# Patient Record
Sex: Female | Born: 1935 | Race: White | Hispanic: No | Smoking: Former smoker
Health system: Southern US, Community
[De-identification: ages and names within clinical notes are randomized; demographics above are authoritative.]

## PROBLEM LIST (undated history)

## (undated) DIAGNOSIS — I2510 Atherosclerotic heart disease of native coronary artery without angina pectoris: Secondary | ICD-10-CM

## (undated) DIAGNOSIS — I739 Peripheral vascular disease, unspecified: Secondary | ICD-10-CM

## (undated) DIAGNOSIS — Z9981 Dependence on supplemental oxygen: Secondary | ICD-10-CM

## (undated) DIAGNOSIS — M549 Dorsalgia, unspecified: Secondary | ICD-10-CM

## (undated) DIAGNOSIS — F329 Major depressive disorder, single episode, unspecified: Secondary | ICD-10-CM

## (undated) DIAGNOSIS — F32A Depression, unspecified: Secondary | ICD-10-CM

## (undated) DIAGNOSIS — R0789 Other chest pain: Secondary | ICD-10-CM

## (undated) DIAGNOSIS — I10 Essential (primary) hypertension: Secondary | ICD-10-CM

## (undated) DIAGNOSIS — C4492 Squamous cell carcinoma of skin, unspecified: Secondary | ICD-10-CM

## (undated) DIAGNOSIS — R413 Other amnesia: Secondary | ICD-10-CM

## (undated) DIAGNOSIS — E785 Hyperlipidemia, unspecified: Secondary | ICD-10-CM

## (undated) DIAGNOSIS — E039 Hypothyroidism, unspecified: Secondary | ICD-10-CM

## (undated) DIAGNOSIS — E119 Type 2 diabetes mellitus without complications: Secondary | ICD-10-CM

## (undated) DIAGNOSIS — R296 Repeated falls: Secondary | ICD-10-CM

## (undated) DIAGNOSIS — E1140 Type 2 diabetes mellitus with diabetic neuropathy, unspecified: Secondary | ICD-10-CM

## (undated) DIAGNOSIS — R410 Disorientation, unspecified: Secondary | ICD-10-CM

## (undated) DIAGNOSIS — K279 Peptic ulcer, site unspecified, unspecified as acute or chronic, without hemorrhage or perforation: Secondary | ICD-10-CM

## (undated) DIAGNOSIS — M1990 Unspecified osteoarthritis, unspecified site: Secondary | ICD-10-CM

## (undated) HISTORY — DX: Atherosclerotic heart disease of native coronary artery without angina pectoris: I25.10

## (undated) HISTORY — DX: Other chest pain: R07.89

## (undated) HISTORY — DX: Squamous cell carcinoma of skin, unspecified: C44.92

## (undated) HISTORY — DX: Type 2 diabetes mellitus without complications: E11.9

## (undated) HISTORY — DX: Peripheral vascular disease, unspecified: I73.9

## (undated) HISTORY — DX: Peptic ulcer, site unspecified, unspecified as acute or chronic, without hemorrhage or perforation: K27.9

## (undated) HISTORY — PX: CARDIAC CATHETERIZATION: SHX172

## (undated) HISTORY — PX: APPENDECTOMY: SHX54

## (undated) HISTORY — DX: Hyperlipidemia, unspecified: E78.5

## (undated) HISTORY — DX: Dorsalgia, unspecified: M54.9

## (undated) HISTORY — DX: Essential (primary) hypertension: I10

## (undated) HISTORY — DX: Type 2 diabetes mellitus with diabetic neuropathy, unspecified: E11.40

## (undated) HISTORY — PX: TONSILLECTOMY: SUR1361

## (undated) HISTORY — DX: Hypothyroidism, unspecified: E03.9

## (undated) HISTORY — PX: CATARACT EXTRACTION: SUR2

## (undated) HISTORY — DX: Major depressive disorder, single episode, unspecified: F32.9

## (undated) HISTORY — DX: Depression, unspecified: F32.A

---

## 1999-07-25 HISTORY — PX: CORONARY ARTERY BYPASS GRAFT: SHX141

## 2000-03-28 ENCOUNTER — Emergency Department (HOSPITAL_COMMUNITY): Admission: EM | Admit: 2000-03-28 | Discharge: 2000-03-28 | Payer: Self-pay | Admitting: Emergency Medicine

## 2000-03-28 ENCOUNTER — Encounter: Payer: Self-pay | Admitting: Emergency Medicine

## 2000-04-11 ENCOUNTER — Other Ambulatory Visit: Admission: RE | Admit: 2000-04-11 | Discharge: 2000-04-11 | Payer: Self-pay | Admitting: Obstetrics & Gynecology

## 2001-05-27 ENCOUNTER — Other Ambulatory Visit: Admission: RE | Admit: 2001-05-27 | Discharge: 2001-05-27 | Payer: Self-pay | Admitting: Obstetrics & Gynecology

## 2001-06-26 ENCOUNTER — Ambulatory Visit (HOSPITAL_COMMUNITY): Admission: RE | Admit: 2001-06-26 | Discharge: 2001-06-27 | Payer: Self-pay | Admitting: Cardiovascular Disease

## 2001-07-04 ENCOUNTER — Encounter: Payer: Self-pay | Admitting: Emergency Medicine

## 2001-07-04 ENCOUNTER — Inpatient Hospital Stay (HOSPITAL_COMMUNITY): Admission: EM | Admit: 2001-07-04 | Discharge: 2001-07-09 | Payer: Self-pay | Admitting: Emergency Medicine

## 2002-07-24 HISTORY — PX: CORONARY ANGIOPLASTY WITH STENT PLACEMENT: SHX49

## 2002-12-23 ENCOUNTER — Encounter: Payer: Self-pay | Admitting: Internal Medicine

## 2003-04-10 ENCOUNTER — Encounter: Payer: Self-pay | Admitting: Cardiology

## 2003-04-10 ENCOUNTER — Inpatient Hospital Stay (HOSPITAL_COMMUNITY): Admission: EM | Admit: 2003-04-10 | Discharge: 2003-04-15 | Payer: Self-pay | Admitting: Emergency Medicine

## 2003-04-12 ENCOUNTER — Encounter: Payer: Self-pay | Admitting: *Deleted

## 2003-04-13 ENCOUNTER — Encounter: Payer: Self-pay | Admitting: Cardiovascular Disease

## 2003-05-18 ENCOUNTER — Encounter (HOSPITAL_COMMUNITY): Admission: RE | Admit: 2003-05-18 | Discharge: 2003-08-16 | Payer: Self-pay | Admitting: Cardiovascular Disease

## 2003-07-28 ENCOUNTER — Encounter: Admission: RE | Admit: 2003-07-28 | Discharge: 2003-07-28 | Payer: Self-pay | Admitting: Cardiovascular Disease

## 2003-08-25 ENCOUNTER — Encounter (HOSPITAL_COMMUNITY): Admission: RE | Admit: 2003-08-25 | Discharge: 2003-09-22 | Payer: Self-pay | Admitting: Cardiovascular Disease

## 2003-12-04 ENCOUNTER — Inpatient Hospital Stay (HOSPITAL_COMMUNITY): Admission: EM | Admit: 2003-12-04 | Discharge: 2003-12-08 | Payer: Self-pay | Admitting: Emergency Medicine

## 2004-06-02 ENCOUNTER — Ambulatory Visit: Payer: Self-pay | Admitting: Family Medicine

## 2004-07-27 ENCOUNTER — Ambulatory Visit: Payer: Self-pay | Admitting: Cardiovascular Disease

## 2004-08-18 ENCOUNTER — Emergency Department (HOSPITAL_COMMUNITY): Admission: EM | Admit: 2004-08-18 | Discharge: 2004-08-18 | Payer: Self-pay

## 2004-09-05 ENCOUNTER — Ambulatory Visit: Payer: Self-pay | Admitting: Family Medicine

## 2004-11-25 ENCOUNTER — Ambulatory Visit: Payer: Self-pay | Admitting: Family Medicine

## 2005-01-19 ENCOUNTER — Ambulatory Visit: Payer: Self-pay | Admitting: Cardiovascular Disease

## 2005-02-06 ENCOUNTER — Ambulatory Visit: Payer: Self-pay

## 2005-02-22 ENCOUNTER — Ambulatory Visit: Payer: Self-pay | Admitting: Cardiovascular Disease

## 2005-02-24 ENCOUNTER — Ambulatory Visit: Payer: Self-pay | Admitting: Cardiology

## 2005-03-21 ENCOUNTER — Ambulatory Visit: Payer: Self-pay | Admitting: Cardiology

## 2005-04-01 ENCOUNTER — Encounter: Admission: RE | Admit: 2005-04-01 | Discharge: 2005-04-01 | Payer: Self-pay | Admitting: Orthopedic Surgery

## 2005-04-05 ENCOUNTER — Encounter: Admission: RE | Admit: 2005-04-05 | Discharge: 2005-04-05 | Payer: Self-pay | Admitting: Orthopedic Surgery

## 2005-04-20 ENCOUNTER — Ambulatory Visit: Payer: Self-pay | Admitting: Cardiovascular Disease

## 2005-05-01 ENCOUNTER — Encounter: Admission: RE | Admit: 2005-05-01 | Discharge: 2005-05-01 | Payer: Self-pay | Admitting: Orthopedic Surgery

## 2005-05-09 ENCOUNTER — Ambulatory Visit: Payer: Self-pay | Admitting: Family Medicine

## 2005-05-18 ENCOUNTER — Ambulatory Visit: Payer: Self-pay | Admitting: Family Medicine

## 2005-08-10 ENCOUNTER — Ambulatory Visit: Payer: Self-pay | Admitting: Cardiovascular Disease

## 2005-09-12 ENCOUNTER — Ambulatory Visit: Payer: Self-pay | Admitting: Cardiovascular Disease

## 2005-12-20 ENCOUNTER — Ambulatory Visit: Payer: Self-pay | Admitting: Cardiovascular Disease

## 2005-12-28 ENCOUNTER — Ambulatory Visit: Payer: Self-pay | Admitting: Family Medicine

## 2006-01-10 ENCOUNTER — Ambulatory Visit: Payer: Self-pay

## 2006-02-21 ENCOUNTER — Emergency Department (HOSPITAL_COMMUNITY): Admission: EM | Admit: 2006-02-21 | Discharge: 2006-02-21 | Payer: Self-pay | Admitting: Emergency Medicine

## 2006-02-22 ENCOUNTER — Ambulatory Visit: Payer: Self-pay | Admitting: Internal Medicine

## 2006-03-01 ENCOUNTER — Ambulatory Visit: Payer: Self-pay | Admitting: Family Medicine

## 2006-03-15 ENCOUNTER — Ambulatory Visit: Payer: Self-pay | Admitting: Cardiovascular Disease

## 2006-03-23 ENCOUNTER — Ambulatory Visit: Payer: Self-pay

## 2006-04-26 ENCOUNTER — Ambulatory Visit: Payer: Self-pay | Admitting: Family Medicine

## 2006-05-03 ENCOUNTER — Ambulatory Visit: Payer: Self-pay | Admitting: Family Medicine

## 2006-05-29 ENCOUNTER — Encounter: Admission: RE | Admit: 2006-05-29 | Discharge: 2006-05-29 | Payer: Self-pay | Admitting: Orthopedic Surgery

## 2006-06-07 ENCOUNTER — Ambulatory Visit: Payer: Self-pay | Admitting: Family Medicine

## 2006-07-02 ENCOUNTER — Ambulatory Visit: Payer: Self-pay | Admitting: Family Medicine

## 2006-07-05 ENCOUNTER — Ambulatory Visit (HOSPITAL_COMMUNITY): Admission: RE | Admit: 2006-07-05 | Discharge: 2006-07-05 | Payer: Self-pay | Admitting: Family Medicine

## 2006-10-24 ENCOUNTER — Encounter: Admission: RE | Admit: 2006-10-24 | Discharge: 2006-10-24 | Payer: Self-pay | Admitting: Orthopedic Surgery

## 2006-12-18 ENCOUNTER — Ambulatory Visit: Payer: Self-pay | Admitting: Cardiology

## 2006-12-18 ENCOUNTER — Inpatient Hospital Stay (HOSPITAL_COMMUNITY): Admission: EM | Admit: 2006-12-18 | Discharge: 2006-12-19 | Payer: Self-pay | Admitting: Emergency Medicine

## 2006-12-21 ENCOUNTER — Ambulatory Visit: Payer: Self-pay | Admitting: Family Medicine

## 2007-01-02 ENCOUNTER — Ambulatory Visit: Payer: Self-pay | Admitting: Cardiology

## 2007-03-04 ENCOUNTER — Ambulatory Visit: Payer: Self-pay | Admitting: Family Medicine

## 2007-03-04 DIAGNOSIS — K279 Peptic ulcer, site unspecified, unspecified as acute or chronic, without hemorrhage or perforation: Secondary | ICD-10-CM

## 2007-03-04 DIAGNOSIS — I2510 Atherosclerotic heart disease of native coronary artery without angina pectoris: Secondary | ICD-10-CM | POA: Insufficient documentation

## 2007-03-04 DIAGNOSIS — J209 Acute bronchitis, unspecified: Secondary | ICD-10-CM

## 2007-03-04 DIAGNOSIS — I739 Peripheral vascular disease, unspecified: Secondary | ICD-10-CM

## 2007-03-04 DIAGNOSIS — F329 Major depressive disorder, single episode, unspecified: Secondary | ICD-10-CM

## 2007-03-04 DIAGNOSIS — I10 Essential (primary) hypertension: Secondary | ICD-10-CM

## 2007-03-27 ENCOUNTER — Encounter: Payer: Self-pay | Admitting: Family Medicine

## 2007-04-23 ENCOUNTER — Encounter: Payer: Self-pay | Admitting: Family Medicine

## 2007-05-08 ENCOUNTER — Ambulatory Visit: Payer: Self-pay | Admitting: Family Medicine

## 2007-06-03 ENCOUNTER — Ambulatory Visit: Payer: Self-pay | Admitting: Cardiovascular Disease

## 2007-06-13 ENCOUNTER — Encounter: Admission: RE | Admit: 2007-06-13 | Discharge: 2007-06-13 | Payer: Self-pay | Admitting: Orthopedic Surgery

## 2007-07-29 ENCOUNTER — Telehealth: Payer: Self-pay | Admitting: Family Medicine

## 2007-07-31 ENCOUNTER — Ambulatory Visit: Payer: Self-pay | Admitting: Family Medicine

## 2007-07-31 DIAGNOSIS — E119 Type 2 diabetes mellitus without complications: Secondary | ICD-10-CM | POA: Insufficient documentation

## 2007-07-31 DIAGNOSIS — E785 Hyperlipidemia, unspecified: Secondary | ICD-10-CM | POA: Insufficient documentation

## 2007-08-01 LAB — CONVERTED CEMR LAB
ALT: 16 units/L (ref 0–35)
AST: 20 units/L (ref 0–37)
Albumin: 3.9 g/dL (ref 3.5–5.2)
Alkaline Phosphatase: 38 units/L — ABNORMAL LOW (ref 39–117)
BUN: 27 mg/dL — ABNORMAL HIGH (ref 6–23)
Basophils Absolute: 0 10*3/uL (ref 0.0–0.1)
Basophils Relative: 0.1 % (ref 0.0–1.0)
Bilirubin, Direct: 0.2 mg/dL (ref 0.0–0.3)
CO2: 28 meq/L (ref 19–32)
Calcium: 9.7 mg/dL (ref 8.4–10.5)
Chloride: 106 meq/L (ref 96–112)
Cholesterol: 193 mg/dL (ref 0–200)
Creatinine, Ser: 1.4 mg/dL — ABNORMAL HIGH (ref 0.4–1.2)
Eosinophils Absolute: 0.3 10*3/uL (ref 0.0–0.6)
Eosinophils Relative: 4.5 % (ref 0.0–5.0)
GFR calc Af Amer: 48 mL/min
GFR calc non Af Amer: 39 mL/min
Glucose, Bld: 116 mg/dL — ABNORMAL HIGH (ref 70–99)
HCT: 34.4 % — ABNORMAL LOW (ref 36.0–46.0)
HDL: 38.4 mg/dL — ABNORMAL LOW (ref >39.0)
Hemoglobin: 12.1 g/dL (ref 12.0–15.0)
Hgb A1c MFr Bld: 6.7 % — ABNORMAL HIGH (ref 4.6–6.0)
LDL Cholesterol: 115 mg/dL — ABNORMAL HIGH (ref 0–99)
Lymphocytes Relative: 38.2 % (ref 12.0–46.0)
MCHC: 35.3 g/dL (ref 30.0–36.0)
MCV: 81.9 fL (ref 78.0–100.0)
Monocytes Absolute: 0.4 10*3/uL (ref 0.2–0.7)
Monocytes Relative: 7.2 % (ref 3.0–11.0)
Neutro Abs: 3.1 10*3/uL (ref 1.4–7.7)
Neutrophils Relative %: 50 % (ref 43.0–77.0)
Platelets: 351 10*3/uL (ref 150–400)
Potassium: 4.5 meq/L (ref 3.5–5.1)
RBC: 4.2 M/uL (ref 3.87–5.11)
RDW: 13.8 % (ref 11.5–14.6)
Sodium: 141 meq/L (ref 135–145)
TSH: 5.74 microintl units/mL — ABNORMAL HIGH (ref 0.35–5.50)
Total Bilirubin: 0.6 mg/dL (ref 0.3–1.2)
Total CHOL/HDL Ratio: 5
Total Protein: 6.2 g/dL (ref 6.0–8.3)
Triglycerides: 196 mg/dL — ABNORMAL HIGH (ref 0–149)
VLDL: 39 mg/dL (ref 0–40)
WBC: 6.2 10*3/uL (ref 4.5–10.5)

## 2007-09-25 ENCOUNTER — Encounter: Payer: Self-pay | Admitting: Family Medicine

## 2007-09-30 ENCOUNTER — Ambulatory Visit: Payer: Self-pay | Admitting: Family Medicine

## 2007-10-29 ENCOUNTER — Encounter: Payer: Self-pay | Admitting: Family Medicine

## 2007-11-15 ENCOUNTER — Telehealth: Payer: Self-pay | Admitting: Family Medicine

## 2007-11-20 ENCOUNTER — Ambulatory Visit: Payer: Self-pay | Admitting: Family Medicine

## 2007-11-22 LAB — CONVERTED CEMR LAB: TSH: 3.75 microintl units/mL (ref 0.35–5.50)

## 2007-12-03 ENCOUNTER — Ambulatory Visit: Payer: Self-pay | Admitting: Cardiovascular Disease

## 2007-12-20 ENCOUNTER — Encounter: Payer: Self-pay | Admitting: Family Medicine

## 2007-12-20 ENCOUNTER — Ambulatory Visit: Payer: Self-pay

## 2008-02-11 ENCOUNTER — Ambulatory Visit: Payer: Self-pay | Admitting: Internal Medicine

## 2008-02-11 DIAGNOSIS — M549 Dorsalgia, unspecified: Secondary | ICD-10-CM | POA: Insufficient documentation

## 2008-02-11 DIAGNOSIS — Z860100 Personal history of colon polyps, unspecified: Secondary | ICD-10-CM | POA: Insufficient documentation

## 2008-02-11 DIAGNOSIS — K5730 Diverticulosis of large intestine without perforation or abscess without bleeding: Secondary | ICD-10-CM | POA: Insufficient documentation

## 2008-02-19 ENCOUNTER — Ambulatory Visit (HOSPITAL_COMMUNITY): Admission: RE | Admit: 2008-02-19 | Discharge: 2008-02-19 | Payer: Self-pay | Admitting: Internal Medicine

## 2008-02-19 ENCOUNTER — Encounter: Payer: Self-pay | Admitting: Family Medicine

## 2008-02-20 ENCOUNTER — Encounter: Payer: Self-pay | Admitting: Internal Medicine

## 2008-02-20 HISTORY — PX: COLONOSCOPY: SHX174

## 2008-02-25 ENCOUNTER — Ambulatory Visit: Payer: Self-pay | Admitting: Internal Medicine

## 2008-04-23 ENCOUNTER — Ambulatory Visit: Payer: Self-pay | Admitting: Family Medicine

## 2008-04-29 ENCOUNTER — Encounter: Payer: Self-pay | Admitting: Family Medicine

## 2008-05-22 ENCOUNTER — Encounter: Admission: RE | Admit: 2008-05-22 | Discharge: 2008-05-22 | Payer: Self-pay | Admitting: Orthopedic Surgery

## 2008-06-03 ENCOUNTER — Ambulatory Visit: Payer: Self-pay | Admitting: Cardiovascular Disease

## 2008-06-24 ENCOUNTER — Telehealth: Payer: Self-pay | Admitting: Family Medicine

## 2008-07-13 ENCOUNTER — Encounter: Payer: Self-pay | Admitting: Family Medicine

## 2008-08-12 ENCOUNTER — Ambulatory Visit: Payer: Self-pay | Admitting: Family Medicine

## 2008-08-12 DIAGNOSIS — M545 Low back pain: Secondary | ICD-10-CM

## 2008-09-30 ENCOUNTER — Telehealth: Payer: Self-pay | Admitting: Family Medicine

## 2008-10-01 ENCOUNTER — Ambulatory Visit: Payer: Self-pay | Admitting: Cardiovascular Disease

## 2008-10-07 DIAGNOSIS — R0789 Other chest pain: Secondary | ICD-10-CM | POA: Insufficient documentation

## 2008-10-08 ENCOUNTER — Ambulatory Visit: Payer: Self-pay | Admitting: Cardiovascular Disease

## 2008-11-01 ENCOUNTER — Ambulatory Visit: Payer: Self-pay | Admitting: Cardiology

## 2008-11-01 ENCOUNTER — Inpatient Hospital Stay (HOSPITAL_COMMUNITY): Admission: EM | Admit: 2008-11-01 | Discharge: 2008-11-05 | Payer: Self-pay | Admitting: Emergency Medicine

## 2008-11-05 ENCOUNTER — Encounter: Payer: Self-pay | Admitting: Family Medicine

## 2008-11-10 ENCOUNTER — Ambulatory Visit: Payer: Self-pay | Admitting: Family Medicine

## 2008-11-10 LAB — CONVERTED CEMR LAB
Free T4: 1.7 ng/dL — ABNORMAL HIGH (ref 0.6–1.6)
T3, Free: 3.1 pg/mL (ref 2.3–4.2)
TSH: 0.01 microintl units/mL — ABNORMAL LOW (ref 0.35–5.50)

## 2008-11-11 ENCOUNTER — Telehealth: Payer: Self-pay | Admitting: *Deleted

## 2008-11-19 ENCOUNTER — Ambulatory Visit: Payer: Self-pay | Admitting: Cardiovascular Disease

## 2008-11-19 DIAGNOSIS — E0590 Thyrotoxicosis, unspecified without thyrotoxic crisis or storm: Secondary | ICD-10-CM | POA: Insufficient documentation

## 2008-11-23 ENCOUNTER — Ambulatory Visit: Payer: Self-pay | Admitting: Endocrinology

## 2008-11-23 LAB — CONVERTED CEMR LAB
Free T4: 1.4 ng/dL (ref 0.6–1.6)
TSH: 0.02 microintl units/mL — ABNORMAL LOW (ref 0.35–5.50)

## 2008-11-24 ENCOUNTER — Telehealth (INDEPENDENT_AMBULATORY_CARE_PROVIDER_SITE_OTHER): Payer: Self-pay | Admitting: *Deleted

## 2008-11-25 ENCOUNTER — Encounter: Payer: Self-pay | Admitting: Cardiovascular Disease

## 2008-11-25 ENCOUNTER — Telehealth: Payer: Self-pay | Admitting: Cardiovascular Disease

## 2008-12-02 ENCOUNTER — Ambulatory Visit: Payer: Self-pay | Admitting: Internal Medicine

## 2008-12-11 ENCOUNTER — Encounter: Payer: Self-pay | Admitting: Cardiovascular Disease

## 2008-12-11 ENCOUNTER — Encounter: Payer: Self-pay | Admitting: Endocrinology

## 2008-12-14 ENCOUNTER — Ambulatory Visit: Payer: Self-pay | Admitting: Endocrinology

## 2008-12-14 LAB — CONVERTED CEMR LAB
Free T4: 0.9 ng/dL (ref 0.6–1.6)
TSH: 0.1 microintl units/mL — ABNORMAL LOW (ref 0.35–5.50)

## 2008-12-16 ENCOUNTER — Encounter: Payer: Self-pay | Admitting: Cardiovascular Disease

## 2008-12-23 ENCOUNTER — Telehealth (INDEPENDENT_AMBULATORY_CARE_PROVIDER_SITE_OTHER): Payer: Self-pay | Admitting: *Deleted

## 2008-12-24 ENCOUNTER — Encounter: Payer: Self-pay | Admitting: Cardiovascular Disease

## 2008-12-25 ENCOUNTER — Encounter: Payer: Self-pay | Admitting: Cardiovascular Disease

## 2008-12-30 ENCOUNTER — Encounter: Payer: Self-pay | Admitting: Cardiovascular Disease

## 2008-12-30 ENCOUNTER — Telehealth: Payer: Self-pay | Admitting: Cardiovascular Disease

## 2008-12-31 ENCOUNTER — Telehealth: Payer: Self-pay | Admitting: Cardiovascular Disease

## 2009-01-14 ENCOUNTER — Ambulatory Visit: Payer: Self-pay | Admitting: Endocrinology

## 2009-01-14 LAB — CONVERTED CEMR LAB
Free T4: 0.8 ng/dL (ref 0.6–1.6)
TSH: 7.61 microintl units/mL — ABNORMAL HIGH (ref 0.35–5.50)

## 2009-01-16 ENCOUNTER — Encounter: Payer: Self-pay | Admitting: Cardiovascular Disease

## 2009-01-20 ENCOUNTER — Encounter: Payer: Self-pay | Admitting: Cardiovascular Disease

## 2009-01-21 ENCOUNTER — Encounter: Payer: Self-pay | Admitting: Cardiovascular Disease

## 2009-01-27 ENCOUNTER — Encounter: Payer: Self-pay | Admitting: Cardiovascular Disease

## 2009-02-08 ENCOUNTER — Ambulatory Visit: Payer: Self-pay | Admitting: Cardiovascular Disease

## 2009-02-09 ENCOUNTER — Telehealth: Payer: Self-pay | Admitting: Cardiovascular Disease

## 2009-02-10 ENCOUNTER — Encounter: Payer: Self-pay | Admitting: Cardiovascular Disease

## 2009-02-17 ENCOUNTER — Encounter: Payer: Self-pay | Admitting: Cardiovascular Disease

## 2009-02-17 ENCOUNTER — Encounter: Payer: Self-pay | Admitting: Family Medicine

## 2009-02-22 ENCOUNTER — Encounter: Payer: Self-pay | Admitting: Endocrinology

## 2009-02-25 ENCOUNTER — Encounter: Payer: Self-pay | Admitting: Cardiovascular Disease

## 2009-03-10 ENCOUNTER — Encounter: Payer: Self-pay | Admitting: Cardiovascular Disease

## 2009-03-24 ENCOUNTER — Encounter: Payer: Self-pay | Admitting: Cardiovascular Disease

## 2009-04-01 ENCOUNTER — Encounter: Payer: Self-pay | Admitting: Cardiovascular Disease

## 2009-04-02 ENCOUNTER — Ambulatory Visit: Payer: Self-pay | Admitting: Endocrinology

## 2009-04-02 LAB — CONVERTED CEMR LAB
Free T4: 0.6 ng/dL (ref 0.6–1.6)
TSH: 9.8 microintl units/mL — ABNORMAL HIGH (ref 0.35–5.50)

## 2009-04-08 ENCOUNTER — Encounter: Payer: Self-pay | Admitting: Cardiovascular Disease

## 2009-04-14 ENCOUNTER — Encounter: Payer: Self-pay | Admitting: Cardiovascular Disease

## 2009-04-21 ENCOUNTER — Encounter: Payer: Self-pay | Admitting: Cardiovascular Disease

## 2009-04-26 ENCOUNTER — Encounter: Payer: Self-pay | Admitting: Cardiovascular Disease

## 2009-05-18 ENCOUNTER — Ambulatory Visit: Payer: Self-pay | Admitting: Family Medicine

## 2009-05-19 ENCOUNTER — Encounter: Payer: Self-pay | Admitting: Cardiovascular Disease

## 2009-05-20 ENCOUNTER — Ambulatory Visit: Payer: Self-pay | Admitting: Cardiovascular Disease

## 2009-06-02 ENCOUNTER — Encounter: Payer: Self-pay | Admitting: Cardiovascular Disease

## 2009-06-15 ENCOUNTER — Encounter: Payer: Self-pay | Admitting: Cardiovascular Disease

## 2009-06-23 ENCOUNTER — Encounter: Payer: Self-pay | Admitting: Cardiovascular Disease

## 2009-06-30 ENCOUNTER — Encounter: Payer: Self-pay | Admitting: Cardiovascular Disease

## 2009-07-14 ENCOUNTER — Encounter: Payer: Self-pay | Admitting: Cardiovascular Disease

## 2009-07-19 ENCOUNTER — Encounter: Payer: Self-pay | Admitting: Cardiovascular Disease

## 2009-07-21 ENCOUNTER — Encounter: Payer: Self-pay | Admitting: Cardiovascular Disease

## 2009-07-28 ENCOUNTER — Encounter: Payer: Self-pay | Admitting: Cardiovascular Disease

## 2009-07-29 ENCOUNTER — Encounter: Payer: Self-pay | Admitting: Cardiovascular Disease

## 2009-07-30 ENCOUNTER — Ambulatory Visit: Payer: Self-pay | Admitting: Cardiovascular Disease

## 2009-08-11 ENCOUNTER — Encounter: Payer: Self-pay | Admitting: Cardiovascular Disease

## 2009-08-18 ENCOUNTER — Encounter: Payer: Self-pay | Admitting: Cardiovascular Disease

## 2009-08-25 ENCOUNTER — Encounter: Payer: Self-pay | Admitting: Cardiovascular Disease

## 2009-09-08 ENCOUNTER — Encounter: Payer: Self-pay | Admitting: Cardiovascular Disease

## 2009-09-15 ENCOUNTER — Encounter: Payer: Self-pay | Admitting: Cardiovascular Disease

## 2009-09-22 ENCOUNTER — Encounter: Payer: Self-pay | Admitting: Cardiovascular Disease

## 2009-09-24 ENCOUNTER — Encounter: Payer: Self-pay | Admitting: Cardiovascular Disease

## 2009-10-07 ENCOUNTER — Encounter: Payer: Self-pay | Admitting: Cardiovascular Disease

## 2009-10-20 ENCOUNTER — Encounter: Payer: Self-pay | Admitting: Cardiovascular Disease

## 2009-11-01 ENCOUNTER — Ambulatory Visit: Payer: Self-pay | Admitting: Cardiovascular Disease

## 2009-11-01 DIAGNOSIS — R0602 Shortness of breath: Secondary | ICD-10-CM

## 2009-11-03 ENCOUNTER — Encounter: Payer: Self-pay | Admitting: Cardiovascular Disease

## 2009-11-15 ENCOUNTER — Encounter: Payer: Self-pay | Admitting: Cardiovascular Disease

## 2009-11-19 ENCOUNTER — Telehealth: Payer: Self-pay | Admitting: Cardiovascular Disease

## 2009-11-22 ENCOUNTER — Encounter: Payer: Self-pay | Admitting: Cardiovascular Disease

## 2009-12-01 ENCOUNTER — Encounter: Payer: Self-pay | Admitting: Cardiovascular Disease

## 2009-12-09 ENCOUNTER — Encounter (INDEPENDENT_AMBULATORY_CARE_PROVIDER_SITE_OTHER): Payer: Self-pay | Admitting: *Deleted

## 2009-12-15 ENCOUNTER — Encounter: Payer: Self-pay | Admitting: Cardiovascular Disease

## 2009-12-29 ENCOUNTER — Encounter: Payer: Self-pay | Admitting: Cardiovascular Disease

## 2010-01-12 ENCOUNTER — Encounter: Payer: Self-pay | Admitting: Cardiovascular Disease

## 2010-01-23 ENCOUNTER — Encounter: Payer: Self-pay | Admitting: Cardiovascular Disease

## 2010-01-26 ENCOUNTER — Encounter: Payer: Self-pay | Admitting: Cardiovascular Disease

## 2010-02-08 ENCOUNTER — Ambulatory Visit: Payer: Self-pay | Admitting: Cardiovascular Disease

## 2010-02-09 ENCOUNTER — Encounter: Payer: Self-pay | Admitting: Cardiovascular Disease

## 2010-02-16 ENCOUNTER — Encounter: Payer: Self-pay | Admitting: Cardiovascular Disease

## 2010-02-23 ENCOUNTER — Encounter: Payer: Self-pay | Admitting: Cardiovascular Disease

## 2010-02-28 ENCOUNTER — Encounter: Payer: Self-pay | Admitting: Cardiovascular Disease

## 2010-03-07 ENCOUNTER — Encounter: Payer: Self-pay | Admitting: Cardiovascular Disease

## 2010-03-09 ENCOUNTER — Encounter: Payer: Self-pay | Admitting: Cardiovascular Disease

## 2010-03-16 ENCOUNTER — Encounter: Payer: Self-pay | Admitting: Cardiovascular Disease

## 2010-03-18 ENCOUNTER — Encounter (INDEPENDENT_AMBULATORY_CARE_PROVIDER_SITE_OTHER): Payer: Self-pay | Admitting: *Deleted

## 2010-03-21 ENCOUNTER — Encounter: Payer: Self-pay | Admitting: Cardiovascular Disease

## 2010-03-23 ENCOUNTER — Encounter: Payer: Self-pay | Admitting: Cardiovascular Disease

## 2010-04-06 ENCOUNTER — Encounter: Payer: Self-pay | Admitting: Cardiovascular Disease

## 2010-04-13 ENCOUNTER — Encounter: Payer: Self-pay | Admitting: Cardiovascular Disease

## 2010-05-04 ENCOUNTER — Encounter: Payer: Self-pay | Admitting: Cardiovascular Disease

## 2010-05-10 ENCOUNTER — Ambulatory Visit: Payer: Self-pay | Admitting: Cardiovascular Disease

## 2010-05-11 ENCOUNTER — Encounter: Payer: Self-pay | Admitting: Cardiovascular Disease

## 2010-05-16 ENCOUNTER — Encounter: Payer: Self-pay | Admitting: Cardiovascular Disease

## 2010-05-18 ENCOUNTER — Encounter: Payer: Self-pay | Admitting: Cardiovascular Disease

## 2010-05-27 ENCOUNTER — Encounter: Payer: Self-pay | Admitting: Cardiovascular Disease

## 2010-06-01 ENCOUNTER — Encounter: Payer: Self-pay | Admitting: Cardiovascular Disease

## 2010-06-14 ENCOUNTER — Encounter: Payer: Self-pay | Admitting: Cardiovascular Disease

## 2010-06-27 ENCOUNTER — Encounter: Payer: Self-pay | Admitting: Cardiovascular Disease

## 2010-06-30 ENCOUNTER — Encounter: Payer: Self-pay | Admitting: Cardiovascular Disease

## 2010-07-13 ENCOUNTER — Encounter: Payer: Self-pay | Admitting: Cardiovascular Disease

## 2010-07-26 ENCOUNTER — Encounter: Payer: Self-pay | Admitting: Cardiovascular Disease

## 2010-08-05 ENCOUNTER — Ambulatory Visit
Admission: RE | Admit: 2010-08-05 | Discharge: 2010-08-05 | Payer: Self-pay | Source: Home / Self Care | Attending: Family Medicine | Admitting: Family Medicine

## 2010-08-05 ENCOUNTER — Other Ambulatory Visit: Payer: Self-pay | Admitting: Family Medicine

## 2010-08-05 LAB — BASIC METABOLIC PANEL
BUN: 17 mg/dL (ref 6–23)
CO2: 28 mEq/L (ref 19–32)
Calcium: 9.4 mg/dL (ref 8.4–10.5)
Chloride: 104 mEq/L (ref 96–112)
Creatinine, Ser: 1 mg/dL (ref 0.4–1.2)
GFR: 58.26 mL/min — ABNORMAL LOW (ref >60.00)
Glucose, Bld: 129 mg/dL — ABNORMAL HIGH (ref 70–99)
Potassium: 4.8 mEq/L (ref 3.5–5.1)
Sodium: 140 mEq/L (ref 135–145)

## 2010-08-05 LAB — HEPATIC FUNCTION PANEL
ALT: 20 U/L (ref 0–35)
AST: 20 U/L (ref 0–37)
Albumin: 3.8 g/dL (ref 3.5–5.2)
Alkaline Phosphatase: 66 U/L (ref 39–117)
Bilirubin, Direct: 0.1 mg/dL (ref 0.0–0.3)
Total Bilirubin: 0.6 mg/dL (ref 0.3–1.2)
Total Protein: 6.6 g/dL (ref 6.0–8.3)

## 2010-08-05 LAB — LIPID PANEL
Cholesterol: 208 mg/dL — ABNORMAL HIGH (ref 0–200)
HDL: 38.5 mg/dL — ABNORMAL LOW (ref >39.00)
Total CHOL/HDL Ratio: 5
Triglycerides: 445 mg/dL — ABNORMAL HIGH (ref 0.0–149.0)
VLDL: 89 mg/dL — ABNORMAL HIGH (ref 0.0–40.0)

## 2010-08-05 LAB — CBC WITH DIFFERENTIAL/PLATELET
Basophils Absolute: 0 10*3/uL (ref 0.0–0.1)
Basophils Relative: 0.3 % (ref 0.0–3.0)
Eosinophils Absolute: 0.3 10*3/uL (ref 0.0–0.7)
Eosinophils Relative: 3.6 % (ref 0.0–5.0)
HCT: 38.4 % (ref 36.0–46.0)
Hemoglobin: 12.9 g/dL (ref 12.0–15.0)
Lymphocytes Relative: 31.3 % (ref 12.0–46.0)
Lymphs Abs: 2.8 10*3/uL (ref 0.7–4.0)
MCHC: 33.6 g/dL (ref 30.0–36.0)
MCV: 87.4 fl (ref 78.0–100.0)
Monocytes Absolute: 0.6 10*3/uL (ref 0.1–1.0)
Monocytes Relative: 6.5 % (ref 3.0–12.0)
Neutro Abs: 5.2 10*3/uL (ref 1.4–7.7)
Neutrophils Relative %: 58.3 % (ref 43.0–77.0)
Platelets: 370 10*3/uL (ref 150.0–400.0)
RBC: 4.39 Mil/uL (ref 3.87–5.11)
RDW: 14.9 % — ABNORMAL HIGH (ref 11.5–14.6)
WBC: 9 10*3/uL (ref 4.5–10.5)

## 2010-08-05 LAB — LDL CHOLESTEROL, DIRECT: Direct LDL: 83.8 mg/dL

## 2010-08-05 LAB — TSH: TSH: 4.15 u[IU]/mL (ref 0.35–5.50)

## 2010-08-05 LAB — HEMOGLOBIN A1C: Hgb A1c MFr Bld: 7.2 % — ABNORMAL HIGH (ref 4.6–6.5)

## 2010-08-10 ENCOUNTER — Encounter: Payer: Self-pay | Admitting: Cardiovascular Disease

## 2010-08-11 ENCOUNTER — Ambulatory Visit
Admission: RE | Admit: 2010-08-11 | Discharge: 2010-08-11 | Payer: Self-pay | Source: Home / Self Care | Attending: Cardiovascular Disease | Admitting: Cardiovascular Disease

## 2010-08-11 ENCOUNTER — Encounter (INDEPENDENT_AMBULATORY_CARE_PROVIDER_SITE_OTHER): Payer: Self-pay | Admitting: *Deleted

## 2010-08-14 ENCOUNTER — Encounter: Payer: Self-pay | Admitting: Neurology

## 2010-08-21 LAB — CONVERTED CEMR LAB
ALT: 16 units/L (ref 0–35)
AST: 19 units/L (ref 0–37)
Albumin: 3.7 g/dL (ref 3.5–5.2)
Alkaline Phosphatase: 45 units/L (ref 39–117)
BUN: 20 mg/dL (ref 6–23)
BUN: 21 mg/dL (ref 6–23)
Basophils Absolute: 0 10*3/uL (ref 0.0–0.1)
Basophils Relative: 0.6 % (ref 0.0–3.0)
Bilirubin, Direct: 0.1 mg/dL (ref 0.0–0.3)
CO2: 29 meq/L (ref 19–32)
CO2: 32 meq/L (ref 19–32)
Calcium: 9.2 mg/dL (ref 8.4–10.5)
Calcium: 9.7 mg/dL (ref 8.4–10.5)
Chloride: 105 meq/L (ref 96–112)
Chloride: 108 meq/L (ref 96–112)
Cholesterol: 129 mg/dL (ref 0–200)
Creatinine, Ser: 1.1 mg/dL (ref 0.4–1.2)
Creatinine, Ser: 1.3 mg/dL — ABNORMAL HIGH (ref 0.4–1.2)
Creatinine,U: 180.3 mg/dL
Eosinophils Absolute: 0.5 10*3/uL (ref 0.0–0.7)
Eosinophils Relative: 6.5 % — ABNORMAL HIGH (ref 0.0–5.0)
GFR calc Af Amer: 63 mL/min
GFR calc non Af Amer: 42.63 mL/min (ref >60)
GFR calc non Af Amer: 52 mL/min
Glucose, Bld: 146 mg/dL — ABNORMAL HIGH (ref 70–99)
Glucose, Bld: 166 mg/dL — ABNORMAL HIGH (ref 70–99)
HCT: 36.4 % (ref 36.0–46.0)
HDL: 36.9 mg/dL — ABNORMAL LOW (ref >39.0)
Hemoglobin: 12.1 g/dL (ref 12.0–15.0)
Hgb A1c MFr Bld: 7.3 % — ABNORMAL HIGH (ref 4.6–6.0)
LDL Cholesterol: 68 mg/dL (ref 0–99)
Lymphocytes Relative: 34.7 % (ref 12.0–46.0)
MCHC: 33.2 g/dL (ref 30.0–36.0)
MCV: 85.3 fL (ref 78.0–100.0)
Microalb Creat Ratio: 8.3 mg/g (ref 0.0–30.0)
Microalb, Ur: 1.5 mg/dL (ref 0.0–1.9)
Monocytes Absolute: 0.5 10*3/uL (ref 0.1–1.0)
Monocytes Relative: 6.9 % (ref 3.0–12.0)
Neutro Abs: 3.7 10*3/uL (ref 1.4–7.7)
Neutrophils Relative %: 51.3 % (ref 43.0–77.0)
Platelets: 387 10*3/uL (ref 150–400)
Potassium: 4.3 meq/L (ref 3.5–5.1)
Potassium: 4.4 meq/L (ref 3.5–5.1)
Pro B Natriuretic peptide (BNP): 124 pg/mL — ABNORMAL HIGH (ref 0.0–100.0)
RBC: 4.27 M/uL (ref 3.87–5.11)
RDW: 13 % (ref 11.5–14.6)
Sodium: 143 meq/L (ref 135–145)
Sodium: 143 meq/L (ref 135–145)
TSH: 0.03 microintl units/mL — ABNORMAL LOW (ref 0.35–5.50)
Total Bilirubin: 0.8 mg/dL (ref 0.3–1.2)
Total CHOL/HDL Ratio: 3.5
Total Protein: 6.9 g/dL (ref 6.0–8.3)
Triglycerides: 121 mg/dL (ref 0–149)
VLDL: 24 mg/dL (ref 0–40)
WBC: 7.2 10*3/uL (ref 4.5–10.5)

## 2010-08-22 ENCOUNTER — Encounter: Payer: Self-pay | Admitting: Cardiovascular Disease

## 2010-08-23 ENCOUNTER — Telehealth: Payer: Self-pay | Admitting: Cardiovascular Disease

## 2010-08-23 NOTE — Miscellaneous (Signed)
Summary: Hospice Certification/Treatment Plan   Hospice Certification/Treatment Plan   Imported By: Roderic Ovens 06/09/2010 17:40:27  _____________________________________________________________________  External Attachment:    Type:   Image     Comment:   External Document

## 2010-08-23 NOTE — Miscellaneous (Signed)
Summary: Hospice Certification/Treatment Plan   Hospice Certification/Treatment Plan   Imported By: Roderic Ovens 04/06/2010 11:40:30  _____________________________________________________________________  External Attachment:    Type:   Image     Comment:   External Document

## 2010-08-23 NOTE — Miscellaneous (Signed)
Summary: Hospice Care Plan/Physician Update   Hospice Care Plan/Physician Update   Imported By: Roderic Ovens 02/04/2010 12:53:36  _____________________________________________________________________  External Attachment:    Type:   Image     Comment:   External Document

## 2010-08-23 NOTE — Letter (Signed)
Summary: Hospice at [facility]   Imported By: Marylou Mccoy 01/14/2010 12:35:43  _____________________________________________________________________  External Attachment:    Type:   Image     Comment:   External Document

## 2010-08-23 NOTE — Miscellaneous (Signed)
Summary: Care Plan/Physician Update  Care Plan/Physician Update   Imported By: Roderic Ovens 08/19/2009 11:30:52  _____________________________________________________________________  External Attachment:    Type:   Image     Comment:   External Document

## 2010-08-23 NOTE — Letter (Signed)
Summary: Appointment - Reminder 2  [facility], Main Office  [address]   [place], [place] [address]   Phone: [phone]  Fax: [phone]     Dec 09, 2009 MRN: [id number]   [facility] [address] [place], [place]  [address]   Dear Ms. Braud,  Our records indicate that it is time to schedule a follow-up appointment with Dr. Eden Emms. It is very important that we reach you to schedule this appointment. We look forward to participating in your health care needs. Please contact us at the number listed above at your earliest convenience to schedule your appointment.  If you are unable to make an appointment at this time, give [place] a call so we can update our records.     Sincerely,  Migdalia Dk [facility] Scheduling Team

## 2010-08-23 NOTE — Miscellaneous (Signed)
Summary: Care Plan/Physician Update  Care Plan/Physician Update   Imported By: Roderic Ovens 10/05/2009 10:08:17  _____________________________________________________________________  External Attachment:    Type:   Image     Comment:   External Document

## 2010-08-23 NOTE — Miscellaneous (Signed)
Summary: [facility] Physician Consultation   [facility] Physician Consultation   Imported By: Roderic Ovens 01/28/2010 15:41:07  _____________________________________________________________________  External Attachment:    Type:   Image     Comment:   External Document

## 2010-08-23 NOTE — Miscellaneous (Signed)
Summary: Hospice [facility] Physician Consult   Hospice [facility] Physician Consult   Imported By: Roderic Ovens 05/23/2010 11:20:24  _____________________________________________________________________  External Attachment:    Type:   Image     Comment:   External Document

## 2010-08-23 NOTE — Miscellaneous (Signed)
Summary: Care Plan Update/ Physician Update  Care Plan Update/ Physician Update   Imported By: Marylou Mccoy 06/20/2010 10:45:07  _____________________________________________________________________  External Attachment:    Type:   Image     Comment:   External Document

## 2010-08-23 NOTE — Miscellaneous (Signed)
Summary: Care Plan/Physician Update  Care Plan/Physician Update   Imported By: Roderic Ovens 10/08/2009 15:29:56  _____________________________________________________________________  External Attachment:    Type:   Image     Comment:   External Document

## 2010-08-23 NOTE — Miscellaneous (Signed)
Summary: Care Plan/Physician Update  Care Plan/Physician Update   Imported By: Roderic Ovens 08/16/2009 13:43:31  _____________________________________________________________________  External Attachment:    Type:   Image     Comment:   External Document

## 2010-08-23 NOTE — Miscellaneous (Signed)
Summary: Hospice Care Plan/Physician Update  Hospice Care Plan/Physician Update   Imported By: Roderic Ovens 11/11/2009 10:28:37  _____________________________________________________________________  External Attachment:    Type:   Image     Comment:   External Document

## 2010-08-23 NOTE — Assessment & Plan Note (Signed)
Summary: U0A   Primary Provider:  Gershon Crane, MD  CC:  chest pain and sob .  History of Present Illness: Carla Friedman is seen today for CAD, HTN, and elevated lipids.  She has had CABG with stents to the native LAD and SVG to OM.  She has distal disease and chronic chest pain.  She actually is seen by hospice.  She prefers not to be seen in hospital alot.    She has less chest pain off lisinopril and on Atenolol.  We have not started Ranexa.  Her last cath was in 11/2006 and she had a non-ischemic myovue in 11/2007.  She has morphine and oxygen at home.  She has sig. depression.  She looked very good today  I am not convinced that all of her pains are anginal but she seems satisfied to have oxygen at home.    She also complains of SOB.  it does not sound like volume there is no PND, orthopnea, or edema. She continues to be depressed.  She uses oxygen and nitro as a crutch but this has helped her over the years.  Current Problems (verified): 1)  Hyperthyroidism  (ICD-242.90) 2)  Chest Pain, Atypical  (ICD-786.59) 3)  Low Back Pain  (ICD-724.2) 4)  Colonic Polyps, Adenomatous, Hx of  (ICD-V12.72) 5)  Diverticulosis of Colon  (ICD-562.10) 6)  Back Pain, Chronic  (ICD-724.5) 7)  Hyperlipidemia  (ICD-272.4) 8)  Diabetes Mellitus, Type II  (ICD-250.00) 9)  Bronchitis, Acute  (ICD-466.0) 10)  Family History Diabetes 1st Degree Relative  (ICD-V18.0) 11)  Depression  (ICD-311) 12)  Peptic Ulcer Disease  (ICD-533.90) 13)  Hypertension  (ICD-401.9) 14)  Peripheral Vascular Disease  (ICD-443.9) 15)  Coronary Artery Disease  (ICD-414.00)  Current Medications (verified): 1)  Isosorbide Mononitrate Cr 30 Mg Tb24 (Isosorbide Mononitrate) .Marland Kitchen.. 1 Tab By Mouth Two Times A Day 2)  Lantus 100 Unit/ml Soln (Insulin Glargine) .Marland Kitchen.. 10 Units At Bedtime 3)  Lipitor 20 Mg Tabs (Atorvastatin Calcium) .Marland Kitchen.. 1 By Mouth Once Daily 4)  Metformin Hcl 1000 Mg Tabs (Metformin Hcl) .Marland Kitchen.. 1 Tab By Mouth Once Daily 5)  Plavix  75 Mg Tabs (Clopidogrel Bisulfate) .Marland Kitchen.. 1 By Mouth Once Daily 6)  Tricor 145 Mg Tabs (Fenofibrate) .Marland Kitchen.. 1 By Mouth Once Daily 7)  Multivitamins   Tabs (Multiple Vitamin) .Marland Kitchen.. 1 By Mouth Once Daily 8)  Calcium 600 Mg  Tabs (Calcium) .... 2 By Mouth Once Daily 9)  Amitriptyline Hcl 50 Mg Tabs (Amitriptyline Hcl) .... Take 1 Tab By Mouth At Bedtime 10)  Bee Pollen 500 Mg  Tabs (Bee Pollen) .Marland Kitchen.. 1 By Mouth Once Daily 11)  Vitamin C Cr 500 Mg Cr-Caps (Ascorbic Acid) .... Once Daily 12)  Ramipril 10 Mg Caps (Ramipril) .Marland Kitchen.. 1 Tab Qam 13)  Atenolol 50 Mg Tabs (Atenolol) .... One Tablet Daily 14)  Promethazine Hcl 25 Mg Tabs (Promethazine Hcl) .... As Needed 15)  Oxycodone Hcl 5 Mg Tabs (Oxycodone Hcl) .... As Needed 16)  Aspirin 81 Mg  Tabs (Aspirin) .... Daily 17)  Nitrostat 0.4 Mg Subl (Nitroglycerin) .... As Needed 18)  Fish Oil   Oil (Fish Oil) .Marland Kitchen.. 1 Tab By Mouth Once Daily 19)  Methimazole 10 Mg Tabs (Methimazole) .Marland Kitchen.. 1 Tab By Mouth Once Daily 20)  Vitamin D 1000 Unit Tabs (Cholecalciferol) .Marland Kitchen.. 1 Tab By Mouth Once Daily  Allergies (verified): 1)  ! Hydrochlorothiazide 2)  ! * Oxycodone 3)  ! * Omeprazole 4)  ! Doxycycline 5)  ! *  Niaspan 6)  ! Xanax 7)  ! Cephalexin 8)  ! Amoxicillin 9)  ! * Propoxyphene 10)  ! Morphine 11)  ! Codeine 12)  Codeine Phosphate (Codeine Phosphate) 13)  [place] (Morphine Sulfate)  Past History:  Past Medical History: Last updated: 10/07/2008 Coronary artery disease, sees Dr. Eden Emms Chest pain, atypical Peripheral vascular disease Hypertension Dyslipidemia Diabetes Type II Peptic ulcer disease Depression Diabetic nueropathy Hypothyroidism Low back pain normal Adenosine Myoview 12-20-07 Squamous cell skin cancer, sees Dr. Isaac Laud at [facility] dermatology sees Julio Sicks at Dr. Donnetta Hail office for GYN exams normal DEXA 2009  Past Surgical History: Last updated: 10/07/2008 Appendectomy Tonsillectomy double bypass heart surgery  -stent placement 2001 Cataract extraction colonoscopy 02-19-08 per Dr. Juanda Chance, diverticulosis, no polyps, repeat in 10 yrs CABG 2001  Family History: Last updated: 02/11/2008 Fam hx MI: Father Fam hx Emphysema: Mother Family History of Arthritis Family History Diabetes 1st degree relative: Father, Paternal Grandfather Family History High cholesterol Family History Hypertension Family History of Cardiovascular disorder Family History Other cancer-breast-Maternal Grandmother  Social History: Last updated: 07/30/2009 Former Smoker Alcohol use-no Widowed Married x2  First husband DO/gay Drug use-no Regular exercise-no Retired lives near [place], who is here today. 4 children one died at age 52   Review of Systems       Denies fever, malais, weight loss, blurry vision, decreased visual acuity, cough, sputum, , hemoptysis, pleuritic pain, palpitaitons, heartburn, abdominal pain, melena, lower extremity edema, claudication, or rash.   Vital Signs:  Patient profile:   77 year old female Height:      64 inches Weight:      158 pounds BMI:     27.22 Pulse rate:   65 / minute Resp:     14 per minute BP sitting:   138 / 68  (left arm)  Vitals Entered By: Kem Parkinson (November 01, 2009 10:12 AM)  Physical Exam  General:  Affect appropriate Healthy:  appears stated age HEENT: normal Neck supple with no adenopathy JVP normal no bruits no thyromegaly Lungs clear with no wheezing and good diaphragmatic motion Heart:  S1/S2 no murmur,rub, gallop or click PMI normal Abdomen: benighn, BS positve, no tenderness, no AAA no bruit.  No HSM or HJR Distal pulses intact with no bruits No edema Neuro non-focal Skin warm and dry    Impression & Recommendations:  Problem # 1:  DYSPNEA (ICD-786.05) No overt CHF check BNP and CXR Her updated medication list for this problem includes:    Ramipril 10 Mg Caps (Ramipril) .Marland Kitchen... 1 tab qam    Atenolol 50 Mg Tabs (Atenolol) ..... One tablet  daily    Aspirin 81 Mg Tabs (Aspirin) .Marland Kitchen... Daily  Orders: TLB-BMP (Basic Metabolic Panel-BMET) (80048-METABOL) TLB-BNP (B-Natriuretic Peptide) (83880-BNPR) T-2 View CXR (71020TC)  Problem # 2:  CHEST PAIN, ATYPICAL (ICD-786.59) CAD with occluded SVG.  Continue medical Rx Her updated medication list for this problem includes:    Isosorbide Mononitrate Cr 30 Mg Tb24 (Isosorbide mononitrate) .Marland Kitchen... 1 tab by mouth two times a day    Plavix 75 Mg Tabs (Clopidogrel bisulfate) .Marland Kitchen... 1 by mouth once daily    Ramipril 10 Mg Caps (Ramipril) .Marland Kitchen... 1 tab qam    Atenolol 50 Mg Tabs (Atenolol) ..... One tablet daily    Aspirin 81 Mg Tabs (Aspirin) .Marland Kitchen... Daily    Nitrostat 0.4 Mg Subl (Nitroglycerin) .Marland Kitchen... As needed  Problem # 3:  HYPERTENSION (ICD-401.9) Well controlled Her updated medication list for this problem includes:  Ramipril 10 Mg Caps (Ramipril) .Marland Kitchen... 1 tab qam    Atenolol 50 Mg Tabs (Atenolol) ..... One tablet daily    Aspirin 81 Mg Tabs (Aspirin) .Marland Kitchen... Daily  Patient Instructions: 1)  Your physician recommends that you schedule a follow-up appointment in: 3 MONTHS

## 2010-08-23 NOTE — Miscellaneous (Signed)
Summary: Hospice Care Plan/Physician Update   Hospice Care Plan/Physician Update   Imported By: Roderic Ovens 04/06/2010 11:39:37  _____________________________________________________________________  External Attachment:    Type:   Image     Comment:   External Document

## 2010-08-23 NOTE — Miscellaneous (Signed)
Summary: Care Plan/Physician Update   Care Plan/Physician Update   Imported By: Roderic Ovens 04/11/2010 13:16:54  _____________________________________________________________________  External Attachment:    Type:   Image     Comment:   External Document

## 2010-08-23 NOTE — Miscellaneous (Signed)
Summary: Hospice Care Plan/Physician Update   Hospice Care Plan/Physician Update   Imported By: Roderic Ovens 12/31/2009 12:03:52  _____________________________________________________________________  External Attachment:    Type:   Image     Comment:   External Document

## 2010-08-23 NOTE — Miscellaneous (Signed)
Summary: Hospice Care Plan/Physician Update  Hospice Care Plan/Physician Update   Imported By: Roderic Ovens 11/22/2009 16:30:45  _____________________________________________________________________  External Attachment:    Type:   Image     Comment:   External Document

## 2010-08-23 NOTE — Miscellaneous (Signed)
Summary: Hospice Care Plan/Physician Update   Hospice Care Plan/Physician Update   Imported By: Roderic Ovens 02/17/2010 15:44:51  _____________________________________________________________________  External Attachment:    Type:   Image     Comment:   External Document

## 2010-08-23 NOTE — Miscellaneous (Signed)
Summary: Hospice And Palliative Care Plan/Physician Update  Hospice And Palliative Care Plan/Physician Update   Imported By: Roderic Ovens 09/07/2009 12:48:18  _____________________________________________________________________  External Attachment:    Type:   Image     Comment:   External Document

## 2010-08-23 NOTE — Miscellaneous (Signed)
Summary: Hospice Care Plan/Physician Update   Hospice Care Plan/Physician Update   Imported By: Roderic Ovens 01/20/2010 16:43:16  _____________________________________________________________________  External Attachment:    Type:   Image     Comment:   External Document

## 2010-08-23 NOTE — Miscellaneous (Signed)
Summary: Hospice Certification/Treatment Plan  Hospice Certification/Treatment Plan   Imported By: Roderic Ovens 11/29/2009 13:18:44  _____________________________________________________________________  External Attachment:    Type:   Image     Comment:   External Document

## 2010-08-23 NOTE — Miscellaneous (Signed)
Summary: Care Plan/Physician Update   Care Plan/Physician Update   Imported By: Roderic Ovens 03/16/2010 11:48:06  _____________________________________________________________________  External Attachment:    Type:   Image     Comment:   External Document

## 2010-08-23 NOTE — Progress Notes (Signed)
Summary: Calling with medication question  Phone Note Other Incoming   Caller: [facility]ice [facility] [phone] Summary of Call: Calling about the pt taking (Ranexa) Initial call taken by: Judie Grieve,  November 19, 2009 2:44 PM  Follow-up for Phone Call        Spoke with Hospice RN who states pt has had increased frequency of chest pain recently. Pain relieved with NTG. Also taking oxycodone and using oxygen as needed. Dr. Pasty Spillers saw pt  this past Monday and suggested pt may benefit from Ranexa but would like Dr. [name] input on this.  I told RN that Dr. Eden Emms would be back in office on  Monday and that I would forward this to him to review at that time. She is agreeable with this plan Follow-up by: Dossie Arbour, RN, BSN,  November 19, 2009 3:01 PM  Additional Follow-up for Phone Call Additional follow up Details #1::        dont think she needs Ranexa at this point Additional Follow-up by: Colon Branch, MD, [facility],  Nov 21, 2009 3:38 PM    Additional Follow-up for Phone Call Additional follow up Details #2::    spoke with ann marie from hospice, aware of no med change at present Deliah Goody, RN  Nov 22, 2009 3:12 PM

## 2010-08-23 NOTE — Miscellaneous (Signed)
Summary: [facility]  [facility]   Imported By: Marylou Mccoy 08/02/2009 13:49:22  _____________________________________________________________________  External Attachment:    Type:   Image     Comment:   External Document

## 2010-08-23 NOTE — Miscellaneous (Signed)
Summary: Hospice Care Plan and Physician Update  Hospice Care Plan and Physician Update   Imported By: Kassie Mends 08/10/2009 08:52:03  _____________________________________________________________________  External Attachment:    Type:   Image     Comment:   External Document

## 2010-08-23 NOTE — Assessment & Plan Note (Signed)
Summary: per check out/sf   Visit Type:  rov Primary Provider:  Gershon Crane, MD  CC:  chest pain 12/28 took 3 NTG said really had no relief but then put her O2 and started to feel better.  History of Present Illness: Carla Friedman is seen today for CAD, HTN, and elevated lipids.  She has had CABG with stents to the native LAD and SVG to OM.  She has distal disease and chronic chest pain.  She actually is seen by hospice.  She prefers not to be seen in hospital alot.    She has less chest pain off lisinopril and on Atenolol.  We have not started Ranexa.  Her last cath was in 11/2006 and she had a non-ischemic myovue in 11/2007.  She has morphine and oxygen at home.  She has sig. depression.  She looked very good today  I am not convinced that all of her pains are anginal but she seems satisfied to have oxygen at home.    Current Problems (verified): 1)  Hyperthyroidism  (ICD-242.90) 2)  Chest Pain, Atypical  (ICD-786.59) 3)  Low Back Pain  (ICD-724.2) 4)  Colonic Polyps, Adenomatous, Hx of  (ICD-V12.72) 5)  Diverticulosis of Colon  (ICD-562.10) 6)  Back Pain, Chronic  (ICD-724.5) 7)  Hyperlipidemia  (ICD-272.4) 8)  Diabetes Mellitus, Type II  (ICD-250.00) 9)  Bronchitis, Acute  (ICD-466.0) 10)  Family History Diabetes 1st Degree Relative  (ICD-V18.0) 11)  Depression  (ICD-311) 12)  Peptic Ulcer Disease  (ICD-533.90) 13)  Hypertension  (ICD-401.9) 14)  Peripheral Vascular Disease  (ICD-443.9) 15)  Coronary Artery Disease  (ICD-414.00)  Current Medications (verified): 1)  Isosorbide Mononitrate Cr 30 Mg Tb24 (Isosorbide Mononitrate) .Marland Kitchen.. 1 Tab By Mouth Two Times A Day 2)  Lantus 100 Unit/ml Soln (Insulin Glargine) .Marland Kitchen.. 10 Units At Bedtime 3)  Lipitor 20 Mg Tabs (Atorvastatin Calcium) .Marland Kitchen.. 1 By Mouth Once Daily 4)  Metformin Hcl 1000 Mg Tabs (Metformin Hcl) .Marland Kitchen.. 1 Tab By Mouth Once Daily 5)  Plavix 75 Mg Tabs (Clopidogrel Bisulfate) .Marland Kitchen.. 1 By Mouth Once Daily 6)  Tricor 145 Mg Tabs (Fenofibrate)  .Marland Kitchen.. 1 By Mouth Once Daily 7)  Multivitamins   Tabs (Multiple Vitamin) .Marland Kitchen.. 1 By Mouth Once Daily 8)  Calcium 600 Mg  Tabs (Calcium) .... 2 By Mouth Once Daily 9)  Amitriptyline Hcl 50 Mg Tabs (Amitriptyline Hcl) .... Take 1 Tab By Mouth At Bedtime 10)  Bee Pollen 500 Mg  Tabs (Bee Pollen) .Marland Kitchen.. 1 By Mouth Once Daily 11)  Vitamin C Cr 500 Mg Cr-Caps (Ascorbic Acid) .... Once Daily 12)  Ramipril 10 Mg Caps (Ramipril) .Marland Kitchen.. 1 Tab Qam 13)  Atenolol 50 Mg Tabs (Atenolol) .... One Tablet Daily 14)  Promethazine Hcl 25 Mg Tabs (Promethazine Hcl) .... As Needed 15)  Oxycodone Hcl 5 Mg Tabs (Oxycodone Hcl) .... As Needed 16)  Aspirin 81 Mg  Tabs (Aspirin) .... Daily 17)  Nitrostat 0.4 Mg Subl (Nitroglycerin) .... As Needed  Allergies: 1)  ! Hydrochlorothiazide 2)  ! * Oxycodone 3)  ! * Omeprazole 4)  ! Doxycycline 5)  ! * Niaspan 6)  ! Xanax 7)  ! Cephalexin 8)  ! Amoxicillin 9)  ! * Propoxyphene 10)  ! Morphine 11)  ! Codeine 12)  Codeine Phosphate (Codeine Phosphate) 13)  Kadian (Morphine Sulfate)  Past History:  Past Medical History: Last updated: 10/07/2008 Coronary artery disease, sees Dr. Eden Emms Chest pain, atypical Peripheral vascular disease Hypertension Dyslipidemia Diabetes Type II  Peptic ulcer disease Depression Diabetic nueropathy Hypothyroidism Low back pain normal Adenosine Myoview 12-20-07 Squamous cell skin cancer, sees Dr. Isaac Laud at [facility] dermatology sees Julio Sicks at Dr. Donnetta Hail office for GYN exams normal DEXA 2009  Past Surgical History: Last updated: 10/07/2008 Appendectomy Tonsillectomy double bypass heart surgery -stent placement 2001 Cataract extraction colonoscopy 02-19-08 per Dr. Juanda Chance, diverticulosis, no polyps, repeat in 10 yrs CABG 2001  Family History: Last updated: 02/11/2008 Fam hx MI: Father Fam hx Emphysema: Mother Family History of Arthritis Family History Diabetes 1st degree relative: Father, Paternal  Grandfather Family History High cholesterol Family History Hypertension Family History of Cardiovascular disorder Family History Other cancer-breast-Maternal Grandmother  Social History: Last updated: 07/30/2009 Former Smoker Alcohol use-no Widowed Married x2  First husband DO/gay Drug use-no Regular exercise-no Retired lives near [place], who is here today. 4 children one died at age 39   Social History: Former Smoker Alcohol use-no Widowed Married x2  First husband DO/gay Drug use-no Regular exercise-no Retired lives near [name], who is here today. 4 children one died at age 22   Review of Systems       Denies fever, malais, weight loss, blurry vision, decreased visual acuity, cough, sputum, SOB, hemoptysis, pleuritic pain, palpitaitons, heartburn, abdominal pain, melena, lower extremity edema, claudication, or rash. All other systems reviewed and negative  Vital Signs:  Patient profile:   77 year old female Height:      64 inches Weight:      154 pounds Pulse rate:   68 / minute Pulse rhythm:   regular BP sitting:   150 / 66  (left arm) Cuff size:   large  Vitals Entered By: Scherrie Bateman, LPN (July 30, 2009 9:29 AM)  Physical Exam  General:  Affect appropriate Healthy:  appears stated age HEENT: normal Neck supple with no adenopathy JVP normal no bruits no thyromegaly Lungs clear with no wheezing and good diaphragmatic motion Heart:  S1/S2 no murmur,rub, gallop or click PMI normal Abdomen: benighn, BS positve, no tenderness, no AAA no bruit.  No HSM or HJR Distal pulses intact with no bruits No edema Neuro non-focal Skin warm and dry    Impression & Recommendations:  Problem # 1:  CHEST PAIN, ATYPICAL (ICD-786.59) Stable pattern continue antiplatlet and BB  The following medications were removed from the medication list:    Aspir-low 81 Mg Tbec (Aspirin) .Marland Kitchen... 1 by mouth once daily Her updated medication list for this problem includes:     Isosorbide Mononitrate Cr 30 Mg Tb24 (Isosorbide mononitrate) .Marland Kitchen... 1 tab by mouth two times a day    Plavix 75 Mg Tabs (Clopidogrel bisulfate) .Marland Kitchen... 1 by mouth once daily    Ramipril 10 Mg Caps (Ramipril) .Marland Kitchen... 1 tab qam    Atenolol 50 Mg Tabs (Atenolol) ..... One tablet daily    Aspirin 81 Mg Tabs (Aspirin) .Marland Kitchen... Daily    Nitrostat 0.4 Mg Subl (Nitroglycerin) .Marland Kitchen... As needed  Orders: EKG w/ Interpretation (93000)  Problem # 2:  HYPERLIPIDEMIA (ICD-272.4) At goal with no side effects Her updated medication list for this problem includes:    Lipitor 20 Mg Tabs (Atorvastatin calcium) .Marland Kitchen... 1 by mouth once daily    Tricor 145 Mg Tabs (Fenofibrate) .Marland Kitchen... 1 by mouth once daily  CHOL: 129 (08/12/2008)   LDL: 68 (08/12/2008)   HDL: 36.9 (08/12/2008)   TG: 121 (08/12/2008)  Problem # 3:  HYPERTENSION (ICD-401.9) Well controlled continue low sodium diet The following medications were removed from  the medication list:    Aspir-low 81 Mg Tbec (Aspirin) .Marland Kitchen... 1 by mouth once daily Her updated medication list for this problem includes:    Ramipril 10 Mg Caps (Ramipril) .Marland Kitchen... 1 tab qam    Atenolol 50 Mg Tabs (Atenolol) ..... One tablet daily    Aspirin 81 Mg Tabs (Aspirin) .Marland Kitchen... Daily  Patient Instructions: 1)  Your physician recommends that you schedule a follow-up appointment in: 3 MONTHS  WITH DR Eden Emms DUE APRIL 2)  Your physician recommends that you continue on your current medications as directed. Please refer to the Current Medication list given to you today.   EKG Report  Procedure date:  07/30/2009  Findings:      NSR 66 LAD Nonspecific ST/T wave changes Abnormal eCG

## 2010-08-23 NOTE — Miscellaneous (Signed)
Summary: Hospice Certification/Treatment Plan   Hospice Certification/Treatment Plan   Imported By: Roderic Ovens 10/08/2009 13:24:15  _____________________________________________________________________  External Attachment:    Type:   Image     Comment:   External Document

## 2010-08-23 NOTE — Miscellaneous (Signed)
Summary: Hospice and Palliative Care Plan/Physician Update  Hospice and Palliative Care Plan/Physician Update   Imported By: Roderic Ovens 09/16/2009 13:00:15  _____________________________________________________________________  External Attachment:    Type:   Image     Comment:   External Document

## 2010-08-23 NOTE — Miscellaneous (Signed)
Summary: Care Plan /Physician Update  Care Plan /Physician Update   Imported By: Roderic Ovens 10/20/2009 14:28:41  _____________________________________________________________________  External Attachment:    Type:   Image     Comment:   External Document

## 2010-08-23 NOTE — Letter (Signed)
Summary: [facility]  [facility]   Imported By: Marylou Mccoy 02/28/2010 15:36:12  _____________________________________________________________________  External Attachment:    Type:   Image     Comment:   External Document

## 2010-08-23 NOTE — Miscellaneous (Signed)
Summary: Hospice Certification/Treatment Plan  Hospice Certification/Treatment Plan   Imported By: Roderic Ovens 08/10/2009 12:06:55  _____________________________________________________________________  External Attachment:    Type:   Image     Comment:   External Document

## 2010-08-23 NOTE — Miscellaneous (Signed)
Summary: Hospice Care Plan/Physician Update   Hospice Care Plan/Physician Update   Imported By: Roderic Ovens 06/07/2010 12:02:39  _____________________________________________________________________  External Attachment:    Type:   Image     Comment:   External Document

## 2010-08-23 NOTE — Miscellaneous (Signed)
Summary: Care Plan/Physician Update   Care Plan/Physician Update   Imported By: Roderic Ovens 04/21/2010 12:06:15  _____________________________________________________________________  External Attachment:    Type:   Image     Comment:   External Document

## 2010-08-23 NOTE — Miscellaneous (Signed)
Summary: Hospice and Palliative Care Plan/Physician Update  Hospice and Palliative Care Plan/Physician Update   Imported By: Roderic Ovens 08/25/2009 15:05:02  _____________________________________________________________________  External Attachment:    Type:   Image     Comment:   External Document

## 2010-08-23 NOTE — Assessment & Plan Note (Signed)
Summary: F3M/DM   Primary Provider:  Gershon Crane, MD  CC:  check up.  History of Present Illness: Carla Friedman is seen today for CAD, HTN, and elevated lipids.  She has had CABG with stents to the native LAD and SVG to OM.  She has distal disease and chronic chest pain.  She actually is seen by hospice.  She prefers not to be seen in hospital alot.    She has less chest pain off lisinopril and on Atenolol.  We have not started Ranexa.  Her last cath was in 11/2006 and she had a non-ischemic myovue in 11/2007.  She has morphine and oxygen at home.  She has sig. depression.  She looked very good today  I am not convinced that all of her pains are anginal but she seems satisfied to have oxygen at home.    She also complains of SOB.  it does not sound like volume there is no PND, orthopnea, or edema. She continues to be depressed.  She uses oxygen and nitro as a crutch but this has helped her over the years.  She has significant venous insuf with marked dependant discoloration but no arterial insuf.  Current Problems (verified): 1)  Dyspnea  (ICD-786.05) 2)  Hyperthyroidism  (ICD-242.90) 3)  Chest Pain, Atypical  (ICD-786.59) 4)  Low Back Pain  (ICD-724.2) 5)  Colonic Polyps, Adenomatous, Hx of  (ICD-V12.72) 6)  Diverticulosis of Colon  (ICD-562.10) 7)  Back Pain, Chronic  (ICD-724.5) 8)  Hyperlipidemia  (ICD-272.4) 9)  Diabetes Mellitus, Type II  (ICD-250.00) 10)  Bronchitis, Acute  (ICD-466.0) 11)  Family History Diabetes 1st Degree Relative  (ICD-V18.0) 12)  Depression  (ICD-311) 13)  Peptic Ulcer Disease  (ICD-533.90) 14)  Hypertension  (ICD-401.9) 15)  Peripheral Vascular Disease  (ICD-443.9) 16)  Coronary Artery Disease  (ICD-414.00)  Current Medications (verified): 1)  Isosorbide Mononitrate Cr 30 Mg Tb24 (Isosorbide Mononitrate) .Marland Kitchen.. 1 Tab By Mouth Two Times A Day 2)  Lantus 100 Unit/ml Soln (Insulin Glargine) .Marland Kitchen.. 10 Units At Bedtime 3)  Lipitor 20 Mg Tabs (Atorvastatin Calcium) .Marland Kitchen..  1 By Mouth Once Daily 4)  Plavix 75 Mg Tabs (Clopidogrel Bisulfate) .Marland Kitchen.. 1 By Mouth Once Daily 5)  Tricor 145 Mg Tabs (Fenofibrate) .Marland Kitchen.. 1 By Mouth Once Daily 6)  Multivitamins   Tabs (Multiple Vitamin) .Marland Kitchen.. 1 By Mouth Once Daily 7)  Calcium 600 Mg  Tabs (Calcium) .... 2 By Mouth Once Daily 8)  Amitriptyline Hcl 50 Mg Tabs (Amitriptyline Hcl) .... Take 1 Tab By Mouth At Bedtime 9)  Bee Pollen 500 Mg  Tabs (Bee Pollen) .Marland Kitchen.. 1 By Mouth Once Daily 10)  Vitamin C Cr 500 Mg Cr-Caps (Ascorbic Acid) .... Once Daily 11)  Atenolol 50 Mg Tabs (Atenolol) .... One Tablet Daily 12)  Promethazine Hcl 25 Mg Tabs (Promethazine Hcl) .... As Needed 13)  Oxycodone Hcl 5 Mg Tabs (Oxycodone Hcl) .... As Needed 14)  Aspirin 81 Mg  Tabs (Aspirin) .... Daily 15)  Nitrostat 0.4 Mg Subl (Nitroglycerin) .... As Needed 16)  Vitamin D 1000 Unit Tabs (Cholecalciferol) .Marland Kitchen.. 1 Tab By Mouth Once Daily  Allergies (verified): 1)  ! Hydrochlorothiazide 2)  ! * Oxycodone 3)  ! * Omeprazole 4)  ! Doxycycline 5)  ! * Niaspan 6)  ! Xanax 7)  ! Cephalexin 8)  ! Amoxicillin 9)  ! * Propoxyphene 10)  ! Morphine 11)  ! Codeine 12)  Codeine Phosphate (Codeine Phosphate) 13)  Kadian (Morphine Sulfate)  Past  History:  Past Medical History: Last updated: 10/07/2008 Coronary artery disease, sees Dr. Eden Emms Chest pain, atypical Peripheral vascular disease Hypertension Dyslipidemia Diabetes Type II Peptic ulcer disease Depression Diabetic nueropathy Hypothyroidism Low back pain normal Adenosine Myoview 12-20-07 Squamous cell skin cancer, sees Dr. Isaac Laud at [facility]ology sees Julio Sicks at Dr. Donnetta Hail office for GYN exams normal DEXA 2009  Past Surgical History: Last updated: 10/07/2008 Appendectomy Tonsillectomy double bypass heart surgery -stent placement 2001 Cataract extraction colonoscopy 02-19-08 per Dr. Juanda Chance, diverticulosis, no polyps, repeat in 10 yrs CABG 2001  Family History: Last  updated: 02/11/2008 Fam hx MI: Father Fam hx Emphysema: Mother Family History of Arthritis Family History Diabetes 1st degree relative: Father, Paternal Grandfather Family History High cholesterol Family History Hypertension Family History of Cardiovascular disorder Family History Other cancer-breast-Maternal Grandmother  Social History: Last updated: 07/30/2009 Former Smoker Alcohol use-no Widowed Married x2  First husband DO/gay Drug use-no Regular exercise-no Retired lives near [place], who is here today. 4 children one died at age 81   Review of Systems       Denies fever, malais, weight loss, blurry vision, decreased visual acuity, cough, sputum, SOB, hemoptysis, pleuritic pain, palpitaitons, heartburn, abdominal pain, melena, lower extremity edema, claudication, or rash.   Vital Signs:  Patient profile:   77 year old female Height:      64 inches Weight:      155 pounds BMI:     26.70 Pulse rate:   78 / minute Resp:     14 per minute BP sitting:   142 / 76  (left arm)  Vitals Entered By: Kem Parkinson (February 08, 2010 11:13 AM)  Physical Exam  General:  Affect appropriate Healthy:  appears stated age HEENT: normal Neck supple with no adenopathy JVP normal no bruits no thyromegaly Lungs clear with no wheezing and good diaphragmatic motion Heart:  S1/S2 no murmur,rub, gallop or click PMI normal Abdomen: benighn, BS positve, no tenderness, no AAA no bruit.  No HSM or HJR Distal pulses intact with no bruits No edema Neuro non-focal Skin warm and dry Phlegmasia with venous rubor and biphasic DP's by doppler   Impression & Recommendations:  Problem # 1:  CHEST PAIN, ATYPICAL (ICD-786.59) CAD with stable angina Decreasing to 1-2/month  Continue current meds The following medications were removed from the medication list:    Ramipril 10 Mg Caps (Ramipril) .Marland Kitchen... 1 tab qam Her updated medication list for this problem includes:    Isosorbide Mononitrate Cr 30  Mg Tb24 (Isosorbide mononitrate) .Marland Kitchen... 1 tab by mouth two times a day    Plavix 75 Mg Tabs (Clopidogrel bisulfate) .Marland Kitchen... 1 by mouth once daily    Atenolol 50 Mg Tabs (Atenolol) ..... One tablet daily    Aspirin 81 Mg Tabs (Aspirin) .Marland Kitchen... Daily    Nitrostat 0.4 Mg Subl (Nitroglycerin) .Marland Kitchen... As needed  Problem # 2:  HYPERLIPIDEMIA (ICD-272.4) At goal with no side effects Her updated medication list for this problem includes:    Lipitor 20 Mg Tabs (Atorvastatin calcium) .Marland Kitchen... 1 by mouth once daily    Tricor 145 Mg Tabs (Fenofibrate) .Marland Kitchen... 1 by mouth once daily  CHOL: 129 (08/12/2008)   LDL: 68 (08/12/2008)   HDL: 36.9 (08/12/2008)   TG: 121 (08/12/2008)  Problem # 3:  HYPERTENSION (ICD-401.9) Well controlled The following medications were removed from the medication list:    Ramipril 10 Mg Caps (Ramipril) .Marland Kitchen... 1 tab qam Her updated medication list for this problem includes:  Atenolol 50 Mg Tabs (Atenolol) ..... One tablet daily    Aspirin 81 Mg Tabs (Aspirin) .Marland Kitchen... Daily  Problem # 4:  PERIPHERAL VASCULAR DISEASE (ICD-443.9) I think this is moslty venous disease.  No claudication and good pulses by doppler in feet.    Patient Instructions: 1)  Your physician recommends that you schedule a follow-up appointment in: 3 MONTHS WITH DR. Eden Emms  05/10/10  11;15 2)  Your physician recommends that you continue on your current medications as directed. Please refer to the Current Medication list given to you today.

## 2010-08-23 NOTE — Miscellaneous (Signed)
Summary: update med  Clinical Lists Changes  Medications: Added new medication of RAMIPRIL 10 MG CAPS (RAMIPRIL) Take one capsule by mouth daily

## 2010-08-23 NOTE — Miscellaneous (Signed)
Summary: Hospice Care Plan/Physician Update   Hospice Care Plan/Physician Update   Imported By: Roderic Ovens 05/23/2010 11:10:16  _____________________________________________________________________  External Attachment:    Type:   Image     Comment:   External Document

## 2010-08-23 NOTE — Miscellaneous (Signed)
Summary: Hospice Care Plan/Physician Update   Hospice Care Plan/Physician Update   Imported By: Roderic Ovens 06/07/2010 12:02:06  _____________________________________________________________________  External Attachment:    Type:   Image     Comment:   External Document

## 2010-08-23 NOTE — Assessment & Plan Note (Signed)
Summary: F3M/DFG   Primary Provider:  Gershon Crane, MD  CC:  sob...pt states she has stopped her tricor and lipitor for about 2 weeks..pt also complains of chest pains..pt took nitro which helped.  History of Present Illness: Carla Friedman is seen today for CAD, HTN, and elevated lipids.  She has had CABG with stents to the native LAD and SVG to OM.  She has distal disease and chronic chest pain.  She actually is seen by hospice.  She prefers not to be seen in hospital alot.    She has less chest pain off lisinopril and on Atenolol.  We have not started Ranexa.  Her last cath was in 11/2006 and she had a non-ischemic myovue in 11/2007.  She has morphine and oxygen at home.  She has sig. depression.  She looked very good today  I am not convinced that all of her pains are anginal but she seems satisfied to have oxygen at home.    She also complains of SOB.  it does not sound like volume there is no PND, orthopnea, or edema. She continues to be depressed.  She uses oxygen and nitro as a crutch but this has helped her over the years.  She has significant venous insuf with marked dependant discoloration but no arterial insuf.  I will give her Ranexa to take for any bad episodes of pain as she does not want to go to the hospital for anything  Current Problems (verified): 1)  Dyspnea  (ICD-786.05) 2)  Hyperthyroidism  (ICD-242.90) 3)  Chest Pain, Atypical  (ICD-786.59) 4)  Low Back Pain  (ICD-724.2) 5)  Colonic Polyps, Adenomatous, Hx of  (ICD-V12.72) 6)  Diverticulosis of Colon  (ICD-562.10) 7)  Back Pain, Chronic  (ICD-724.5) 8)  Hyperlipidemia  (ICD-272.4) 9)  Diabetes Mellitus, Type II  (ICD-250.00) 10)  Bronchitis, Acute  (ICD-466.0) 11)  Family History Diabetes 1st Degree Relative  (ICD-V18.0) 12)  Depression  (ICD-311) 13)  Peptic Ulcer Disease  (ICD-533.90) 14)  Hypertension  (ICD-401.9) 15)  Peripheral Vascular Disease  (ICD-443.9) 16)  Coronary Artery Disease  (ICD-414.00)  Current  Medications (verified): 1)  Isosorbide Mononitrate Cr 30 Mg Tb24 (Isosorbide Mononitrate) .Marland Kitchen.. 1 Tab By Mouth Two Times A Day 2)  Lantus 100 Unit/ml Soln (Insulin Glargine) .Marland Kitchen.. 10 Units At Bedtime 3)  Plavix 75 Mg Tabs (Clopidogrel Bisulfate) .Marland Kitchen.. 1 By Mouth Once Daily 4)  Multivitamins   Tabs (Multiple Vitamin) .Marland Kitchen.. 1 By Mouth Once Daily 5)  Calcium 600 Mg  Tabs (Calcium) .Marland Kitchen.. 1 By Mouth Once Daily 6)  Amitriptyline Hcl 50 Mg Tabs (Amitriptyline Hcl) .... Take 1 Tab By Mouth At Bedtime 7)  Bee Pollen 500 Mg  Tabs (Bee Pollen) .Marland Kitchen.. 1 By Mouth Once Daily 8)  Vitamin C Cr 500 Mg Cr-Caps (Ascorbic Acid) .... Once Daily 9)  Atenolol 50 Mg Tabs (Atenolol) .... One Tablet Daily 10)  Oxycodone Hcl 5 Mg Tabs (Oxycodone Hcl) .... As Needed 11)  Aspirin 81 Mg  Tabs (Aspirin) .... Daily 12)  Nitrostat 0.4 Mg Subl (Nitroglycerin) .... As Needed 13)  Vitamin D 1000 Unit Tabs (Cholecalciferol) .Marland Kitchen.. 1 Tab By Mouth Once Daily 14)  Ramipril 10 Mg Caps (Ramipril) .... Take One Capsule By Mouth Daily 15)  Ranexa 500 Mg Xr12h-Tab (Ranolazine) .Marland Kitchen.. 1 Two Times A Day  Allergies (verified): 1)  ! Hydrochlorothiazide 2)  ! * Oxycodone 3)  ! * Omeprazole 4)  ! Doxycycline 5)  ! * Niaspan 6)  ! Xanax  7)  ! Cephalexin 8)  ! Amoxicillin 9)  ! * Propoxyphene 10)  ! Morphine 11)  ! Codeine 12)  Codeine Phosphate (Codeine Phosphate) 13)  Kadian (Morphine Sulfate)  Past History:  Past Medical History: Last updated: 10/07/2008 Coronary artery disease, sees Dr. Eden Emms Chest pain, atypical Peripheral vascular disease Hypertension Dyslipidemia Diabetes Type II Peptic ulcer disease Depression Diabetic nueropathy Hypothyroidism Low back pain normal Adenosine Myoview 12-20-07 Squamous cell skin cancer, sees Dr. Isaac Laud at [facility] dermatology sees Julio Sicks at Dr. Donnetta Hail office for GYN exams normal DEXA 2009  Past Surgical History: Last updated:  10/07/2008 Appendectomy Tonsillectomy double bypass heart surgery -stent placement 2001 Cataract extraction colonoscopy 02-19-08 per Dr. Juanda Chance, diverticulosis, no polyps, repeat in 10 yrs CABG 2001  Family History: Last updated: 02/11/2008 Fam hx MI: Father Fam hx Emphysema: Mother Family History of Arthritis Family History Diabetes 1st degree relative: Father, Paternal Grandfather Family History High cholesterol Family History Hypertension Family History of Cardiovascular disorder Family History Other cancer-breast-Maternal Grandmother  Social History: Last updated: 07/30/2009 Former Smoker Alcohol use-no Widowed Married x2  First husband DO/gay Drug use-no Regular exercise-no Retired lives near [place], who is here today. 4 children one died at age 59   Review of Systems       Denies fever, malais, weight loss, blurry vision, decreased visual acuity, cough, sputum, SOB, hemoptysis, pleuritic pain, palpitaitons, heartburn, abdominal pain, melena, lower extremity edema, claudication, or rash.   Vital Signs:  Patient profile:   77 year old female Height:      64 inches Weight:      157 pounds BMI:     27.05 Resp:     14 per minute BP sitting:   158 / 80  (left arm)  Vitals Entered By: Kem Parkinson (May 10, 2010 11:26 AM)  Physical Exam  General:  Affect appropriate Healthy:  appears stated age HEENT: normal Neck supple with no adenopathy JVP normal no bruits no thyromegaly Lungs clear with no wheezing and good diaphragmatic motion Heart:  S1/S2 no murmur,rub, gallop or click PMI normal Abdomen: benighn, BS positve, no tenderness, no AAA no bruit.  No HSM or HJR Distal pulses intact with no bruits Plus one brawny bilateral  edema Neuro non-focal Skin warm and dry    Impression & Recommendations:  Problem # 1:  CHEST PAIN, ATYPICAL (ICD-786.59) CAD medical Rx.  Add ranexa as needed Her updated medication list for this problem includes:     Isosorbide Mononitrate Cr 30 Mg Tb24 (Isosorbide mononitrate) .Marland Kitchen... 1 tab by mouth two times a day    Plavix 75 Mg Tabs (Clopidogrel bisulfate) .Marland Kitchen... 1 by mouth once daily    Atenolol 50 Mg Tabs (Atenolol) ..... One tablet daily    Aspirin 81 Mg Tabs (Aspirin) .Marland Kitchen... Daily    Nitrostat 0.4 Mg Subl (Nitroglycerin) .Marland Kitchen... As needed    Ramipril 10 Mg Caps (Ramipril) .Marland Kitchen... Take one capsule by mouth daily    Ranexa 500 Mg Xr12h-tab (Ranolazine) .Marland Kitchen... 1 two times a day  Orders: EKG w/ Interpretation (93000)  Problem # 2:  HYPERLIPIDEMIA (ICD-272.4) Continue 2 drug RX The following medications were removed from the medication list:    Lipitor 20 Mg Tabs (Atorvastatin calcium) .Marland Kitchen... 1 by mouth once daily    Tricor 145 Mg Tabs (Fenofibrate) .Marland Kitchen... 1 by mouth once daily  CHOL: 129 (08/12/2008)   LDL: 68 (08/12/2008)   HDL: 36.9 (08/12/2008)   TG: 121 (08/12/2008)  Problem # 3:  HYPERTENSION (  ICD-401.9) Well controlled Her updated medication list for this problem includes:    Atenolol 50 Mg Tabs (Atenolol) ..... One tablet daily    Aspirin 81 Mg Tabs (Aspirin) .Marland Kitchen... Daily    Ramipril 10 Mg Caps (Ramipril) .Marland Kitchen... Take one capsule by mouth daily  Problem # 4:  PERIPHERAL VASCULAR DISEASE (ICD-443.9) Stabel primarily venous with insuficiency Continue diuretic and support hose  Patient Instructions: 1)  Your physician recommends that you schedule a follow-up appointment in: 3 MONTHS WITH DR Eden Emms 2)  Your physician has recommended you make the following change in your medication: RANEXA Prescriptions: RANEXA 500 MG XR12H-TAB (RANOLAZINE) 1 two times a day  #60 x 3   Entered by:   Scherrie Bateman, LPN   Authorized by:   Colon Branch, MD, [facility]   Signed by:   Scherrie Bateman, LPN on 04/54/0981   Method used:   Electronically to        [id number]* (retail)       [address]       [place], [place]  [address]       Ph: [phone]       Fax: [phone]   RxID:    [id number]    EKG Report  Procedure date:  05/10/2010  Findings:      NSR LAD LVH Nonspecifci ST changes Rate 76

## 2010-08-23 NOTE — Miscellaneous (Signed)
Summary: Hospice Care Plan/Physician Update    Hospice Care Plan/Physician Update   Imported By: Roderic Ovens 06/07/2010 12:01:34  _____________________________________________________________________  External Attachment:    Type:   Image     Comment:   External Document

## 2010-08-23 NOTE — Miscellaneous (Signed)
Summary: Hospice Certification/Treatment Plan   Hospice Certification/Treatment Plan   Imported By: Roderic Ovens 02/11/2010 11:11:47  _____________________________________________________________________  External Attachment:    Type:   Image     Comment:   External Document

## 2010-08-23 NOTE — Miscellaneous (Signed)
Summary: [facility]  [facility]   Imported By: Erle Crocker 01/04/2010 07:30:58  _____________________________________________________________________  External Attachment:    Type:   Image     Comment:   External Document

## 2010-08-23 NOTE — Miscellaneous (Signed)
Summary: Hospice Care Plan/Physician Update   Hospice Care Plan/Physician Update   Imported By: Roderic Ovens 03/02/2010 12:37:46  _____________________________________________________________________  External Attachment:    Type:   Image     Comment:   External Document

## 2010-08-23 NOTE — Miscellaneous (Signed)
Summary: [facility]   Imported By: Kassie Mends 08/18/2009 10:52:10  _____________________________________________________________________  External Attachment:    Type:   Image     Comment:   External Document

## 2010-08-23 NOTE — Letter (Signed)
[facility]: [facility]   Imported By: Marylou Mccoy 03/29/2010 12:58:57  _____________________________________________________________________  External Attachment:    Type:   Image     Comment:   External Document

## 2010-08-23 NOTE — Letter (Signed)
Summary: [facility]   Imported By: Marylou Mccoy 04/26/2010 11:32:27  _____________________________________________________________________  External Attachment:    Type:   Image     Comment:   External Document

## 2010-08-23 NOTE — Miscellaneous (Signed)
Summary: Physician's Interim Order Report  Physician's Interim Order Report   Imported By: Marylou Mccoy 06/20/2010 11:17:25  _____________________________________________________________________  External Attachment:    Type:   Image     Comment:   External Document

## 2010-08-23 NOTE — Miscellaneous (Signed)
Summary: Care Plan Update  Care Plan Update   Imported By: Marylou Mccoy 05/30/2010 09:51:54  _____________________________________________________________________  External Attachment:    Type:   Image     Comment:   External Document

## 2010-08-23 NOTE — Miscellaneous (Signed)
Summary: Hospice Certification/Treatment Plan   Hospice Certification/Treatment Plan   Imported By: Roderic Ovens 03/02/2010 12:38:29  _____________________________________________________________________  External Attachment:    Type:   Image     Comment:   External Document

## 2010-08-25 ENCOUNTER — Telehealth: Payer: Self-pay | Admitting: Cardiovascular Disease

## 2010-08-25 NOTE — Miscellaneous (Signed)
Summary: [facility] Physician Consultation   [facility]   Imported By: Roderic Ovens 08/09/2010 14:10:39  _____________________________________________________________________  External Attachment:    Type:   Image     Comment:   External Document

## 2010-08-25 NOTE — Assessment & Plan Note (Signed)
Summary: [email or web address]   CC:  sob.  Current Medications (verified): 1)  Isosorbide Mononitrate Cr 30 Mg Tb24 (Isosorbide Mononitrate) .Marland Kitchen.. 1 Tab By Mouth Two Times A Day 2)  Lantus 100 Unit/ml Soln (Insulin Glargine) .Marland Kitchen.. 10 Units At Bedtime 3)  Plavix 75 Mg Tabs (Clopidogrel Bisulfate) .Marland Kitchen.. 1 By Mouth Once Daily 4)  Multivitamins   Tabs (Multiple Vitamin) .Marland Kitchen.. 1 By Mouth Once Daily 5)  Calcium 600 Mg  Tabs (Calcium) .Marland Kitchen.. 1 By Mouth Once Daily 6)  Amitriptyline Hcl 50 Mg Tabs (Amitriptyline Hcl) .... Take 1 Tab By Mouth At Bedtime 7)  Bee Pollen 500 Mg  Tabs (Bee Pollen) .Marland Kitchen.. 1 By Mouth Once Daily 8)  Vitamin C Cr 500 Mg Cr-Caps (Ascorbic Acid) .... Once Daily 9)  Atenolol 50 Mg Tabs (Atenolol) .... One Tablet Daily 10)  Oxycodone Hcl 5 Mg Tabs (Oxycodone Hcl) .... As Needed 11)  Aspirin 81 Mg  Tabs (Aspirin) .... Daily 12)  Nitrostat 0.4 Mg Subl (Nitroglycerin) .... As Needed 13)  Vitamin D 1000 Unit Tabs (Cholecalciferol) .Marland Kitchen.. 1 Tab By Mouth Once Daily 14)  Ramipril 10 Mg Caps (Ramipril) .... Take One Capsule By Mouth Daily 15)  Ranexa 500 Mg Xr12h-Tab (Ranolazine) .Marland Kitchen.. 1 Tab By Mouth Once Daily 16)  Metformin Hcl 1000 Mg Tabs (Metformin Hcl) .... Take 1 Tablet By Mouth Two Times A Day 17)  Ciprofloxacin Hcl 500 Mg Tabs (Ciprofloxacin Hcl) .Marland Kitchen.. 1 By Mouth Two Times A Day For 7 Days  Allergies: 1)  ! Hydrochlorothiazide 2)  ! * Omeprazole 3)  ! Doxycycline 4)  ! * Niaspan 5)  ! Xanax 6)  ! Cephalexin 7)  ! Amoxicillin 8)  ! * Propoxyphene 9)  ! Morphine 10)  ! Codeine 11)  Codeine Phosphate (Codeine Phosphate) 12)  Kadian (Morphine Sulfate)  Vital Signs:  Patient profile:   77 year old female Height:      64 inches Weight:      157 pounds BMI:     27.05 Pulse rate:   68 / minute Resp:     14 per minute BP sitting:   139 / 70  (left arm)  Vitals Entered By: Kem Parkinson (August 11, 2010 2:23 PM)  Physical Exam  General:  Affect appropriate Healthy:  appears  stated age HEENT: normal Neck supple with no adenopathy JVP normal no bruits no thyromegaly Lungs clear with no wheezing and good diaphragmatic motion Heart:  S1/S2 no murmur,rub, gallop or click PMI normal Abdomen: benighn, BS positve, no tenderness, no AAA no bruit.  No HSM or HJR Distal pulses intact with no bruits No edema Neuro non-focal Skin warm and dry    Impression & Recommendations:  Problem # 1:  DYSPNEA (ICD-786.05) Improved.  No evidene of volume overload Her updated medication list for this problem includes:    Atenolol 50 Mg Tabs (Atenolol) ..... One tablet daily    Aspirin 81 Mg Tabs (Aspirin) .Marland Kitchen... Daily    Ramipril 10 Mg Caps (Ramipril) .Marland Kitchen... Take one capsule by mouth daily  Problem # 2:  CHEST PAIN, ATYPICAL (ICD-786.59) Improved with Ranexa.  Subsidized by hospice.  Try to avoid repeat cath at patients request Her updated medication list for this problem includes:    Isosorbide Mononitrate Cr 30 Mg Tb24 (Isosorbide mononitrate) .Marland Kitchen... 1 tab by mouth two times a day    Plavix 75 Mg Tabs (Clopidogrel bisulfate) .Marland Kitchen... 1 by mouth once daily    Atenolol 50 Mg Tabs (Atenolol) ..... One  tablet daily    Aspirin 81 Mg Tabs (Aspirin) .Marland Kitchen... Daily    Nitrostat 0.4 Mg Subl (Nitroglycerin) .Marland Kitchen... As needed    Ramipril 10 Mg Caps (Ramipril) .Marland Kitchen... Take one capsule by mouth daily    Ranexa 500 Mg Xr12h-tab (Ranolazine) .Marland Kitchen... 1 tab by mouth once daily  Problem # 3:  HYPERTHYROIDISM (ICD-242.90) Well controlled Her updated medication list for this problem includes:    Atenolol 50 Mg Tabs (Atenolol) ..... One tablet daily  Problem # 4:  HYPERLIPIDEMIA (ICD-272.4) She will resume generic lipitor and stop Tricor CHOL: 208 (08/05/2010)   LDL: 68 (08/12/2008)   HDL: 38.50 (08/05/2010)   TG: 445.0 (08/05/2010)  Patient Instructions: 1)  Your physician wants you to follow-up in: 3 MONTHS  You will receive a reminder letter in the mail two months in advance. If you don't receive  a letter, please call our office to schedule the follow-up appointment.

## 2010-08-25 NOTE — Miscellaneous (Signed)
Summary: Care Plan/Physician Update   Care Plan/Physician Update   Imported By: Roderic Ovens 07/19/2010 16:24:03  _____________________________________________________________________  External Attachment:    Type:   Image     Comment:   External Document

## 2010-08-25 NOTE — Miscellaneous (Signed)
  Clinical Lists Changes  Medications: Added new medication of LIPITOR 40 MG TABS (ATORVASTATIN CALCIUM) Take one tablet by mouth daily. - Signed Rx of LIPITOR 40 MG TABS (ATORVASTATIN CALCIUM) Take one tablet by mouth daily.;  #30 x 12;  Signed;  Entered by: Deliah Goody, RN;  Authorized by: Colon Branch, MD, [facility];  Method used: Print then Give to Patient    Prescriptions: LIPITOR 40 MG TABS (ATORVASTATIN CALCIUM) Take one tablet by mouth daily.  #30 x 12   Entered by:   Deliah Goody, RN   Authorized by:   Colon Branch, MD, [facility]   Signed by:   Deliah Goody, RN on 08/11/2010   Method used:   Print then Give to Patient   RxID:   [id number]

## 2010-08-25 NOTE — Miscellaneous (Signed)
Summary: Care Plan/Physician Update  Care Plan/Physician Update   Imported By: Roderic Ovens 08/09/2010 14:09:03  _____________________________________________________________________  External Attachment:    Type:   Image     Comment:   External Document

## 2010-08-25 NOTE — Miscellaneous (Signed)
Summary: Certification/Treatment Plan   Certification/Treatment Plan   Imported By: Roderic Ovens 08/09/2010 14:06:03  _____________________________________________________________________  External Attachment:    Type:   Image     Comment:   External Document

## 2010-08-25 NOTE — Letter (Signed)
Summary: [facility]  [facility]   Imported By: Marylou Mccoy 07/06/2010 09:33:40  _____________________________________________________________________  External Attachment:    Type:   Image     Comment:   External Document

## 2010-08-25 NOTE — Assessment & Plan Note (Signed)
Summary: fu on dm/njr   Vital Signs:  Patient profile:   77 year old female Weight:      157 pounds O2 Sat:      97 % Temp:     98.2 degrees F Pulse rate:   78 / minute BP sitting:   140 / 80  (left arm) Cuff size:   regular  Vitals Entered By: Pura Spice, RN (August 05, 2010 8:38 AM) CC: on hospice   now for 1 1/2 yrs.  needs refills on all meds .  Sees Dr Eden Emms every 3 months  and Dr Gibson Ramp is Hospice Dr.  Molly Maduro this am at home 107 Is Patient Diabetic? Yes Did you bring your meter with you today? No   History of Present Illness: here with her daughter to follow up on DM and general health issues. She has been on Hospice now for 18 months, but she seems to be holding up extremely well. She sees Dr. Eden Emms every 3 months. She had been struggling with constant chest pains, but these have largely gone away by using Ranexa. She is fairly sedentary now, but is comfortable. Her random glucoses range in the 100s and 200s. She watches her diet, but she stopped taking Lipitor and Tricor 4 months ago because she "got tired of taking them".   Allergies: 1)  ! Hydrochlorothiazide 2)  ! * Omeprazole 3)  ! Doxycycline 4)  ! * Niaspan 5)  ! Xanax 6)  ! Cephalexin 7)  ! Amoxicillin 8)  ! * Propoxyphene 9)  ! Morphine 10)  ! Codeine 11)  Codeine Phosphate (Codeine Phosphate) 12)  Kadian (Morphine Sulfate)  Past History:  Past Medical History: Reviewed history from 10/07/2008 and no changes required. Coronary artery disease, sees Dr. Eden Emms Chest pain, atypical Peripheral vascular disease Hypertension Dyslipidemia Diabetes Type II Peptic ulcer disease Depression Diabetic nueropathy Hypothyroidism Low back pain normal Adenosine Myoview 12-20-07 Squamous cell skin cancer, sees Dr. Isaac Laud at [facility] dermatology sees Julio Sicks at Dr. Donnetta Hail office for GYN exams normal DEXA 2009  Past Surgical History: Reviewed history from 10/07/2008 and no changes  required. Appendectomy Tonsillectomy double bypass heart surgery -stent placement 2001 Cataract extraction colonoscopy 02-19-08 per Dr. Juanda Chance, diverticulosis, no polyps, repeat in 10 yrs CABG 2001  Past History:  Care Management: Cardiology:  Dr [facility] Dr  Dr Gibson Ramp  Review of Systems  The patient denies anorexia, fever, weight loss, weight gain, vision loss, decreased hearing, hoarseness, chest pain, syncope, dyspnea on exertion, peripheral edema, prolonged cough, headaches, hemoptysis, abdominal pain, melena, hematochezia, severe indigestion/heartburn, hematuria, incontinence, genital sores, muscle weakness, suspicious skin lesions, transient blindness, difficulty walking, depression, unusual weight change, abnormal bleeding, enlarged lymph nodes, angioedema, breast masses, and testicular masses.    Physical Exam  General:  Well-developed,well-nourished,in no acute distress; alert,appropriate and cooperative throughout examination Neck:  No deformities, masses, or tenderness noted. Lungs:  Normal respiratory effort, chest expands symmetrically. Lungs are clear to auscultation, no crackles or wheezes. Heart:  Normal rate and regular rhythm. S1 and S2 normal without gallop, murmur, click, rub or other extra sounds. Psych:  Cognition and judgment appear intact. Alert and cooperative with normal attention span and concentration. No apparent delusions, illusions, hallucinations   Impression & Recommendations:  Problem # 1:  CHEST PAIN, ATYPICAL (ICD-786.59)  Problem # 2:  HYPERLIPIDEMIA (ICD-272.4)  Problem # 3:  DIABETES MELLITUS, TYPE II (ICD-250.00)  Her updated medication list for this problem includes:    Lantus  100 Unit/ml Soln (Insulin glargine) .Marland KitchenMarland KitchenMarland KitchenMarland Kitchen 10 units at bedtime    Aspirin 81 Mg Tabs (Aspirin) .Marland Kitchen... Daily    Ramipril 10 Mg Caps (Ramipril) .Marland Kitchen... Take one capsule by mouth daily    Metformin Hcl 1000 Mg Tabs (Metformin hcl) .Marland Kitchen... Take 1 tablet by mouth two  times a day  Orders: UA Dipstick w/o Micro (automated)  (81003) Venipuncture (16109) TLB-Lipid Panel (80061-LIPID) TLB-BMP (Basic Metabolic Panel-BMET) (80048-METABOL) TLB-CBC Platelet - w/Differential (85025-CBCD) TLB-Hepatic/Liver Function Pnl (80076-HEPATIC) TLB-TSH (Thyroid Stimulating Hormone) (84443-TSH) TLB-A1C / Hgb A1C (Glycohemoglobin) (83036-A1C)  Problem # 4:  DEPRESSION (ICD-311)  Her updated medication list for this problem includes:    Amitriptyline Hcl 50 Mg Tabs (Amitriptyline hcl) .Marland Kitchen... Take 1 tab by mouth at bedtime  Problem # 5:  HYPERTENSION (ICD-401.9)  Her updated medication list for this problem includes:    Atenolol 50 Mg Tabs (Atenolol) ..... One tablet daily    Ramipril 10 Mg Caps (Ramipril) .Marland Kitchen... Take one capsule by mouth daily  Problem # 6:  CORONARY ARTERY DISEASE (ICD-414.00)  Her updated medication list for this problem includes:    Isosorbide Mononitrate Cr 30 Mg Tb24 (Isosorbide mononitrate) .Marland Kitchen... 1 tab by mouth two times a day    Plavix 75 Mg Tabs (Clopidogrel bisulfate) .Marland Kitchen... 1 by mouth once daily    Atenolol 50 Mg Tabs (Atenolol) ..... One tablet daily    Aspirin 81 Mg Tabs (Aspirin) .Marland Kitchen... Daily    Nitrostat 0.4 Mg Subl (Nitroglycerin) .Marland Kitchen... As needed    Ramipril 10 Mg Caps (Ramipril) .Marland Kitchen... Take one capsule by mouth daily    Ranexa 500 Mg Xr12h-tab (Ranolazine) .Marland Kitchen... 1 two times a day  Complete Medication List: 1)  Isosorbide Mononitrate Cr 30 Mg Tb24 (Isosorbide mononitrate) .Marland Kitchen.. 1 tab by mouth two times a day 2)  Lantus 100 Unit/ml Soln (Insulin glargine) .Marland Kitchen.. 10 units at bedtime 3)  Plavix 75 Mg Tabs (Clopidogrel bisulfate) .Marland Kitchen.. 1 by mouth once daily 4)  Multivitamins Tabs (Multiple vitamin) .Marland Kitchen.. 1 by mouth once daily 5)  Calcium 600 Mg Tabs (Calcium) .Marland Kitchen.. 1 by mouth once daily 6)  Amitriptyline Hcl 50 Mg Tabs (Amitriptyline hcl) .... Take 1 tab by mouth at bedtime 7)  Bee Pollen 500 Mg Tabs (Bee pollen) .Marland Kitchen.. 1 by mouth once daily 8)   Vitamin C Cr 500 Mg Cr-caps (Ascorbic acid) .... Once daily 9)  Atenolol 50 Mg Tabs (Atenolol) .... One tablet daily 10)  Oxycodone Hcl 5 Mg Tabs (Oxycodone hcl) .... As needed 11)  Aspirin 81 Mg Tabs (Aspirin) .... Daily 12)  Nitrostat 0.4 Mg Subl (Nitroglycerin) .... As needed 13)  Vitamin D 1000 Unit Tabs (Cholecalciferol) .Marland Kitchen.. 1 tab by mouth once daily 14)  Ramipril 10 Mg Caps (Ramipril) .... Take one capsule by mouth daily 15)  Ranexa 500 Mg Xr12h-tab (Ranolazine) .Marland Kitchen.. 1 two times a day 16)  Metformin Hcl 1000 Mg Tabs (Metformin hcl) .... Take 1 tablet by mouth two times a day 17)  Fluconazole 150 Mg Tabs (Fluconazole)  Patient Instructions: 1)  get labs  Prescriptions: METFORMIN HCL 1000 MG TABS (METFORMIN HCL) take 1 tablet by mouth two times a day  #60 x 11   Entered and Authorized by:   Nelwyn Salisbury MD   Signed by:   Nelwyn Salisbury MD on 08/05/2010   Method used:   Electronically to        [id number] (retail)       [phone]  [place], [place]  [address]       Ph: [phone]       Fax: [phone]   RxID:   [id number] LANTUS 100 UNIT/ML SOLN (INSULIN GLARGINE) 10 units at bedtime  #300 x 11   Entered and Authorized by:   Nelwyn Salisbury MD   Signed by:   Nelwyn Salisbury MD on 08/05/2010   Method used:   Electronically to        [facility] [id number]* (retail)       [address]       [place], [place]  [address]       Ph: [phone]       Fax: [phone]   RxID:   [id number]    Orders Added: 1)  Est. Patient Level IV [22025] 2)  UA Dipstick w/o Micro (automated)  [81003] 3)  Venipuncture [42706] 4)  TLB-Lipid Panel [80061-LIPID] 5)  TLB-BMP (Basic Metabolic Panel-BMET) [80048-METABOL] 6)  TLB-CBC Platelet - w/Differential [85025-CBCD] 7)  TLB-Hepatic/Liver Function Pnl [80076-HEPATIC] 8)  TLB-TSH (Thyroid Stimulating Hormone) [84443-TSH] 9)  TLB-A1C / Hgb A1C (Glycohemoglobin) [83036-A1C]  Appended  Document: Orders Update    Clinical Lists Changes  Orders: Added new Service order of Specimen Handling (23762) - Signed      Appended Document: fu on dm/njr  Laboratory Results   Urine Tests    Routine Urinalysis   Color: yellow Appearance: Clear Glucose: negative   (Normal Range: Negative) Bilirubin: negative   (Normal Range: Negative) Ketone: negative   (Normal Range: Negative) Spec. Gravity: 1.010   (Normal Range: 1.003-1.035) Blood: trace-lysed   (Normal Range: Negative) pH: 5.0   (Normal Range: 5.0-8.0) Protein: 1+   (Normal Range: Negative) Urobilinogen: 0.2   (Normal Range: 0-1) Nitrite: negative   (Normal Range: Negative) Leukocyte Esterace: 3+   (Normal Range: Negative)    Comments: Rita Ohara  August 05, 2010 10:30 AM      Appended Document: fu on dm/njr she has a UTI. Call in Cipro 500 mg two times a day for 7 days   Appended Document: fu on dm/njr pt aware states hospice dr has on flucanozle 150 mg and has been on for 7 days.  still call in cipro?  [facility] [place]   Appended Document: fu on dm/njr cipro called

## 2010-08-31 NOTE — Progress Notes (Signed)
Summary: pt needs another Rx approved  Phone Note Call from Patient Call back at Home Phone [phone]   Caller: Patient Reason for Call: Talk to Nurse, Talk to Doctor Summary of Call: atorvastatin 40mg  and she needs a Rx for pravastatin because its cheaper she uses [facility] Initial call taken by: Omer Jack,  August 25, 2010 3:29 PM  Follow-up for Phone Call        spoke with pt, she wants pravachol because it is cheaper. script fowarded to pharm Deliah Goody, RN  August 25, 2010 5:59 PM     New/Updated Medications: PRAVASTATIN SODIUM 40 MG TABS (PRAVASTATIN SODIUM) Take one tablet by mouth daily at bedtime Prescriptions: PRAVASTATIN SODIUM 40 MG TABS (PRAVASTATIN SODIUM) Take one tablet by mouth daily at bedtime  #30 x 12   Entered by:   Deliah Goody, RN   Authorized by:   Colon Branch, MD, [facility]   Signed by:   Deliah Goody, RN on 08/25/2010   Method used:   Electronically to        [facility] [address]* (retail)       [address]       [place], [place]  [address]       Ph: [phone]       Fax: [phone]   RxID:   [id number]

## 2010-08-31 NOTE — Progress Notes (Signed)
Summary: pt wants generic  Phone Note Refill Request Call back at [phone] Message from:  Pharmacy  Refills Requested: Medication #1:  LIPITOR 40 MG TABS Take one tablet by mouth daily.Thayer Ohm from [facility] called because pt wants generic brand of medication mentioned above  Initial call taken by: Omer Jack,  August 23, 2010 3:50 PM    New/Updated Medications: ATORVASTATIN CALCIUM 40 MG TABS (ATORVASTATIN CALCIUM) 1 tab by mouth qd Prescriptions: ATORVASTATIN CALCIUM 40 MG TABS (ATORVASTATIN CALCIUM) 1 tab by mouth qd  #30 x 12   Entered by:   Kem Parkinson   Authorized by:   Colon Branch, MD, [facility]   Signed by:   Kem Parkinson on 08/23/2010   Method used:   Faxed to ...       [facility] (retail)       [phone]       [place], [place]  [address]       Ph: [phone]       Fax: [phone]   RxID:   [id number]  spoke with pharmacy said it ok to give pt generic

## 2010-09-03 ENCOUNTER — Encounter: Payer: Self-pay | Admitting: Cardiovascular Disease

## 2010-09-08 ENCOUNTER — Encounter: Payer: Self-pay | Admitting: Cardiovascular Disease

## 2010-09-09 ENCOUNTER — Encounter: Payer: Self-pay | Admitting: Cardiovascular Disease

## 2010-09-14 NOTE — Letter (Addendum)
Summary: [facility]: Physician Update  [facility]: Physician Update   Imported By: Earl Many 09/08/2010 15:40:02  _____________________________________________________________________  External Attachments:     1. Type:   Image          Comment:   External Document    2. Type:   Image          Comment:   External Document

## 2010-09-14 NOTE — Miscellaneous (Signed)
Summary: Care Plan/Physician Update   Care Plan/Physician Update   Imported By: Roderic Ovens 09/05/2010 13:17:39  _____________________________________________________________________  External Attachment:    Type:   Image     Comment:   External Document

## 2010-09-15 ENCOUNTER — Encounter: Payer: Self-pay | Admitting: Cardiovascular Disease

## 2010-09-19 ENCOUNTER — Encounter: Payer: Self-pay | Admitting: Cardiovascular Disease

## 2010-09-29 NOTE — Letter (Signed)
Summary: Care Plan Update  Care Plan Update   Imported By: Marylou Mccoy 09/23/2010 11:37:52  _____________________________________________________________________  External Attachment:    Type:   Image     Comment:   External Document

## 2010-10-04 NOTE — Miscellaneous (Signed)
Summary: Hospice Plan of Treatment  Hospice Plan of Treatment   Imported By: Kassie Mends 09/27/2010 10:46:08  _____________________________________________________________________  External Attachment:    Type:   Image     Comment:   External Document

## 2010-10-04 NOTE — Miscellaneous (Signed)
Summary: Physician Interim Order Report   Physician Interim Order Report   Imported By: Roderic Ovens 09/29/2010 09:35:30  _____________________________________________________________________  External Attachment:    Type:   Image     Comment:   External Document

## 2010-10-04 NOTE — Miscellaneous (Signed)
Summary: Physician Interim Order Report   Physician Interim Order Report   Imported By: Roderic Ovens 09/29/2010 09:31:08  _____________________________________________________________________  External Attachment:    Type:   Image     Comment:   External Document

## 2010-10-04 NOTE — Miscellaneous (Signed)
Summary: [facility] Physician Consult  [facility] Physician Consult   Imported By: Roderic Ovens 09/29/2010 09:40:06  _____________________________________________________________________  External Attachment:    Type:   Image     Comment:   External Document

## 2010-10-11 NOTE — Miscellaneous (Signed)
Summary: Care Plan Update  Care Plan Update   Imported By: Marylou Mccoy 10/04/2010 16:20:43  _____________________________________________________________________  External Attachment:    Type:   Image     Comment:   External Document

## 2010-10-12 ENCOUNTER — Other Ambulatory Visit: Payer: Self-pay | Admitting: *Deleted

## 2010-10-12 DIAGNOSIS — R0789 Other chest pain: Secondary | ICD-10-CM

## 2010-10-12 MED ORDER — RAMIPRIL 10 MG PO CAPS: 10.0000 mg | ORAL_CAPSULE | Freq: Every day | ORAL | Status: DC

## 2010-10-14 ENCOUNTER — Encounter: Payer: Self-pay | Admitting: Cardiovascular Disease

## 2010-10-25 ENCOUNTER — Ambulatory Visit (INDEPENDENT_AMBULATORY_CARE_PROVIDER_SITE_OTHER): Payer: Medicare Other | Admitting: Cardiovascular Disease

## 2010-10-25 ENCOUNTER — Encounter: Payer: Self-pay | Admitting: Cardiovascular Disease

## 2010-10-25 VITALS — BP 130/70 | HR 70 | Resp 14 | Wt 160.0 lb

## 2010-10-25 DIAGNOSIS — I2510 Atherosclerotic heart disease of native coronary artery without angina pectoris: Secondary | ICD-10-CM

## 2010-10-25 DIAGNOSIS — E785 Hyperlipidemia, unspecified: Secondary | ICD-10-CM

## 2010-10-25 DIAGNOSIS — I10 Essential (primary) hypertension: Secondary | ICD-10-CM

## 2010-10-25 DIAGNOSIS — I739 Peripheral vascular disease, unspecified: Secondary | ICD-10-CM

## 2010-10-25 NOTE — Assessment & Plan Note (Signed)
Stable class 3 angina Improved with Ranexa

## 2010-10-25 NOTE — Assessment & Plan Note (Signed)
Some more foot pain with decreased pedal pulses.  ABI;s and LE arterial duplex

## 2010-10-25 NOTE — Assessment & Plan Note (Signed)
Well controlled.  Continue current medications and low sodium Dash type diet.    

## 2010-10-25 NOTE — Assessment & Plan Note (Signed)
Lab Results  Component Value Date   LDLCALC 68 08/12/2008  At goal with no side effects

## 2010-10-25 NOTE — Patient Instructions (Addendum)
Your physician recommends that you schedule a follow-up appointment in: 3 months with Dr. Eden Emms Your physician has requested that you have a lower extremity arterial exercise duplex. During this test, exercise and ultrasound are used to evaluate arterial blood flow in the legs. Allow one hour for this exam. There are no restrictions or special instructions. 443.9

## 2010-10-25 NOTE — Progress Notes (Signed)
Carla Friedman is seen today for CAD, HTN, and elevated lipids.  She has had CABG with stents to the native LAD and SVG to OM.  She has distal disease and chronic chest pain.  She actually is seen by hospice.  She prefers not to be seen in hospital alot.    She has less chest pain off lisinopril and on Atenolol.  We have not started Ranexa.  Her last cath was in 11/2006 and she had a non-ischemic myovue in 11/2007.  She has morphine and oxygen at home.  She has sig. depression.  She looked very good today  I am not convinced that all of her pains are anginal but she seems satisfied to have oxygen at home.    She also complains of SOB.  it does not sound like volume there is no PND, orthopnea, or edema. She continues to be depressed.  She uses oxygen and nitro as a crutch but this has helped her over the years.  She has significant venous insuf with marked dependant discoloration but no arterial insuf.  Ranexa has helped her quite a bit.   ROS: Denies fever, malais, weight loss, blurry vision, decreased visual acuity, cough, sputum, SOB, hemoptysis, pleuritic pain, palpitaitons, heartburn, abdominal pain, melena, lower extremity edema, claudication, or rash.   General: Affect appropriate Healthy:  appears stated age HEENT: normal Neck supple with no adenopathy JVP normal no bruits no thyromegaly Lungs clear with no wheezing and good diaphragmatic motion Heart:  S1/S2 no murmur,rub, gallop or click PMI normal Abdomen: benighn, BS positve, no tenderness, no AAA Femoral  bruit.  No HSM or HJR Distal pulses diminished No edema Neuro non-focal Skin warm and dry No muscular weakness   Current Outpatient Prescriptions  Medication Sig Dispense Refill  . amitriptyline (ELAVIL) 50 MG tablet Take 50 mg by mouth at bedtime.        . Ascorbic Acid (VITAMIN C) 500 MG tablet Take 500 mg by mouth daily.        Marland Kitchen aspirin 81 MG tablet Take 81 mg by mouth daily.        Marland Kitchen atenolol (TENORMIN) 50 MG tablet Take 50  mg by mouth daily.        . Calcium Carbonate (CALCIUM 600 PO) Take by mouth. 1 po daily       . Cholecalciferol (VITAMIN D) 1000 UNITS capsule Take 1,000 Units by mouth daily.        . clopidogrel (PLAVIX) 75 MG tablet Take 75 mg by mouth daily.        . insulin glargine (LANTUS SOLOSTAR) 100 UNIT/ML injection Inject into the skin. 10 UNITS Use as directed      . isosorbide dinitrate (ISORDIL) 30 MG tablet Take 30 mg by mouth 2 (two) times daily.        . metFORMIN (GLUCOPHAGE) 1000 MG tablet Take 1,000 mg by mouth 2 (two) times daily with a meal.        . Multiple Vitamin (MULTIVITAMIN) capsule Take 1 capsule by mouth daily.        . nitroGLYCERIN (NITROSTAT) 0.4 MG SL tablet Place 0.4 mg under the tongue every 5 (five) minutes as needed.        Marland Kitchen oxycodone (OXY-IR) 5 MG capsule Take 5 mg by mouth every 4 (four) hours as needed.        . pravastatin (PRAVACHOL) 40 MG tablet Take 40 mg by mouth daily.        . promethazine (PHENERGAN)  25 MG tablet Take 25 mg by mouth every 6 (six) hours as needed.        . ramipril (ALTACE) 10 MG capsule Take 1 capsule (10 mg total) by mouth daily.  30 capsule  11  . ranolazine (RANEXA) 500 MG 12 hr tablet Take 500 mg by mouth daily.        Marland Kitchen DISCONTD: BEE POLLEN PO Take by mouth. 1 po daily         Allergies  Alprazolam; Amoxicillin; Cephalexin; Codeine; Codeine phosphate; Doxycycline; Hydrochlorothiazide; Morphine; Morphine sulfate; Niacin; and Omeprazole  Electrocardiogram:    Assessment and Plan

## 2010-10-31 ENCOUNTER — Telehealth: Payer: Self-pay | Admitting: *Deleted

## 2010-10-31 NOTE — Telephone Encounter (Signed)
Call in Cipro 500 mg bid for 7 days  

## 2010-10-31 NOTE — Telephone Encounter (Signed)
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 Pt would like Cipro for possible UTI.  Nurse is calling, and any questions should be directed to [phone]

## 2010-11-01 MED ORDER — CIPROFLOXACIN HCL 500 MG PO TABS: 500.0000 mg | ORAL_TABLET | Freq: Two times a day (BID) | ORAL | Status: AC

## 2010-11-01 NOTE — Telephone Encounter (Signed)
rx called in and beth tracey notified.

## 2010-11-02 LAB — D-DIMER, QUANTITATIVE: D-Dimer, Quant: 0.66 ug/mL-FEU — ABNORMAL HIGH (ref 0.00–0.48)

## 2010-11-02 LAB — CK TOTAL AND CKMB (NOT AT ARMC)
CK, MB: 0.9 ng/mL (ref 0.3–4.0)
Relative Index: INVALID (ref 0.0–2.5)

## 2010-11-02 LAB — LIPID PANEL
LDL Cholesterol: 53 mg/dL (ref 0–99)
Total CHOL/HDL Ratio: 3.2 RATIO
VLDL: 22 mg/dL (ref 0–40)

## 2010-11-02 LAB — POCT I-STAT 3, VENOUS BLOOD GAS (G3P V)
Bicarbonate: 20.5 mEq/L (ref 20.0–24.0)
TCO2: 22 mmol/L (ref 0–100)
pCO2, Ven: 36.4 mmHg — ABNORMAL LOW (ref 45.0–50.0)
pH, Ven: 7.359 — ABNORMAL HIGH (ref 7.250–7.300)

## 2010-11-02 LAB — CBC
HCT: 29.4 % — ABNORMAL LOW (ref 36.0–46.0)
HCT: 30.6 % — ABNORMAL LOW (ref 36.0–46.0)
HCT: 32.8 % — ABNORMAL LOW (ref 36.0–46.0)
HCT: 34.2 % — ABNORMAL LOW (ref 36.0–46.0)
Hemoglobin: 10 g/dL — ABNORMAL LOW (ref 12.0–15.0)
Hemoglobin: 11.1 g/dL — ABNORMAL LOW (ref 12.0–15.0)
Hemoglobin: 11.4 g/dL — ABNORMAL LOW (ref 12.0–15.0)
MCHC: 33.4 g/dL (ref 30.0–36.0)
MCHC: 33.8 g/dL (ref 30.0–36.0)
MCHC: 34.1 g/dL (ref 30.0–36.0)
MCV: 79.3 fL (ref 78.0–100.0)
Platelets: 396 10*3/uL (ref 150–400)
Platelets: 424 10*3/uL — ABNORMAL HIGH (ref 150–400)
RBC: 4.31 MIL/uL (ref 3.87–5.11)
RDW: 14.7 % (ref 11.5–15.5)
RDW: 14.9 % (ref 11.5–15.5)
RDW: 15.4 % (ref 11.5–15.5)

## 2010-11-02 LAB — GLUCOSE, CAPILLARY
Glucose-Capillary: 103 mg/dL — ABNORMAL HIGH (ref 70–99)
Glucose-Capillary: 105 mg/dL — ABNORMAL HIGH (ref 70–99)
Glucose-Capillary: 135 mg/dL — ABNORMAL HIGH (ref 70–99)
Glucose-Capillary: 197 mg/dL — ABNORMAL HIGH (ref 70–99)
Glucose-Capillary: 99 mg/dL (ref 70–99)

## 2010-11-02 LAB — BASIC METABOLIC PANEL
CO2: 25 mEq/L (ref 19–32)
CO2: 26 mEq/L (ref 19–32)
Chloride: 105 mEq/L (ref 96–112)
Chloride: 108 mEq/L (ref 96–112)
GFR calc Af Amer: >60 mL/min (ref >60)
GFR calc non Af Amer: 56 mL/min — ABNORMAL LOW (ref >60)
Glucose, Bld: 121 mg/dL — ABNORMAL HIGH (ref 70–99)
Potassium: 3.9 mEq/L (ref 3.5–5.1)
Sodium: 138 mEq/L (ref 135–145)
Sodium: 140 mEq/L (ref 135–145)

## 2010-11-02 LAB — TSH: TSH: 0.007 u[IU]/mL — ABNORMAL LOW (ref 0.350–4.500)

## 2010-11-02 LAB — COMPREHENSIVE METABOLIC PANEL
ALT: 17 U/L (ref 0–35)
AST: 16 U/L (ref 0–37)
Albumin: 3 g/dL — ABNORMAL LOW (ref 3.5–5.2)
CO2: 24 mEq/L (ref 19–32)
Chloride: 109 mEq/L (ref 96–112)
Creatinine, Ser: 0.88 mg/dL (ref 0.4–1.2)
GFR calc Af Amer: >60 mL/min (ref >60)
Potassium: 3.8 mEq/L (ref 3.5–5.1)
Sodium: 142 mEq/L (ref 135–145)
Total Bilirubin: 0.5 mg/dL (ref 0.3–1.2)

## 2010-11-02 LAB — POCT CARDIAC MARKERS
CKMB, poc: <1 ng/mL — ABNORMAL LOW (ref 1.0–8.0)
Troponin i, poc: <0.05 ng/mL (ref 0.00–0.09)
Troponin i, poc: <0.05 ng/mL (ref 0.00–0.09)

## 2010-11-02 LAB — APTT
aPTT: 32 seconds (ref 24–37)
aPTT: 83 seconds — ABNORMAL HIGH (ref 24–37)

## 2010-11-02 LAB — POCT I-STAT, CHEM 8
BUN: 17 mg/dL (ref 6–23)
Calcium, Ion: 1.18 mmol/L (ref 1.12–1.32)
Chloride: 110 mEq/L (ref 96–112)
Glucose, Bld: 105 mg/dL — ABNORMAL HIGH (ref 70–99)
HCT: 33 % — ABNORMAL LOW (ref 36.0–46.0)
Potassium: 3.9 mEq/L (ref 3.5–5.1)

## 2010-11-02 LAB — CARDIAC PANEL(CRET KIN+CKTOT+MB+TROPI)
CK, MB: 0.8 ng/mL (ref 0.3–4.0)
Relative Index: INVALID (ref 0.0–2.5)
Total CK: 28 U/L (ref 7–177)
Troponin I: <0.01 ng/mL (ref 0.00–0.06)
Troponin I: <0.01 ng/mL (ref 0.00–0.06)

## 2010-11-02 LAB — POCT I-STAT 3, ART BLOOD GAS (G3+)
Acid-base deficit: 5 mmol/L — ABNORMAL HIGH (ref 0.0–2.0)
Bicarbonate: 19.9 mEq/L — ABNORMAL LOW (ref 20.0–24.0)
TCO2: 21 mmol/L (ref 0–100)

## 2010-11-02 LAB — T4, FREE: Free T4: 1.54 ng/dL (ref 0.80–1.80)

## 2010-11-02 LAB — HEPARIN LEVEL (UNFRACTIONATED): Heparin Unfractionated: 0.19 IU/mL — ABNORMAL LOW (ref 0.30–0.70)

## 2010-11-09 ENCOUNTER — Encounter: Payer: Medicare Other | Admitting: Cardiology

## 2010-11-14 ENCOUNTER — Encounter (INDEPENDENT_AMBULATORY_CARE_PROVIDER_SITE_OTHER): Payer: Medicare Other | Admitting: Cardiology

## 2010-11-14 DIAGNOSIS — L98499 Non-pressure chronic ulcer of skin of other sites with unspecified severity: Secondary | ICD-10-CM

## 2010-11-14 DIAGNOSIS — I739 Peripheral vascular disease, unspecified: Secondary | ICD-10-CM

## 2010-11-17 ENCOUNTER — Other Ambulatory Visit: Payer: Self-pay | Admitting: *Deleted

## 2010-11-17 MED ORDER — ISOSORBIDE DINITRATE 30 MG PO TABS: 30.0000 mg | ORAL_TABLET | Freq: Two times a day (BID) | ORAL | Status: DC

## 2010-11-18 ENCOUNTER — Encounter: Payer: Self-pay | Admitting: Cardiovascular Disease

## 2010-12-06 NOTE — Discharge Summary (Signed)
Carla Friedman, BRUNELLI              ACCOUNT NO.:  [id number]   MEDICAL RECORD NO.:  [id number]          PATIENT TYPE:  INP   LOCATION:  2012                         FACILITY:  [facility]   PHYSICIAN:  [name]. Eden Emms, MD, FACCDATE OF BIRTH:  Aug 12, 1933   DATE OF ADMISSION:  11/01/2008  DATE OF DISCHARGE:  11/05/2008                               DISCHARGE SUMMARY   ADDENDUM   This is addendum to previously dictated note [id number].   PRIMARY CARDIOLOGIST:  [name]. Eden Emms, MD, [facility]   PRIMARY CARE Osiel Stick:  Tera Mater. Clent Ridges, MD   Please note that the patient's primary care Jarad Barth on the initial  dictation was listed incorrectly.  Primary care Carisha Kantor is actually Dr.  Gershon Crane with [facility].      [name], ANP      [name]. Eden Emms, MD, [facility]  Electronically Signed    CB/MEDQ  D:  11/05/2008  T:  11/06/2008  Job:  [id number]   cc:   Jeannett Senior A. Clent Ridges, MD

## 2010-12-06 NOTE — Assessment & Plan Note (Signed)
[facility]                            CARDIOLOGY OFFICE NOTE   NAME:Carla Friedman, Carla Friedman                     MRN:          [id number]  DATE:01/02/2007                            DOB:          12-20-1933    This is a 77 year old white female patient who is seen post cath. She  has a history of coronary artery disease, status post coronary artery  bypass graft in 2001. She was admitted to the hospital with chest pain  and underwent cardiac catheterization on May 28, by Dr.  Samule Ohm. This  revealed widely patent bypass grafts and patent stents in both bypass  grafts to the marginal and the left anterior descending artery (LAD).  She has diffuse small vessel disease. However, it appears stable from  before. He did not feel her chest pain was cardiac related.   Since the patient has been home, she has done quite well. She is doing  much better since her diabetes and GI medications have been adjusted.  She has lost over 40 pounds in the past 9 months and is feeling great.  She denies any recurrent chest pain, shortness of breath, dizziness or  pre-syncope.   CURRENT MEDICATIONS:  1. Lipitor 20 mg daily.  2. Plavix 75 mg daily.  3. Amitriptyline 50 mg q nightly.  4. Multivitamin daily.  5. Vitamin D daily.  6. Lantus insulin as directed.  7. Aspirin 81 mg daily.  8. Imdur 30 mg daily.  9. Tricor 145 mg daily.  10.Altace 10 mg daily.  11.Metformin 1000 mg b.i.d.  12.Januvia 100 mg daily.  13.Furosemide 10 mg p.r.n.  14.Bee pollen daily.  15.Folic acid daily.   PHYSICAL EXAMINATION:  This is a very pleasant 89 -year-old white female  in no acute distress. Blood pressure 132/62, pulse 90, weight is 155.  NECK: Without JVD, HJR, bruit or thyroid enlargement.  LUNGS:  Clear anterior, posterior and lateral.  HEART: Regular rate and rhythm at 90 beats per minute. Normal S1, S2. No  murmur, rub, bruit, thrill or heave noted.  ABDOMEN: Soft without  organomegaly, masses, lesions or abnormal  tenderness.  Right groin stable without hematoma or hemorrhage.  EXTREMITIES: Without cyanosis, clubbing or edema. She has good distal  pulses.   IMPRESSION:  1. Coronary artery disease, cath on Dec 19, 2006 revealed widely      patent bypass grafts and patent stents in bypass graft to the      marginal and left anterior descending artery (LAD), small vessel      disease. Continue medical therapy.  2. Status post coronary artery bypass graft in 2001.  3. Status post drug eluting stent in the native left anterior      descending artery (LAD) and in the vein graft to the marginal in      2004.  4. Diabetes mellitus, type 2 with neuropathy.  5. Hypertension.  6. Hyperlipidemia.  7. Chronic back pain.  8. History of peptic ulcer disease and gastroesophageal reflux      disease.  9. Chronic back pain.  10.Peripheral vascular disease with claudication.  PLAN:  At this time, the patient is doing very well from a cardiac  standpoint. She can see Dr.  Eden Emms back in six months time or sooner if  needed.      Jacolyn Reedy, PA-C  Electronically Signed      Rollene Rotunda, MD, [facility]  Electronically Signed   ML/MedQ  DD: 01/02/2007  DT: 01/02/2007  Job #: [id number]

## 2010-12-06 NOTE — Discharge Summary (Signed)
NAMECARLETA, WOODROW              ACCOUNT NO.:  [id number]   MEDICAL RECORD NO.:  [id number]          PATIENT TYPE:  INP   LOCATION:  2012                         FACILITY:  [facility]   PHYSICIAN:  [name]. Eden Emms, MD, FACCDATE OF BIRTH:  1933/10/14   DATE OF ADMISSION:  11/01/2008  DATE OF DISCHARGE:  11/05/2008                               DISCHARGE SUMMARY   ADDENDUM:   PRIMARY CARE Erisa Mehlman:  Tera Mater. Clent Ridges, M.D.   This is an addendum to previously dictated discharge summary, job  [id number].   See previous dictation for complete details.   Prior to the patient's discharge, she had repeat thyroid testing  secondary to a low TSH.  Followup TSH this afternoon was 0.007.  Her  free T4 was normal at 1.54 and her T3 uptake was slightly elevated at  42.2.  Patient will follow up with Dr. Tera Mater. Clent Ridges within the next  week for further discussion and evaluation with regards to her possible  hyperthyroidism.      [name], ANP      [name]. Eden Emms, MD, [facility]  Electronically Signed    CB/MEDQ  D:  11/05/2008  T:  11/05/2008  Job:  [id number]   cc:   Jeannett Senior A. Clent Ridges, MD

## 2010-12-06 NOTE — Cardiovascular Report (Signed)
Carla Friedman, Carla Friedman              ACCOUNT NO.:  [id number]   MEDICAL RECORD NO.:  [id number]          PATIENT TYPE:  INP   LOCATION:  2910                         FACILITY:  [facility]   PHYSICIAN:  [name]. Eden Emms, MD, FACCDATE OF BIRTH:  08/05/1933   DATE OF PROCEDURE:  11/02/2008  DATE OF DISCHARGE:                            CARDIAC CATHETERIZATION   A 77 year old patient with old bypass grafts, increasing chest pain  after starting lisinopril.   Cine catheterization was done from the right femoral artery and vein.  Right heart catheterization is done due to the patient's shortness of  breath.   From previous cath, the patient was known to have a totally occluded  proximal RCA.  The left internal mammary artery was not used for bypass  grafting.   Left main coronary artery has a 20% discrete stenosis.   Left anterior descending artery had 20-30% mild discrete proximal  disease.  The mid LAD had a widely patent stent, distal LAD had 30%  multiple discrete stenoses.   First diagonal branch of 30-40% mild discrete disease.  Second diagonal  branch was small.  There was a 40% ostial lesion at the takeoff of the  stented mid LAD.  Circumflex coronary artery had 90% diffuse proximal  disease.  There was antegrade filling to a medium-size branching OM  branch.  The vein graft to the circ, then filled retrograde and was  occluded and thrombosed at a stent site.  The distal AV groove branch  was small and filled normally.   The right coronary artery was known to be occluded proximally.   The saphenous vein graft to the right coronary artery was patent.  The  PDA and PLA were diffusely small vessels, saphenous vein graft to the OM  was 100% occluded after a ventriculography, however, ventriculography  showed normal EF of 60%.  There is no MR.  Aortic pressure is 191/83, LV  pressure was 191/4, right heart catheterization showed a mean right  atrial pressure of 3, RV pressure is 26/4.   PA pressure 26/6, mean  pulmonary capillary wedge pressure was 2.  The patient's filling  pressures were quite low.   IMPRESSION:  The films were reviewed with Dr. Riley Kill. and Dr. Excell Seltzer  There is no easy way to revascularize the circumflex distribution.  The  vein graft is filled with thrombus including a thrombosed stent.  The  native RCA is diffusely diseased over a long area and extremely small,  particularly in a diabetic.   We both agreed that an initial attempt at medical therapy would be  warranted.   Of more concern is the fact that the patient's general mental status has  been declining.  Her shortness of breath is not explained and her  overall functional status is decreased.  This would make me even less  likely to try high risk intervention in regards to recanalization of the  circ distribution.   The patient tolerated the procedure well.  She did receive Versed and  fentanyl for back pain.   At the case, her groin site looked fine with no hematoma.  [name]. Eden Emms, MD, [facility]  Electronically Signed     PCN/MEDQ  D:  11/02/2008  T:  11/03/2008  Job:  [id number]

## 2010-12-06 NOTE — Assessment & Plan Note (Signed)
[facility]                            CARDIOLOGY OFFICE NOTE   NAME:Friedman, Carla BULLER                     MRN:          [id number]  DATE:06/03/2008                            DOB:          29-May-1934    Carla Friedman returns today for followup.  She is doing extremely well.  She had  cataract surgery done and her eyesight is much improved.  She had  previous bypass surgery in August 2001.  She had a saphenous vein graft  to circ and vein graft to the RCA.  This was done in [place].  She  subsequently is needed a stent to the OM graft and stent to the native  LAD done in 2008.  She has not had any recurrent chest pain.  Actually  her last Myoview was done on Dec 20, 2007 and was low-risk with breast  attenuation.  No ischemia.  EF was 81%.   Carla Friedman has continued to lose a little bit of weight.  She feels as good  as she has felt in 8 years.   She is not having taken any nitroglycerin.  She has been compliant with  her meds.   REVIEW OF SYSTEMS:  Otherwise negative.  she is on:  1. An aspirin a day.  2. Plavix 75 a day.  3. Isordil 30 a day.  4. TriCor 145 a day.  5. Altace 10 a day.  6. Metformin 1 g b.i.d.  7. Amitriptyline 50 nightly.  8. Lipitor 20 a day.  9. Lantus as directed.  10.Multivitamins.   PHYSICAL EXAMINATION:  GENERAL:  Remarkable for an elderly white female  who does look much better particularly in the face where she is very  bright-eyed.  VITAL SIGNS:  Her weight is 154, blood pressure 130-80/67, pulse 87,  respiratory rate 40, and afebrile.  HEENT:  Unremarkable.  NECK:  Carotids are normal without bruit.  No lymphadenopathy,  thyromegaly, or JVP elevation.  LUNGS:  Clear.  Good diaphragmatic  motion.  No wheezing.  CARDIAC:  S1 and S2 with systolic ejection murmur.  PMI normal.  ABDOMEN:  Benign.  Bowel sounds positive.  No AAA.  No tenderness.  No  bruit.  No hepatosplenomegaly.  No hepatojugular reflux.  EXTREMITIES:   Distal pulses are intact.  No edema.  NEURO:  Nonfocal.  SKIN:  Warm and dry.  No muscular weakness.   IMPRESSION:  1. Distant history of coronary artery bypass graft in 2001, stent to      the native left anterior descending and saphenous vein graft to the      OM stable, low-risk Myoview in May.  Continue aspirin, Plavix, and      beta-blocker.  2. Hypertension.  Currently, well-controlled.  Continue current low-      sodium diet as well as Altace.  3. Diabetes.  Followup primary care MD.  Hemoglobin A1c quarterly.  4. Hypercholesterolemia.   Continue Lipitor or TriCor.  Lipid and liver profile in 6 months.   Overall, Carla Friedman is doing well.  I will see her back in 6 months.  [name]. Eden Emms, MD, [facility]  Electronically Signed    PCN/MedQ  DD: 06/03/2008  DT: 06/03/2008  Job #: [id number]

## 2010-12-06 NOTE — Assessment & Plan Note (Signed)
[facility]                            CARDIOLOGY OFFICE NOTE   NAME:Carla Friedman                     MRN:          [id number]  DATE:06/03/2007                            DOB:          January 16, 1934    SUBJECTIVE:  [name] returns today.  She is status post  previous coronary artery bypass graft surgery.  She has had stenting of  the native LAD and the vein graft to the OM.  She is doing well.  Her  diabetes has been under good control.  Her hemoglobin A1c has been 6.   The patient's lower extremity edema is markedly improved.  I believe  this is due to weight loss.  She has gone from 182 pounds, down to 159  pounds.  I congratulated her on this.   Her last cardiac catheterization was in May 2008, with patent stents to  the native LAD and vein graft to the OM.  Medical therapy warranted.   REVIEW OF SYSTEMS:  Remarkable for occasional chest pain when sweeping.  These are very isolated episodes.  I doubt that they represent angina.  Otherwise her review of systems is negative.   CURRENT MEDICATIONS:  1. Aspirin daily.  2. Plavix 75 mg daily.  3. Isosorbide 30 mg daily.  4. Tri-Chlor 145 mg daily.  5. Januvia 100 mg daily.  6. Altace 10 mg daily.  7. Metformin 1 gram twice daily.  8. Amitriptyline 50 mg q.h.s.  9. Lipitor 20 mg daily.  10.Lantus insulin as directed.  11.Bee pollen and calcium.   PHYSICAL EXAMINATION:  GENERAL:  Remarkable for a healthy-appearing  elderly white female, in no distress.  Affect is appropriate.  VITAL SIGNS:  Weight 159 pounds, blood pressure 140/70, pulse 80 and  regular, afebrile, respirations 14.  HEENT:  Unremarkable.  NECK:  Carotids normal without bruits.  No lymphadenopathy, no  thyromegaly.  No JVP elevation.  LUNGS:  Clear with diaphragmatic motion.  No wheezing.  HEART:  S1 and S2 with normal heart sounds.  PMI normal.  ABDOMEN:  Benign.  Bowel sounds present.  No hepatosplenomegaly.   No  hepatojugular reflux.  No tenderness, no abdominal aortic aneurysm.  EXTREMITIES:  Faint bilateral femoral bruits.  PTs +2 bilaterally.  NEUROLOGIC:  Nonfocal.  No muscular weakness.   IMPRESSION:  1. Previous coronary artery bypass graft surgery with a stent to the      vein graft  to the obtuse marginal and native left anterior      descending coronary artery, currently without significant angina:      A recent cardiac catheterization in May looked fine.  Continue      aspirin and beta blocker.  Follow-up Myoview in one year.  2. Hyperlipidemia:  Continue statin drug.  Follow up lipid and liver      profile in six months.  3. Peripheral vascular disease:  I will have to go back through our      records to see she is not having claudication.  She has previously      and I suspect we will check  ABIs in one year.  4. Diabetes:  Continue good control.  Hemoglobin A1c in the target      range.  Follow up with her primary care MD, Dr. Nelwyn Salisbury.  5. Weight loss:  I congratulated the patient.  She will continue a      strict low-calorie diabetic diet.  Her lower extremity edema is      resolved.   ADDENDUM:  The electrocardiogram read today:  Sinus rhythm with left  axis deviation.  No acute changes.     [name]. Eden Emms, MD, [facility]  Electronically Signed    PCN/MedQ  DD: 06/03/2007  DT: 06/03/2007  Job #: [id number]

## 2010-12-06 NOTE — Discharge Summary (Signed)
Carla Friedman, Carla Friedman              ACCOUNT NO.:  [id number]   MEDICAL RECORD NO.:  [id number]          PATIENT TYPE:  INP   LOCATION:  2012                         FACILITY:  [facility]   PHYSICIAN:  [name]. Eden Emms, MD, FACCDATE OF BIRTH:  [birth year]   DATE OF ADMISSION:  11/01/2008  DATE OF DISCHARGE:  11/05/2008                               DISCHARGE SUMMARY   ADDENDUM:  We have opted to check repeat TSH, free T4 and T3 uptake  while patient is here.  These results will need to be faxed to Dr.  Pablo Lawrence office.  Patient will be discharged home following laboratory  work this morning.      [name], ANP      [name]. Eden Emms, MD, [facility]  Electronically Signed    CB/MEDQ  D:  11/05/2008  T:  11/05/2008  Job:  [id number]

## 2010-12-06 NOTE — Discharge Summary (Signed)
Carla Friedman, Carla Friedman              ACCOUNT NO.:  [id number]   MEDICAL RECORD NO.:  [id number]          PATIENT TYPE:  INP   LOCATION:  2012                         FACILITY:  [facility]   PHYSICIAN:  Carla Friedman, Carla Friedman, Carla Friedman:  04/16/1934   DATE OF ADMISSION:  11/01/2008  DATE OF DISCHARGE:  11/05/2008                               DISCHARGE SUMMARY   PRIMARY CARDIOLOGIST:  Carla Friedman, Carla Friedman, [name]   PRIMARY CARE Carla Friedman:  Carla Friedman.   DISCHARGE DIAGNOSIS:  Unstable angina.   SECONDARY DIAGNOSES:  1. Coronary artery disease status post coronary artery bypass graft x2      in 2001 with vein graft to the RCA and vein graft to the obtuse      marginal.  The patient has also had PCI and stenting of the vein      graft to the obtuse marginal and mid and distal LAD in 2002.  2. Hypertension.  3. Hyperlipidemia.  4. Type 2 diabetes mellitus.  5. Depression.  6. Chronic back pain.  7. Peptic ulcer disease.  8. Peripheral vascular disease with claudication.  9. Remote tobacco abuse quitting in 1992 after a 78 pack year history.   ALLERGIES:  ALPRAZOLAM, VICRYL SUTURES, MORPHINE SULFATE, NIASPAN,  CODEINE, HCTZ, OXYCODONE, OMEPRAZOLE, DOXYCYCLINE, METOPROLOL.   PROCEDURES:  Right and left heart cardiac catheterization performed  April 12 revealing normal right heart pressures and an occluded vein  graft to the obtuse marginal.  The native circumflex had a 90% proximal  stenosis.  However, this was a small and diffusely diseased vessel.  EF  was 60%.  Films were reviewed and medical therapy was recommended.   HISTORY OF PRESENT ILLNESS:  A 77 year old Caucasian female with the  above problem list.  Since late 2009 she began experiencing intermittent  daily exertional right-sided chest pressure without associated symptoms  lasting hours at a time and resolving spontaneously.  Due to this she  was placed on metoprolol therapy, however, she developed diarrhea and  this was  subsequently discontinued.  On the evening prior to admission  she awoke at 11:30 p.m. with 8/10 substernal chest pressure radiating to  the left neck and down the left arm without associated symptoms.  Her  symptoms persisted throughout the evening and earlier next morning  prompting her to present to the [facility] ED.  There ECG showed no  acute changes and troponin was negative.  She was admitted for further  evaluation.   Friedman COURSE:  The patient ruled out for MI.  She had no recurrent  chest discomfort and was scheduled for right and left heart cardiac  catheterization on April 12 which was performed revealing normal right  heart pressures, normal LV function and an occluded vein graft to the  obtuse marginal which was new since her last catheterization.  The films  were reviewed for possible PCI of the left circumflex which had a 90%  proximal stenosis, however, this was felt to be diffusely diseased as  well as a small vessel and thus a poor candidate for PCI.  Medical  therapy was  recommended.  As such the patient was placed on beta-blocker  therapy and her Imdur which she appears to be taking at home was  increased to 30 mg b.i.d.  She was also maintained on aspirin, Plavix  and statin therapy.  She has had no recurrent chest discomfort and has  been ambulating without difficulty.  She has been evaluated by cardiac  rehab and overall has done well.  We plan to discharge her home today in  good condition.   DISCHARGE LABS:  Hemoglobin 10.2, hematocrit 30.6, WBC 6.8, platelets  424, INR 1.1, D-dimer 0.66.  Sodium 140, potassium 3.8, chloride 108,  CO2 25, BUN 8, creatinine 0.97, glucose 121, total bilirubin 0.5,  alkaline phosphatase 54, AST 16, ALT 17, total protein 5.8, albumin 3.0,  calcium 9.0, CK 28, MB 1.1, troponin-I less than 0.01, total cholesterol  109, triglycerides 111, HDL 34, LDL 53.  TSH 0.007.   DISPOSITION:  The patient will be discharged home today in  good  condition.   FOLLOWUP PLANS AND APPOINTMENTS:  We have asked her to follow up with  Carla Friedman within the next week for additional laboratory evaluation of  possible hyperthyroidism with a TSH of 0.007 noted during this  admission.  We have supplied her with a prescription for free T4 and T3  uptake.  She is to follow up with Carla Friedman on April 28 at 4:15 p.m.   DISCHARGE MEDICATIONS:  1. Aspirin 81 mg daily.  2. Plavix 75 mg daily.  3. Imdur 30 mg b.i.d.  4. Coreg 12.5 mg b.i.d.  5. Lipitor 20 mg q.h.s.  6. TriCor 145 mg q.h.s.  7. Lantus 10 units q.h.s.  8. Elavil 50 mg q.h.s.  9. Ramipril 10 mg daily.  10.Multivitamin daily.  11.Nitroglycerin 0.4 mg sublingual p.r.n. chest pain.  12.Zinc, folic acid, calcium, vitamin D and magnesium all as      previously prescribed.   OUTSTANDING LAB STUDIES:  Free T4 and T3 uptake.   DURATION DISCHARGE ENCOUNTER:  60 minutes including physician time.      Carla Friedman, Carla Friedman      Carla Friedman, Carla Friedman, Carla Friedman  Electronically Signed   CB/MEDQ  D:  11/05/2008  T:  11/05/2008  Job:  [id number]   cc:   Dr Foy Friedman

## 2010-12-06 NOTE — Discharge Summary (Signed)
Carla Friedman, VENTRESS NO.:  [id number]   MEDICAL RECORD NO.:  [id number]          PATIENT TYPE:  INP   LOCATION:  3705                         FACILITY:  [facility]   PHYSICIAN:  Salvadore Farber, MD  DATE OF BIRTH:  1934/06/08   DATE OF ADMISSION:  12/18/2006  DATE OF DISCHARGE:  12/19/2006                               DISCHARGE SUMMARY   DISCHARGING PHYSICIAN:  Dr. Randa Evens.   PRIMARY CARDIOLOGIST:  Dr. Maurine Cane.   DISCHARGE DIAGNOSIS:  Chest pain.  The patient ruled out for myocardial  infarction status post cardiac catheterization this admission by Dr.  Samule Ohm on Dec 19, 2006, showing patent grafts with suspicion for  noncardiac chest pain.   PAST MEDICAL HISTORY:  1. Coronary artery disease status post CABG in 2001, status post cath      in 2005.  2. Diabetes type 2 with neuropathy.  3. Hypertension.  4. Hyperlipidemia.  5. Depression.  6. Chronic back pain.  7. History of peptic ulcer disease.  8. Peripheral vascular disease with claudication  9. History of tobacco use, quit 1992.   HOSPITAL COURSE:  Ms. Rizo is a very pleasant 77 year old Caucasian  female with known history of coronary artery disease status post CABG in  2001 in [place] with cardiac catheterization in May 2005 which showed  30% stenosis in the left main.  LAD had multiple stents with mid distal  40% in-stent stenosis and a 50% in-stent stenosis in the distal LAD.  There was normal TIMI flow.  First diagonal had a 40% multiple discrete  lesions.  RCA 100% occluded.  Circumflex 100% occluded.  Saphenous vein  graft to the obtuse marginal 40% in-stent restenosis.  Saphenous vein  graft to the RCA was widely patent.  LIMA widely patent and was not used  for the CABG.  Ms. Meacham also has a history of type 2 diabetes,  hypertension, hyperlipidemia.  She presented to [facility] emergency  room on day of admission complaining of chest discomfort, not relieved  with  sublingual nitroglycerin.  She felt it was similar to her pain  prior to her bypass in 2001.  The patient was admitted.  Cardiac markers  were cycled.  The patient ruled out for myocardial infarction.  However  in the setting of known coronary artery disease, it was the patient  needed further evaluation.  She was taken to the cath lab on Dec 19, 2006, by Dr. Samule Ohm.  Results as stated above.  Post cath, the patient  without complications, being discharged home tentatively this evening  after bed rest completed and bicarb protocol completed.  Cath site  stable at time of discharge.  Patient afebrile.  The patient given post  cardiac catheterization discharge instructions.  She is to follow up  with Dr. [name]  PA on June 11 at 12:15 for post cath check.  include Lipitor 20 mg  daily, Plavix 75, Elavil 50 q.h.s., Tricor 145, Januvia 100, ramipril 10  mg daily, aspirin 81 mg daily, isosorbide mononitrate 30 mg daily,  metformin.  The patient is instructed not to resume  until Dec 22, 2006.  Duration of discharge encounter 25 minutes.      Dorian Pod, ACNP      Salvadore Farber, MD  Electronically Signed    MB/MEDQ  D:  12/19/2006  T:  12/20/2006  Job:  [id number]

## 2010-12-06 NOTE — Assessment & Plan Note (Signed)
[facility]                            CARDIOLOGY OFFICE NOTE   NAME:Carla Friedman, Carla Friedman                     MRN:          [id number]  DATE:12/03/2007                            DOB:          1934-06-11    Ameah returns today for follow-up.  She had a history of coronary bypass  surgery in August 2001.  She had a saphenous vein graft to the  circumflex, a vein graft to the RCA.  This was done in [place].   She subsequently had problems.  She needed a stent to the vein graft to  the OM and the stent in the native LAD.  This was done in 2008.   She has had recurrent chest pain in the last couple of months.  It is  not necessarily typical of angina.  It does tend to be more exertional.  It is fleeting.  She has not taken nitroglycerin for it.  It has not  been progressive.   It is not associated with any diaphoresis or shortness of breath.   The patient has on review of systems had some right hip pain.  This  seems to be improved.  Other than this, she has lost weight.  She has  cut back on her diabetes medicine and is no longer taking Actos or  Januvia.  Her blood pressure pills have also been cut back.   She is currently on:  1. Aspirin a day.  2. Plavix 75 mg a day.  3. Isosorbide 30mg  a day.  4. Tricor 145 mg a day.  5. Altace 10 mg a day.  6. Metformin 1 g b.i.d.  7. Amitriptyline 50 mg nightly.  8. Lipitor 20 mg a day.  9. Lantus as directed.  10.Bee pollen.  11.Calcium.  12.Folic acid.   EXAM:  Remarkable for a healthy-appearing, elderly white female in no  distress.  Weight is 158, blood pressure is 120/72, pulse 82 and regular, afebrile,  respiratory rate 14.  HEENT:  Unremarkable.  She has a faint left carotid bruit.  LUNGS:  Clear with good diaphragmatic motion.  No wheezing.  S1, S2.  Normal heart sounds.  PMI normal.  ABDOMEN:  Benign.  Bowel sounds positive.  No AAA, no tenderness, no  hepatosplenomegaly or hepatojugular  reflux.  Distal pulses are intact, no edema.  NEUROLOGIC:  Nonfocal.  SKIN:  Warm and dry.  No muscular weakness.   IMPRESSION:  Previous coronary artery bypass grafting with stents to the  left anterior descending and circumflex graft.  Chest pain not clearly  angina.  Follow up stress Myoview.  Continue aspirin and Plavix.  1. Hyperlipidemia.  Continue Tricor  and Lipitor.  Lipid and liver      profile in 6 months.  2. Diabetes, currently improved, off of Actos now.  Continue to follow      up with Dr. Clent Ridges.  Hemoglobin A1c was apparently 6.6, which is      reasonable.  3. Peripheral neuropathy from diabetes.  Continue amitriptyline 50 mg      nightly.  This seems to be improved.  4. Faint left carotid bruit.  Consider follow-up duplex in 6 months.  5. Hypertension, currently well-controlled.  Continue Altace 10 mg a      day, particularly in light of diabetes.  Her EKG today was entirely      normal.     Peter C. Eden Emms, MD, [facility]  Electronically Signed    PCN/MedQ  DD: 12/03/2007  DT: 12/03/2007  Job #: [id number]   cc:   Jeannett Senior A. Clent Ridges, MD

## 2010-12-06 NOTE — Cardiovascular Report (Signed)
Carla Friedman, SILLAS NO.:  [id number]   MEDICAL RECORD NO.:  [id number]          PATIENT TYPE:  INP   LOCATION:  3705                         FACILITY:  [facility]   PHYSICIAN:  Salvadore Farber, MD  DATE OF BIRTH:  16-Nov-1933   DATE OF PROCEDURE:  12/19/2006  DATE OF DISCHARGE:                            CARDIAC CATHETERIZATION   PROCEDURE:  Left heart catheterization, coronary and bypass graft  angiography.   INDICATIONS:  [name] is a 77 year old woman with coronary disease  for which she is status post coronary artery bypass grafting in 2001.  She has had drug-eluting stents both in the native LAD and in the vein  graft to the marginal in 2004.  She has done very nicely since then.  However, she now presents with an episode of chest discomfort lasting an  hour.  She had a recurrence the following day.  Both were nonexertional.  Electrocardiogram shows nonspecific ST-T abnormalities which were not  dynamic.  Cardiac enzymes were normal.  She was referred for diagnostic  angiography.   PROCEDURAL TECHNIQUE:  Informed consent was obtained.  Under 1%  lidocaine local anesthesia, a 5-French sheath was placed in the right  common femoral artery using the modified Seldinger technique.  Diagnostic angiography of the native system was performed using JL-4 and  JR-4 catheters.  Both vein grafts were selectively engaged using a JR-4  catheter.  Left heart catheterization was performed using a pigtail  catheter.  Left ventriculography was not performed to minimize contrast  use given her renal insufficiency.  The patient was then transferred to  the holding room in stable condition, having tolerated the procedure  well.   COMPLICATIONS:  None.   FINDINGS:  1. LV:  137/5/12.  2. No aortic stenosis on pullback.  3. Left main:  Angiographically normal.  4. LAD:  Moderate-sized vessel.  There are previously placed stents in      the mid vessel.  There is no  in-stent restenosis.  The single      diagonal was widely patent.  5. Circumflex:  Occluded proximally.  There is a vein graft to the      first marginal.  This vein graft has a widely patent stent in its      distal section.  There is excellent distal runoff though the vessel      native vessel is quite small.  6. RCA:  Vessel is occluded proximally.  There is a patent vein graft      to the distal vessel which is widely patent with excellent distal      runoff.   IMPRESSION/RECOMMENDATIONS:  Widely patent bypass grafts and patent  stents in both the bypass graft to the marginal and in the left anterior  descending artery.  She does have diffuse small-vessel disease.  However, it appears stable from before.  I suspect a noncardiac etiology  to her chest pain.  Plan on discharge home later today with continuation  of her prior medical therapy.      Salvadore Farber, MD  Electronically Signed     WED/MEDQ  D:  12/19/2006  T:  12/19/2006  Job:  [id number]   cc:   [name]. Eden Emms, MD, [facility]

## 2010-12-06 NOTE — Assessment & Plan Note (Signed)
[facility]                            CARDIOLOGY OFFICE NOTE   NAME:Carla Friedman                     MRN:          [id number]  DATE:10/01/2008                            DOB:          [birth year]    HISTORY OF PRESENT ILLNESS:  Carla Friedman is seen today as an add-on.  She is  77 years old and she called Dr. Pablo Lawrence office today because she was not  tolerating new medicine for blood pressure.  She has been having  atypical chest pain since being placed on lisinopril and this is listed  in the packets inserted, the side effect.  She does have coronary  disease with a previous CABG and stenting of the native RCA, I believe   The pain is different than atypical angina.  She had a nonischemic  Myoview in May 2009 with a normal EF.   She has sharp pains that are right-sided.  She has felt malaise and  fatigue, and she has not felt well since being started on lisinopril.  She clearly relates her symptoms to starting the drug.  Her review of  systems remarkable for no syncope, no fever, no cough, and no sputum  production.  She has lost over 30 pounds since stopping her Actos.  She  has not had any lower extremity edema, abdominal pain, melena, nausea,  or vomiting.   ALLERGIES:  She has multiple drug intolerances.  When I asked her about  them, the reason for stopping some of these drugs are not clear.  She describes allergies to HYDROCHLOROTHIAZIDE, OXYCODONE, and COREG is  listed, although she does not remember the problem with this,  OMEPRAZOLE, DOXYCYCLINE, NIASPAN, and ALPRAZOLAM.   MEDICATIONS:  The patient has been on Toprol in the past and it was  stopped due to expense.  She is currently on lisinopril, which she  stopped taking due to her concern that it was causing chest pain.  Fenofibrate, vitamin D, calcium, magnesium, vitamin C, aspirin, Plavix  75 a day, isosorbide 30 a day, metformin 1 g b.i.d., amitriptyline,  Lipitor 20 a day, and Lantus  as directed.   PHYSICAL EXAMINATION:  GENERAL:  Remarkable for an elderly female, in no  distress.  VITAL SIGNS:  Weight is down 158, blood pressure 120/72 it is not  postural, pulse 82 and regular, respiratory rate 14, and afebrile.  HEENT:  Unremarkable.  NECK:  Carotids are normal without bruit.  No lymphadenopathy,  thyromegaly, or JVP elevation.  No bruit.  LUNGS:  Clear.  Good diaphragmatic motion.  No wheezing.  S1 and S2.  Normal heart sounds.  PMI normal.  ABDOMEN:  Bowel sounds positive.  No AAA, no tenderness, no bruits, no  hepatosplenomegaly, and no hepatojugular reflux.  EXTREMITIES:  Distal pulses are intact.  No edema.  NEUROLOGIC:  Nonfocal.  SKIN:  Warm and dry.  MUSCULOSKELETAL:  No muscular weakness.   EKG shows sinus rhythm with no acute changes.   IMPRESSION:  1. Hypertension, not tolerating lisinopril.  Start Lopressor 50 b.i.d.      in generic form.  Followup next week.  2. Chest pain, atypical.  Clearly related to lisinopril.  However, she      does have known coronary artery disease.  Her EKG has no acute      changes.  Her Myoview in May 2009 was benign without ischemia.  We      will continue her current medical therapy including nitrates,      aspirin and Plavix.  Adding a beta-blocker should help. We will see      her back next week to reassess this.  3. Hyperlipidemia.  Continue statin, lipid and liver profile in 6      months.  4. Diabetes.  Hemoglobin A1c quarterly.  Follow up Dr. Foy Guadalajara.  I was      glad see that she stopped her Actos and has lost a lot of fluid      weight.     Carla Friedman. Carla Emms, MD, [facility]  Electronically Signed    PCN/MedQ  DD: 10/01/2008  DT: 10/02/2008  Job #: [id number]

## 2010-12-06 NOTE — H&P (Signed)
Carla Friedman, DELISI              ACCOUNT NO.:  [id number]   MEDICAL RECORD NO.:  [id number]          PATIENT TYPE:  INP   LOCATION:  3715                         FACILITY:  [facility]   PHYSICIAN:  Rollene Rotunda, MD, FACCDATE OF BIRTH:  [birth year]   DATE OF ADMISSION:  11/01/2008  DATE OF DISCHARGE:                              HISTORY & PHYSICAL   PRIMARY CARDIOLOGIST:  Carla Arista C. Eden Emms, MD, [facility].   PRIMARY CARE Carla Friedman:  Dr. Annitta Friedman.   PATIENT PROFILE:  A 77 year old Caucasian female with prior history of  CAD status post CABG who presents with unstable angina.   PROBLEM LIST:  1. Unstable angina/CAD.      a.     Status post CABG x2 in 2001 with vein graft to the RCA, and       vein graft to the obtuse marginal.      b.     On June 26, 2001, successful PCI and bare-metal stenting       in the vein graft to the obtuse marginal.      c.     On July 05, 2001, bare-metal stenting of the mid and       distal LAD complicated by perforation of the LAD.      d.     On Dec 11, 2006, cardiac catheterization left main normal,       LAD patent stent left circumflex 100% proximal.  RCA 100% proximal       vein graft to the obtuse marginal and RCA were patent.      e.     Nonischemic Myoview in May 2009.  2. Hypertension.  3. Hyperlipidemia.  4. Type 2 diabetes mellitus.  5. Depression.  6. Chronic back pain.  7. Peptic ulcer disease.  8. PVD with claudication.  9. Remote tobacco abuse quitting in 1992.   HISTORY OF PRESENT ILLNESS:  A 77 year old Caucasian female with the  above problems.  Since Christmas of 2009, she has been experiencing  intermittent daily  exertional right-sided chest pressure without  associated symptoms lasting hours, and at times resolving spontaneously.  She saw Dr. Eden Friedman on March 10, and was placed on metoprolol therapy.  However, she unfortunately had no improvement in her degree of chest  discomfort.  Of note, her Myoview May 2009 was also reviewed at  that  visit and was felt to be reassuring.  On metoprolol therapy, the patient  began to experience diarrhea approximately 3 weeks ago which has been  more or less constant over that period of time.  She stopped it earlier  this week after touching base with Dr. [name] nurse and she has  resumed verapamil therapy.  Diarrhea stopped yesterday.   Last night, she awoke at 11:30 p.m. with 8/10 substernal chest pressure  radiating to the left neck and down the left arm.  There was no  associated dyspnea, nausea, vomiting or diaphoresis.  Symptoms persisted  all night.  She was very restless.  She presents to the ED at 8:30 this  morning with history of IV nitroglycerin with complete pain relief at  approximately 1 hour of the infusion.  Cardiac monitor was currently  negative and ECGs without acute changes.   ALLERGIES:  ALPRAZOLAM, VICRYL SUTURES, MORPHINE SULFATE causes itching  and rash.  NIASPAN, CODEINE causes nausea and vomiting. HCTZ, OXYCODONE,  OMEPRAZOLE, DOXYCYCLINE, METOPROLOL and COREG.   HOME MEDICATIONS:  1. Aspirin 81 mg daily.  2. Plavix 75 mg daily.  3. Imdur 30 mg daily.  4. TriCor 145 mg daily.  5. Metformin 1000 mg b.i.d.  6. Amitriptyline 50 mg at bedtime.  7. Lipitor 20 mg daily.  8. Lantus 10 units at bedtime.  9. Multivitamins daily.  10.Zinc.  11.Folic acid.  12.Calcium.  13.Altace 10 mg daily.  14.Vitamin D.  15.Magnesium.   FAMILY HISTORY:  Mother died of COPD at age 1.  Father died of an MI at  26.  She has no siblings.   SOCIAL HISTORY:  She lives in [place] with her daughter.  She is  retired.  She has a 78-pack-year history of tobacco abuse, quitting in  1992.  She denies alcohol or drug use.  She does not routinely exercise.   REVIEW OF SYSTEMS:  Positive for chest pain, shortness of breath,  dyspnea on exertion.  She has had diarrhea for the past 3 weeks,  although this has been a bay for the past 48 hours.  She has a history  of  diabetes.  Otherwise, all systems reviewed negative.  She is a full  code.   PHYSICAL EXAMINATION:  VITAL SIGNS:  Temperature 98.3, heart rate  currently 95, was 114; respirations 18; blood pressure 112/60, pulse ox  99% on 2 L.  GENERAL:  Pleasant white female, in no acute distress.  Awake, alert,  and oriented x3.  HEENT:  Normal nares grossly intact, nonfocal.  PSYCHIATRIC:  Normal affect.  SKIN:  Warm and dry without lesions or masses.  MUSCULOSKELETAL:  Grossly normal without deformity or effusions.  NECK:  No bruits, JVD.  Supple.  LUNGS:  Respirations are regular and unlabored.  Clear to auscultation.  CARDIAC:  Regular S1 and S2.  No S3, S4, or murmurs.  ABDOMEN:  Round, soft, nontender, and nondistended.  Bowel sounds  present x4.  EXTREMITIES:  Warm, dry, and pink.  No clubbing, cyanosis, or edema.  Dorsalis pedis, posterior tibial pulses 1+ and equal bilaterally.  There  are no femoral bruits.   Chest x-ray shows no active disease.  EKG shows sinus tachycardia rate  of 104, left axis deviation.  No acute ST-T changes.  Hemoglobin 11.4,  hematocrit 34.2, WBC 9.8, and platelets 408.  Sodium 138, potassium 4.2,  chloride 105, CO2 of 26, BUN 17, creatinine 0.93, glucose 110.  CK-MB  less than 1.0, troponin I less than 0.05, calcium 9.3.   ASSESSMENT AND PLAN:  Unstable angina/coronary artery disease.  The  patient presents with about 10-hour history of substernal chest pressure  similar to previous angina.  Symptoms resolved with IV nitroglycerin.  Cardiac monitor was negative so far and ECG is not acute.  She is  currently pain free.  We plan to admit and cycle cardiac markers.  We  will add heparin and nitro paste and continue aspirin, Plavix, statin,  ACE inhibitor.  The patient reports intolerance to Coreg and Lopressor,  thus we will not be adding beta-blocker.   PLAN:  1. Right and left heart cardiac catheterization in the a.m.  2. Hypertension, stable.  3.  Hyperlipidemia, check with LFTs.  Continue statin and TriCor.  4. Diabetes mellitus, hold metformin add sliding scale insulin.  5. Diarrhea.  She has 3-week history of diarrhea which she attributes      to metoprolol therapy.  It appears to have resolved yesterday.  We      will also check stool for C. Diff, leukocytes and culture and add      p.r.n. Imodium.      [name], ANP      Rollene Rotunda, MD, [facility]  Electronically Signed    CB/MEDQ  D:  11/01/2008  T:  11/02/2008  Job:  [id number]

## 2010-12-06 NOTE — H&P (Signed)
NAMERONNETTE, RUMP NO.:  [id number]   MEDICAL RECORD NO.:  [id number]          PATIENT TYPE:  INP   LOCATION:  3705                         FACILITY:  [facility]   PHYSICIAN:  Luis Abed, MD, FACCDATE OF BIRTH:  October 16, 1933   DATE OF ADMISSION:  12/18/2006  DATE OF DISCHARGE:                              HISTORY & PHYSICAL   CHIEF COMPLAINT:  Chest pressure.   HISTORY OF PRESENT ILLNESS:  A 77 year old white female with known  coronary artery disease with status post CABG in 2001 in [place].  She  had a normal Cardiolite in September 2004 and had placement of 2 drug-  eluding stents, one to the LAD and one to the obtuse marginal saphenous  venous graft.  At that time, her EF was 60%.  Her history also includes  diabetes type 2, hypertension, and hyperlipidemia.  She came in today  because she started to have substernal chest pressure that started at  approximately 9 o'clock this morning which was persistent to now despite  lying down and taking a nap.  His pain did not lessen with sublingual  nitro x3, which the patient took while at home.  This chest pain was  similar to what she felt prior to her CABG in 2001.  She described  herself and the daughter agrees that she is a stoic person.  The chest  pressure decreased in intensity when she arrived to the ER prior to the  nitro drip.  Otherwise, she denies shortness of breath, diaphoresis, or  nausea and vomiting.  She denies any paroxysmal nocturnal dyspnea or  orthopnea, but does get short of breath after walking approximately 1  block.   ALLERGIES:  CODEINE AND MORPHINE, WHICH CAUSE A RASH AND ITCH.   MEDICATIONS:  1. Lipitor 20 mg daily.  2. Plavix 75 mg daily.  3. Elavil 50 mg nightly.  4. Tricor 145 mg daily.  5. Januvia 100 mg daily.  6. Ramipril 10 mg daily.  7. Aspirin 81 mg daily.  8. Isosorbide mononitrate 30 mg daily.  9. Metformin 1,000 mg b.i.d.   PAST MEDICAL HISTORY:  1. Coronary  artery disease.      a.     CABG 2001 with saphenous venous grafts to the RCA and first       obtuse marginal of the circumflex.      b.     Cath on May 2005 showed a 30% stenosis in the left main.       The LAD had multiple stents with a mid distal 40% end stent       stenosis and a 50% end stent stenosis in the distal LAD.  However,       there is still normal TIMI flow.  The first diagonal had a 40%       multiple discrete lesions.  The RCA was 100% occluded.  The       circumflex was 100% occluded.  The saphenous venous graft to the       obtuse marginal had a 40% end stent restenosis.  c.     The saphenous venous graft to the RCA was widely patent.       The LIMA was widely patent and was not used for the CABG.  2. Diabetes type 2 complicated with neuropathy.  3. Hypertension.  4. Hyperlipidemia.  5. Depression.  6. Chronic back pain.  7. Peptic ulcer disease.  8. Peripheral vascular disease with claudication.   SOCIAL HISTORY:  Positive tobacco with 80-year pack history, quit in  1992.  Negative alcohol.  Negative drugs.   FAMILY HISTORY:  Noncontributory.   REVIEW OF SYSTEMS:  As per HPI.  Negative abdominal pain.  Positive  right hip pain.  Negative headache.  Negative shortness of breath.   PHYSICAL EXAMINATION:  VITAL SIGNS:  Temperature 97.9, pulse 103,  respiratory rate of 18, blood pressure 126/57, and sat 97% on room air.  GENERAL:  In no acute distress.  Pleasant, talking in complete  sentences.  Appears comfortable.  NECK:  Negative JVD.  Negative bruits.  LUNGS:  Clear to auscultation bilaterally.  Negative wheeze and rhonchi.  CARDIAC:  Positive S1, S2.  Negative murmurs.  ABDOMEN:  Soft, nontender, nondistended.  Positive bowel sounds.  NEUROLOGICAL:  Nonfocal.   LABORATORY DATA:  Point of care:  Troponin less than 0.05, MB of less  than 1, myoglobin of 157, PT 12.7, PTT 28, INR 8.9.  ISTAT:  Sodium 135,  potassium 4.4, chloride of 107, bicarb of 25, BUN  of 26, creatinine of  1.8, and glucose of 96.   A chest x-ray demonstrated no acute disease.  EKG normal sinus rhythm at  a rate of 99 beats per minute.  Left axis deviation, which is old.  Non-  specific ST changes, no significant change prior to EKG dated February 21, 2006.  Various labs pending.   ASSESSMENT AND PLAN:  This is a 77 year old white female with extensive  coronary artery disease status post coronary artery bypass graft and  multi-stents with history concerning for new onset unstable angina.  1. Chest pressure:  We will admit to telemetry treatment including ACE      inhibitor, which she was already on, beta blocker which it is newly      started, aspirin, Plavix, heparin drip.  We will rule out      myocardial infarction by enzymes and serial EKGs.  If she does      develop a positive enzyme level we will then commence 2b/3a      inhibitor.  She is scheduled for a cath in the a.m. and she is      n.p.o. after midnight.  Precath orders are done.  2. Diabetes type 2:  We will hold her hyperglycemics and put her on an      insulin sliding scale.  She is normally on Lantus 10 units nightly;      however, we will cut this in half to 5 units nightly while she is      n.p.o. for the procedure.  After than we can recommence 10 units      nightly.  3. Hypertension:  Well-controlled.  Continue home medications.  4. Depression:  Continue Elavil.  5. Acute on chronic renal insufficiency:  Baseline is approximately      1.2 to 1.4, probably secondary to      dehydration.  We will re-hydrate and start bicarb drip per cath      protocol and recheck BMET in a.m.  6. Hyperlipidemia:  Continue Lipitor,  but we will increase the dose to      80 mg daily.  7. Peptic ulcer disease:  We will put her on Protonix 40 daily.      Hollace Hayward, M.D.  Electronically Signed      Luis Abed, MD, [facility] Electronically Signed    TE/MEDQ  D:  12/18/2006  T:  12/19/2006  Job:  [id number]

## 2010-12-09 NOTE — Assessment & Plan Note (Signed)
[facility]                              CARDIOLOGY OFFICE NOTE   NAME:Carla Friedman, Carla Friedman                     MRN:          [id number]  DATE:03/15/2006                            DOB:          10-29-1933    Carla Friedman returns today for followup.  She had an episode about 17 days ago  where she tripped and fell.  She had a fairly large ecchymosis on her head  and a bruise on her foot.  She is doing well.  We finally did manage to get  her off her Actos, and she has lost 20 pounds.  She feels wonderful.  I  think this has also contributed to her fall in blood pressure.  She has  slight renal insufficiency and is a diabetic.  I told her to stop her Lasix  that she had previously been on for edema.  She will continue her low-dose  ACE inhibitor.  She continues to have dizziness when turning her head to the  left.  She knows to try to avoid this, and I suspect she also has an inner  ear problem.  Her sugar has been under good control.  She is not postural.  She has significant coronary disease and needs a follow-up stress test.  She  does get occasional chest pain, and I refilled her nitroglycerin.  She has  had bypass surgery in [place] in 2001, and has had some occluded grafts and  residual stenting of her OM graft.   I do not think she has a LIMA.   PHYSICAL EXAMINATION:  GENERAL:  She looks markedly thinner, down 20 pounds.  VITAL SIGNS:  Blood pressure is 118/70, pulse is 70 and regular.  LUNGS:  Clear.  NECK:  Carotids are normal.  HEART:  There is an S1, S2.  Normal heart sounds.  ABDOMEN:  Benign.  EXTREMITIES:  Distal pulses are intact, with no edema.   IMPRESSION:  Stable coronary disease, status post coronary artery bypass  graft, with occluded grafts, and stenting of the obtuse marginal graft.   I believe she also has stents to the distal LAD.  She is on aspirin and  Plavix.  She gets occasional chest pain, but the pattern is stable and  somewhat atypical.  She will have a follow-up Myoview.  She will continue  her ACE inhibitors since she is a diabetic.  Her sugars are much easier to  control now that she is off the Actos and has lost weight.  Refill for  nitroglycerin was given.   Despite Carla Friedman's complicated situations, she seems to be doing well and  particularly is doing extremely better with the weight now that she is off  Actos.                              [name]. Eden Emms, MD, [facility]    PCN/MedQ  DD:  03/15/2006  DT:  03/15/2006  Job #:  [id number]

## 2010-12-09 NOTE — Cardiovascular Report (Signed)
[place]  Patient:    Carla Friedman, Carla Friedman Visit Number: [id number] MRN: [id number]          Service Type: CAT Location: [facility] 2899 [id number] Attending Physician:  Colon Branch Dictated by:   [name] Eden Emms, M.D. [facility] Admit Date:  06/26/2001                          Cardiac Catheterization  INDICATIONS:  Status post CABG August 2001 with vein grafts to the circumflex and right coronary artery, subsequent stent to the distal LAD in July with recurrent chest pain.  FINDINGS: 1. The left main coronary artery had a 30% discrete stenosis. 2. The left anterior descending artery had 40% multiple discrete lesions    proximal.  The mid to distal vessel had 80% diffuse disease including the    takeoff of the second diagonal branch.  The distal stent had 50% in-stent    restenosis. 3. The circumflex coronary artery was diffusely diseased and subtotally    occluded distally. 4. The right coronary artery was subtotally occluded in the mid vessel. 5. The saphenous vein graft to the right coronary artery was widely patent.    There was an 80% discrete lesion in the small and diffusely diseased PDA    plus.  The posterolateral branch was small and diffusely diseased.  The    vein graft to the OM had a 95% distal lesion.  It supplied a very small    obtuse marginal branch and I do not think distal protection would be    possible using Percusurge.  RAO ventriculography:  RAO ventriculography was normal with an EF of 60%. There was no gradient across the aortic valve and no MR.  IMPRESSION:  The films were reviewed with Dr. Juanda Chance and Dr. Gerri Spore.  Both lesions are difficult to intervene on, and we will try to intervene with percutaneous transluminal coronary angioplasty and stenting of the vein graft to the obtuse marginal branch.  Distal protection will not be possible.  The risk here would involve distal embolization.  The left anterior descending  artery is a very difficult area to work on.  The vessel is extremely small and the mid to distal portion involves the takeoff of the second diagonal branch.  I think our initial plan will be to intervene on the vein graft to the circumflex and then try to treat the patient medically.  We will see if there are any new devices such as small coated stents which are available to [place] in the future.  If she continues to have a lot of chest pain, we may need to intervene on the mid to distal left anterior descending artery with a cutting balloon. Dictated by:   [name] Eden Emms, M.D. [facility] Attending Physician:  Colon Branch DD:  06/26/01 TD:  06/26/01 Job: [id number]

## 2010-12-09 NOTE — Cardiovascular Report (Signed)
NAME:  Carla Friedman, Carla Friedman                        ACCOUNT NO.:  [id number]   MEDICAL RECORD NO.:  [id number]                   PATIENT TYPE:  INP   LOCATION:  4715                                 FACILITY:  [facility]   PHYSICIAN:  Charlton Haws, M.D.                  DATE OF BIRTH:  Aug 13, 1933   DATE OF PROCEDURE:  DATE OF DISCHARGE:                              CARDIAC CATHETERIZATION   PROCEDURE:  Coronary arteriography.   INDICATIONS:  Diabetic with previous bypass surgery and multiple stents to  the native LAD.   Cine catheterization was done from the right femoral artery.   1. Left main coronary artery had a 30% tubular stenosis.  2. The left anterior descending artery was widely patent.  The patient had     multiple stents previously placed to the mid- and distal vessel.  The     midvessel had a 40% in-stent restenosis.  The distal vessel had a 50% in-     stent restenosis, but there was normal TIMI flow and no critical     blockages.  The first diagonal branch had 40% multiple discrete lesions     proximally.  3. The native circumflex and right coronary artery were 100% occluded.  4. The saphenous vein graft to the obtuse marginal branch had 40% distal in-     stent restenosis.  5. The saphenous vein graft to the right coronary artery was widely patent.  6. The left internal mammary artery was widely patent but was not used for     any bypasses.   IMPRESSION:  The patient had no critical blockages.  She has small native  vessels; however, the stents in her vein graft to the obtuse marginal and  her native left anterior descending coronary artery do not have any critical  stenosis.  She will have continued medical therapy.  We will probably start  long-acting nitrate such as Imdur 30 mg a day and tight blood sugar control  will be warranted, and she will continue on her aspirin and Plavix.   A follow-up functional study in the office in three to four weeks will be in  order.   The patient tolerated the procedure well.                                               Charlton Haws, M.D.    PN/MEDQ  D:  12/07/2003  T:  12/07/2003  Job:  [id number]

## 2010-12-09 NOTE — Procedures (Signed)
[place]. [facility]  Patient:    Carla Friedman, Carla Friedman Visit Number: [id number] MRN: [id number]          Service Type: CAT Location: [facility] 17 Attending Physician:  Colon Branch Dictated by:   Daisey Must, M.D. [facility] Proc. Date: 06/26/01 Admit Date:  06/26/2001   CC:         [name], M.D. [facility]  Jeannett Senior A. Clent Ridges, M.D.  Cardiac Catheterization Lab   Procedure Report  PROCEDURE PERFORMED:  Percutaneous transluminal coronary angioplasty with stent placement in the distal portion of the saphenous vein graft and obtuse marginal branch.  INDICATIONS:  [name] is a 77 year old woman with a history of diabetes and previous coronary bypass surgery approximately one-and-a-half years ago. She was recently seen by Dr. Eden Emms in the office for chest pain occurring at rest.  A Cardiolite scan showed anteroapical ischemia.  Cardiac catheterization done by Dr. Eden Emms showed critical disease in the saphenous vein graft of an obtuse marginal.  There was a 95% stenosis in the distal portion of the saphenous vein graft prior to its anastomosis with the obtuse marginal.  There was also significant disease in the distal LAD which was not bypassed.  After review of the images, we opted to proceed with percutaneous intervention of the saphenous vein graft.  PROCEDURAL NOTE:  A preexisting 6-French sheath in the right femoral artery was exchanged over wire for a 7-French sheath.  Heparin and Integrilin were administered per protocol.  We used a 7-French LCD guiding catheter with sideholes and a BMW wire.  The lesion was predilated with a 3.0- x 20-mm Quantum Maverick balloon inflated to six atmospheres.  We then deployed a 3.0- x 24-mm Express 2 stent at a deployment pressure of 12 atmospheres. Intragraft Verapamil and nitroglycerin were administered pre and post balloon dilatation as well as post stent deployment.  Final angiographic images revealed  patency of the saphenous vein graft with 0% residual stenosis and TIMI-3 flow.  COMPLICATIONS:  None.  RESULTS:  Successful percutaneous transluminal coronary angioplasty with stent placement in the distal portion of the saphenous vein graft to the obtuse marginal.  A 95% stenosis was reduced to 0% residual with TIMI-3 flow.  PLAN:  Integrilin will be continued for 18 hours.  Plavix will be administered for approximately six months.  Aspirin will be continued indefinitely.  Will discuss further the optimal timing of intervention of the LAD with Dr. Eden Emms. Dictated by:   Daisey Must, M.D. [facility] Attending Physician:  Colon Branch DD:  06/26/01 TD:  06/26/01 Job: [id number]

## 2010-12-09 NOTE — Discharge Summary (Signed)
NAME:  Carla Friedman, Carla Friedman                        ACCOUNT NO.:  [id number]   MEDICAL RECORD NO.:  [id number]                   PATIENT TYPE:  INP   LOCATION:  6522                                 FACILITY:  [facility]   PHYSICIAN:  Charlton Haws, M.D.                  DATE OF BIRTH:  06-May-1934   DATE OF ADMISSION:  04/10/2003  DATE OF DISCHARGE:  04/15/2003                                 DISCHARGE SUMMARY   DISCHARGE DIAGNOSES:  1. Coronary artery disease.     A. Admitted with chest pain - Cardiolite abnormal.     B. Status post coronary artery bypass graft in 2001.     C. Status post drug-eluding stent to the mid left anterior descending and        drug-eluding stent to the saphenous vein graft to obtuse marginal on        April 14, 2003.     D. Ejection fraction 60%.  2. Mild normocytic anemia (hemoglobin 11.3, hematocrit 33.2).  3. Peripheral vascular disease.     A. Right common iliac artery stenosis of 50% noted during catheterization        this admission.  4. Treated hypertension.  5. Treated dyslipidemia.  6. Diabetes mellitus type 2.  7. Depression.   PROCEDURE:  Cardiac catheterization and percutaneous coronary intervention  by Carole Binning, M.D. [facility] on April 14, 2003.   HOSPITAL COURSE:  Please see the dictated admission history and physical by  Salvadore Farber, M.D. on April 10, 2003, for complete details.  Briefly, this 77 year old female presented on April 10, 2003, with  complaints of left shoulder pain and chest discomfort.  She was admitted and  placed on aspirin, Lovenox, and Plavix.  Her cardiac enzymes were negative  x3.  It was decided to further evaluate her chest pain with an Adenosine  Cardiolite. This was performed on April 12, 2003.  She had significant  inferolateral ST depression during Adenosine infusion. Her images revealed  anterior and apical ischemia. She was held in the hospital over the weekend  and set up for cardiac  catheterization.  She went for cardiac  catheterization on Tuesday, September 21, by Carole Binning, M.D. [facility].  Please see his dictated note for complete details.  She underwent drug-  eluding stent placement to the mid LAD with reduction in stenosis from 95 to  0%.  She also underwent drug-eluding stent placement to the SVG to OM with  reduction in stenosis of 80 to 0%. She tolerated her procedure well and had  no immediate complications.  Post procedure, her electrocardiogram was  without acute changes. On the morning of April 15, 2003, she was found  to be in stable condition.  Her post procedure CK-MB was 2.8.  It was felt  that she was stable enough for discharge home.  She will follow up in two  weeks with the  P.A.  She would need to follow up with Dr. Eden Emms in eight  weeks.  During her admission, it was noted that her hemoglobin and  hematocrit were both mildly decreased.  At discharge, her hemoglobin was  11.3, hematocrit 33.2.  She would need follow-up with her primary care  physician to further workup her anemia as an outpatient. Her potassium was  3.5 at discharge and she was given supplementation.  She was also set up  with cardiac rehab. According to Dr. Gerri Spore, she will need to remain on  Plavix for at least six months.  She may need lifelong Plavix.  Prior to  admission, she had been taking Plavix continuously for approximately two  years.  Essentially, she had no medication changes this admission.   LABORATORY DATA:  White count 5300, hemoglobin 11.3, hematocrit 33.2,  platelet count 295,000.  INR 1.0, sodium 141, potassium 3.5, chloride 106,  CO2 27, glucose 135, BUN 14, creatinine 1.1, calcium 9.1, total bilirubin  0.4, alkaline phosphatase 40, AST 22, ALT 16, total protein 6.5, albumin  3.8.  Cardiac enzymes as noted above.  Total cholesterol 184, triglycerides  243, HDL 48, LDL 87.  PSH 3.165.   Chest x-ray on September 20; probable mild COPD, no acute  disease, status  post CABG.   DISCHARGE MEDICATIONS:  1. Aspirin 325 mg daily.  2. Plavix 75 mg daily.  3. Lantus Insulin as before.  4. Toprol XL 200 mg daily.  5. Amitriptyline 50 mg q.h.s.  6. TriCor 160 mg daily.  7. Altace 10 mg twice daily.  8. Lipitor 25 mg daily.  9. Nitroglycerin p.r.n. chest pain.  10.      Zoloft.  11.      Actos.   PAIN MANAGEMENT:  1. Tylenol as needed.  2. Nitroglycerin as needed for chest pain.   She is to call our office or 911 for chest pain.   ACTIVITY:  No driving, heavy lifting, exertion, sex, or work for two weeks.   DIET:  Low salt, low fat, diabetic diet.   WOUND CARE:  The patient is to call our office in [facility] for any groin  swelling, bleeding, or bruising.   FOLLOW UP:  The patient is to follow up with Dr. Deniece Ree. on October 6  at 2 p.m.  She is to see Dr. Clent Ridges in the next one to two weeks and she should  call for an appointment. She should follow up on her anemia with Dr. Clent Ridges.  At the appointment with the P.A. on October 6, a follow-up with Dr. Eden Emms  should be set up for eight weeks.      Tereso Newcomer, P.A.                        Charlton Haws, M.D.    SW/MEDQ  D:  04/15/2003  T:  04/15/2003  Job:  [id number]   cc:   Jeannett Senior A. Clent Ridges, M.D. [facility]

## 2010-12-09 NOTE — Discharge Summary (Signed)
NAME:  Carla, Friedman                        ACCOUNT NO.:  [id number]   MEDICAL RECORD NO.:  [id number]                   PATIENT TYPE:  INP   LOCATION:  4715                                 FACILITY:  [facility]   PHYSICIAN:  Charlton Haws, M.D.                  DATE OF BIRTH:  11-02-1933   DATE OF ADMISSION:  12/04/2003  DATE OF DISCHARGE:  12/08/2003                           DISCHARGE SUMMARY - REFERRING   DISCHARGE DIAGNOSES:  1. Chest pain resolved.  2. Known coronary artery disease.  3. Diabetes mellitus, insulin dependent.  4. Hypertension, treated.  5. Hyperlipidemia, treated.  6. Peripheral vascular disease.  7. Depression.  8. Normocytic anemia.  9. Postmenopausal.  10.      Former tobacco abuse.  11.      Morphine causes a rash.  12.      Codeine causes nausea.   HOSPITAL COURSE:  Carla Friedman is a 77 year old female with known history of  coronary artery disease who underwent bypass surgery in 2001 in [place] with  saphenous vein graft to the right coronary artery as well as to the  circumflex.  She received a stent to the LAD in December of 2002 as well as  stent to the saphenous vein graft to the OM1.  On the day of admission, she  awoke with right-sided sharp chest pain.  She took five or six nitroglycerin  and this decreased the pain, however, she continued to have chest pain that  moved into her anterior chest and she describes this as heaviness.  She then  presented to [employer] Emergency Room.   Lab studies during the patient's hospital stay include a hemoglobin of 11.7,  hematocrit 34.5, platelets 378, white count 4.8, BUN 19, creatinine 1.2,  potassium 3.8.  Total cholesterol 161, triglycerides 196, LDL 81, HDL 41.  TSH 2.925.  Cardiac enzymes were negative.   Chest x-ray revealed no evidence of acute disease.   EKG shows normal sinus rhythm, rate 73 with no acute STT wave changes.   The patient ultimately underwent cardiac  catheterization on Dec 07, 2003,  and was found to have the following:  Left main 30% tubular stenosis, LAD  patent with the exception of 40% in-stent restenosis in mid vessel, the  distal vessel had a 50% in-stent restenosis but there was normal flow with  no critical blockage, D1 had a 40% lesion.  The native circumflex and right  coronary artery were occluded.  SVG to the OM was seen to have a 40% in-  stent restenosis.  SVG to the RCA was patent.  The LIMA was patent but was  not used in the bypass surgery.   The patient was kept overnight.  She was discharged to home in stable  condition on the following medications.   DISCHARGE MEDICATIONS:  1. Glucophage 500 mg p.o. b.i.d.  She is to start this on Dec 09, 2003.     Otherwise, she is to continue:  2. Plavix 75 mg a day.  3. Baby aspirin daily.  4. Toprol XL200 mg daily.  5. Altace 10 mg b.i.d.  6. Actos 45 mg a day.  7. Lipitor 20 mg q.h.s.  8. Evista 60 mg a day.  9. Elavil 60 mg q.h.s.  10.      Tricor 160 mg a day.  11.      Insulin as prior to admission.  12.      Sublingual nitroglycerin p.r.n. chest pain.   ACTIVITY:  No heavy lifting or strenuous exertion for one week.   DIET:  Remain on low fat, low salt, low cholesterol diabetic diet.   WOUND CARE:  Clean over catheterization site with soap and water, no  scrubbing.  She is to call for any unusual discharge, redness, or swelling.   FOLLOW UP:  She is to follow up with [facility] on December 29, 2003, at 10:30  a.m. and then with Dr. Eden Emms on February 09, 2004, at 11:15 a.m.      Carla Friedman, P.A. [facility]                      Charlton Haws, M.D.    LB/MEDQ  D:  12/08/2003  T:  12/08/2003  Job:  [id number]   cc:   Charlton Haws, M.D.   Tera Mater. Clent Ridges, M.D. [facility]

## 2010-12-09 NOTE — Cardiovascular Report (Signed)
NAME:  Carla Friedman, Carla Friedman                        ACCOUNT NO.:  [id number]   MEDICAL RECORD NO.:  [id number]                   PATIENT TYPE:  INP   LOCATION:  2013                                 FACILITY:  [facility]   PHYSICIAN:  Carole Binning, M.D. [facility]         DATE OF BIRTH:  08-Dec-1933   DATE OF PROCEDURE:  04/14/2003  DATE OF DISCHARGE:                              CARDIAC CATHETERIZATION   PROCEDURE PERFORMED:  1. Left heart catheterization with coronary angiography, bypass graft     angiography, and abdominal aortography.  2. PTCA with stent placement utilizing a drug eluting stent in the mid left     anterior descending artery.  3. PTCA and stent placement utilizing a drug eluting stent in saphenous vein     graft to first obtuse marginal branch.   INDICATIONS:  [name] is a 77 year old woman with diabetes and previous  bypass surgery with previous percutaneous coronary interventions.  She  presented to the hospital with chest pain.  A stress Cardiolite scan showed  significant anteroapical ischemia.  She was therefore referred for cardiac  catheterization.   PROCEDURAL NOTE:  A 6-French sheath was placed in the right femoral artery.  Coronary angiography was performed with a 6-French JL4 and JR4 catheters.  Saphenous vein grafts were imaged with a JR4 catheter.  Left  ventriculography and abdominal aortography were performed with an angled  pigtail catheter.  Contrast was Omnipaque.  There were no complications.   RESULTS:  HEMODYNAMICS:  Left ventricular pressure 150/20.  Aortic pressure 160/74.  There is no  aortic valve gradient.   LEFT VENTRICULOGRAM:  Wall motion is normal.  Ejection fraction estimated at greater than or equal  to 60%.  There is mitral regurgitation secondary to ventricular ectopy.   ABDOMINAL AORTOGRAM:  Patent renal arteries.  There is moderate diffuse atherosclerotic disease of  the distal abdominal aorta.  There is a 50% stenosis of the  right common  iliac artery.   CORONARY ARTERIOGRAPHY:  (Right dominant)  Left main is normal.   Left anterior descending artery has three stents in the mid and distal  vessel.  There is a slight gap in the LAD between the first and second stent  from which arises a small diagonal branch.  Within that gap between the  stents there is a 95% stenosis.  The second stent is a covered stent which  was placed for coronary perforation.  There is a 70% stenosis within that  stent.  The third stent is patent.  Proximal to the first stent and between  the second and third stents there is approximately 40% stenoses in the LAD.  The LAD gives rise to a large first diagonal branch which has a 20% stenosis  at its origin.  There is a small second diagonal branch and a small third  diagonal branch which arises from the diseased portion of the LAD.  In the  proximal portion  of that third diagonal branch there is an 80-90% stenosis.   Left circumflex has a 70% stenosis in the proximal vessel.  The circumflex  gives rise to a normal sized branching OM 1 which has a 90% stenosis  proximally.  This OM 1 fills via saphenous vein graft.   Right coronary artery is a dominant vessel.  The right coronary artery is  100% occluded proximally.   Saphenous vein graft to the first obtuse marginal has a stent in the distal  portion of the vein graft.  There is an 80% stenosis within this stent.  This graft is otherwise patent filling a small caliber, but fairly long  bifurcating first obtuse marginal branch.   Saphenous vein graft to the distal right coronary artery has 30% stenosis  proximally and 30% stenosis distally, but is otherwise patent.  This fills a  relatively large distal right coronary artery territory consisting of a  normal sized posterior descending artery and three normal sized  posterolateral branches.   IMPRESSION:  1. Normal left ventricular systolic function.  2. Native three vessel  coronary artery disease.  3. Status post coronary artery bypass surgery x2.  There is a patent graft     to the right coronary artery.  There is restenosis within a previously     placed stent to the obtuse marginal.  In addition, there is significant     restenosis within the mid left anterior descending as described.   PLAN:  Percutaneous intervention of the LAD and the saphenous vein graft.  See below.   PTCA PROCEDURAL NOTE:  The findings of the catheterization were discussed at  length with the patient and her family.  After discussion of alternative  therapies, the family and patient opted to proceed with percutaneous  coronary intervention.  We utilized a preexisting 6-French sheath in the  right femoral artery.  Heparin and Integrilin were administered per  protocol.  We initially treated the LAD.  We utilized a 6-French CLS3.5  guide and catheter.  A BMW wire was advanced into the distal LAD.  We then  performed PTCA of the diseased segment of mid LAD with a 2.25 x 20 mm  Quantum balloon inflated to 12 and then 10 atmospheres.  We then carefully  positioned a 2.5 x 20 mm Taxus drug eluting stent such that it covered the  first and second stent in the LAD as well as the area of disease between  these two stents.  The stent was deployed at 9 atmospheres.  We then post  dilated the stent with a 2.5 x 15 mm Quantum balloon inflated to 12  atmospheres in the distal aspect of the stent, 16 atmospheres in the mid  aspect of the stent, and 12 atmospheres in the proximal aspect of the stent.  Intracoronary nitroglycerin was administered to relieve vasospasm.  Angiographic images were then obtained revealing patency of the LAD with 0%  residual stenosis at the stent site and TIMI 3 flow.  The stent did occlude  the small third diagonal branch.  However, this branch filled very well via  collaterals.  We then turned our attention to the saphenous vein graft.  We used a 6-  [place] LCB  guiding catheter.  A BMW wire was advanced beyond the area of  stenosis in the vein graft and positioned in the distal obtuse marginal.  We  then deployed a 3.0 x 24 mm Taxus drug eluting stent within the previously  placed stent  at a deployment pressure of 13 atmospheres.  Angiographic  images were then obtained revealing patency of the saphenous vein graft with  0% residual stenosis at the stent site and TIMI 3 flow.   COMPLICATIONS:  None.   RESULTS:  1. Successful PTCA with stent placement utilizing a drug eluting stent in     the mid left anterior descending artery.  A 95% stenosis followed by a     70% in-stent restenosis were both reduced to 0% residual with TIMI 3     flow.  2. Successful placement of a drug eluting stent in the saphenous vein graft     to the obtuse marginal.  An 80% in-stent restenosis was reduced to 0%     residual with TIMI 3 flow.    PLAN:  Integrilin will be continued for 18 hours.  Plavix will be continued  for an additional six months and would consider lifelong aspirin and Plavix  therapy due to the patient's advanced coronary artery disease.                                               Carole Binning, M.D. [facility]    MWP/MEDQ  D:  04/14/2003  T:  04/14/2003  Job:  [id number]   cc:   Jeannett Senior A. Clent Ridges, M.D. [facility]   Cath Lab

## 2010-12-09 NOTE — Cardiovascular Report (Signed)
[place]  Patient:    Carla Friedman, Carla Friedman Visit Number: [id number] MRN: [id number]          Service Type: MED Location: CCUA [id number] Attending Physician:  Nelta Numbers Dictated by:   Veneda Melter, M.D. [facility] Proc. Date: 07/05/01 Admit Date:  07/04/2001   CC:         [name], M.D. [facility]  Jeannett Senior A. Clent Ridges, M.D.   Cardiac Catheterization  PROCEDURES PERFORMED: 1. Left heart catheterization. 2. Selective coronary angiography. 3. Placement of a pulmonary artery catheter. 4. Percutaneous transluminal coronary angioplasty and stenting of the mid left    anterior descending artery. 5. Percutaneous transluminal coronary angioplasty of the distal left anterior    descending artery for in-stent restenosis. 6. Percutaneous transluminal coronary angioplasty and stent placement in the    distal left anterior descending artery for treatment of coronary    perforation.  DIAGNOSES: 1. Coronary artery disease. 2. Unstable angina. 3. Iatrogenic coronary perforation.  HISTORY:  Carla Friedman is a 77 year old white female with a history of coronary artery bypass graft surgery in August 2001 with placement of a saphenous vein graft to the distal RCA as well as a saphenous vein graft to the left circumflex artery.  The patient has had recurrence of chest discomfort and recently underwent cardiac catheterization on June 26, 2001, showing severe disease in the saphenous vein graft to the left circumflex artery.  This was intervened upon with placement of a stent.  The patient has had a stress imaging study showing ischemia in the anteroapical wall and she does have severe disease in the mid and distal LAD encompassing a third diagonal branch.  Due to the small caliber of the vessel as well as the possible compromise of the third diagonal branch, medical treatment was pursued.  Unfortunately, the patient has had recurrence of discomfort and  she is referred for repeat angiography and possible intervention to the distal LAD.  DESCRIPTION OF PROCEDURE:  Informed consent was obtained.  The patient was brought to the catheterization lab.  A 6-French sheath was placed in the right femoral artery.  Left heart catheterization and then selective angiography were then performed in the usual fashion using preformed 6-French Judkins catheters.  The JR4 catheter was used to obtain left heart pressures.  Left ventriculogram was not performed.  This was performed on June 26, 2001, and was seen to be normal.  Findings are as follows:  FINDINGS: 1. Left main trunk:  Medium caliber vessel with mild irregularities. 2. LAD:  This is a small caliber vessel that provides three diagonal branches.    The LAD has moderate diffuse disease along its entire course with    narrowings of 60% after the first diagonal branch.  There is then a hazy    narrowing of 80% encompassing the third diagonal branch.  There is then    further narrowing of 70% in the distal segment prior to and extending into    the previously placed stent in the distal LAD of 70%.  The LAD is a small    caliber vessel of 2 mm in its apical segment and is perhaps 2.5 mm at the    third diagonal branch. 3. Left circumflex artery:  The AV circumflex has severe disease of 70-80% in    the proximal segment and is then 100% occluded.  The first and second    marginal branch are seen to fill via saphenous vein graft to  the mid    section.  These vessels are of small caliber and have mild irregularities. 4. Right coronary artery:  Dominant.  This is a medium caliber vessel that    provides a posterior descending artery and two posterior ventricular    branches in its terminal segment.  The right coronary artery has severe    narrowing of 70% in the proximal segment, then there is a long tubular    narrowing of at least 80-90% in the mid section.  The distal vessel is seen    to fill  via saphenous vein graft.  The posterior descending artery is a    small vessel with a proximal narrowing of 70%.  The two posterior    ventricular have mild irregularities. 5. Saphenous vein graft to the first marginal branch of the left circumflex    artery with retrograde filling of the second marginal branch:  This vessel    is patent with mild irregularities.  The stent is seen in the distal    segment of the vessel that is widely patent. 6. Saphenous vein graft to the distal RCA:  Patent.  There is mild taper of    the proximal segment preceding a valve. 7. LV:  Left ventriculogram not performed.  Left ventricular pressures are as    follows:  LV equals 168/10.  Aortic was 168/75.  LVEDP equals 20.  These findings were reviewed with the patient and we elected to proceed with percutaneous intervention to the distal LAD.  The 6-French sheath in the right groin was exchanged over wire for a 7-French sheath.  A 5-French sheath was placed in the right femoral vein for IV access.  The patient was given 3000 units of heparin and then Integrilin on a weight-adjusted basis to maintain an ACT of approximately 225 seconds.  A long 0.014-inch Extra Sport wire was then advanced into the third diagonal branch for protection and a short Extra Sport wire advanced into the distal LAD.  A 2.0- x 10-mm cutting balloon was introduced.  Two inflations were performed in the distal LAD within the area of stenting at six atmospheres for 30 seconds.  Two inflations were then performed at the site of the third diagonal branch at 10 atmospheres for 30 seconds.  Repeat angiography showed only mild improvement in the vessel lumen at the site of the third diagonal branch.  There was still residual waste in the distal LAD above the area of previous stent placement.  A 2.5- x 10-mm cutting balloon was then introduced and two inflations performed in the distal LAD encompassing the third diagonal branch at 10  atmospheres for 30 seconds  and an inflation just proximal to the old stent at 10 atmospheres for 30 seconds.  Repeat angiography showed improvement in vessel lumen; however, there was evidence of a dissection cap prior to the third diagonal branch as well as extravasation of contrast between the third diagonal branch and the stent consistent with arterial perforation.  A 2.5- x 20-mm Esprit perfusion balloon was then introduced and inflated across the perforation at four atmospheres for eight minutes and 30 seconds.  Repeat angiography showed continued mild extravasation of contrast.  A 2.75- x 12-mm Jomed stent was then hand-crimped on a 2.5- x 15-mm Maverick balloon and carefully advanced to the site of perforation.  This was deployed in the distal segment at eight atmospheres for 60 seconds and in the proximal segment at 12 atmospheres for 60 seconds.  During deployment,  there was slight migration of the stent proximal to the balloon and the site of perforation was thus at the very distal segment of the stent.  Initially there appeared to be complete closure of the perforation.  A repeat angiography then showed a very small amount of extravasation despite placement of the covered stent.  A 2.5- x 8-mm Express 2 stent was then introduced and deployed proximal to the third diagonal branch for stabilization of dissection cap at 12 atmospheres for 30 seconds.  The stent delivery balloon was advanced slightly and used to further occlude the site of perforation at four atmospheres for 15.5 minutes.  Repeat angiography showed complete closure of the perforation.  There was evidence of mild vessel compromise proximal to the site of balloon dilatation and a 2.5- x 15-mm Maverick balloon was used to dilate this segmenta at six atmospheres for 30 seconds with repeat angiography showing complete resolution of this area of disruption.  This was not clear whether this represented thrombus  although this is most likely what it was.  During the case, the Integrilin was discontinued initially since he was 246 seconds and at the termination of the case was 190 seconds.  There was no evidence of compromise of the third diagonal branch.  This vessel did have a severe ostial narrowing of 80%. There was TIMI grade 3 flow through the LAD.  At the termination of the case and no extravasation of contrast, the patient was transferred to the holding area where an echocardiogram was performed.  This showed no pericardial effusion although there was akinesis of the apex consistent with severe stunning.  She was then transferred to the CCU in stable condition.  This was a protracted and difficult case due to time involvement and treatment of the perforation.  FINAL RESULT: 1. Successful percutaneous transluminal coronary angioplasty of the distal    left anterior descending artery for treatment of in-stent restenosis with    reduction of 70% narrowing to 30%. 2. Successful percutaneous transluminal coronary angioplasty and stenting of    the mid left anterior descending artery for treatment of 70% narrowing    encompassing the third diagonal branch to 0% with placement of a 2.5- x    8-mm Express 2 stent just proximal to the diagonal branch. 3. Successful treatment of coronary perforation with placement of a 2.75- x    12-mm Jomed stent in the distal left anterior descending artery.  ASSESSMENT AND PLAN:  Carla Friedman will be transferred to the intensive care unit.  A PA catheter was placed for hemodynamic monitoring at the termination of the case.  An echocardiogram shows no effusion.  Repeat echocardiogram will be performed and careful hemodynamic monitoring as well.  Should the patient show any evidence of hemodynamic compromise, consideration should be given toward surgical intervention and surgical consultation will be obtained. Anticoagulation has been discontinued and she will be  treated with Plavix only.Dictated by:   Veneda Melter, M.D. [facility] Attending Physician:  Nelta Numbers DD:  07/05/01 TD:  07/05/01 Job: [id number]

## 2010-12-09 NOTE — H&P (Signed)
NAME:  Carla Friedman, Carla Friedman                        ACCOUNT NO.:  [id number]   MEDICAL RECORD NO.:  [id number]                   PATIENT TYPE:  INP   LOCATION:  2013                                 FACILITY:  [facility]   PHYSICIAN:  Salvadore Farber, M.D.             DATE OF BIRTH:  1933-12-09   DATE OF ADMISSION:  04/10/2003  DATE OF DISCHARGE:                                HISTORY & PHYSICAL   CHIEF COMPLAINT:  Chest and left shoulder discomfort.   HISTORY OF PRESENT ILLNESS:  Carla Friedman is a 77 year old lady with  coronary disease status post coronary artery bypass grafting performed in  [place], [place] in 2001 (saphenous vein graft to RCA, saphenous vein  graft to circumflex, LAD was not grafted).  In December 2002, she underwent  complex PCI of the LAD by Dr. Chales Abrahams which was complicated by coronary  perforation treated with a coated stent.  Immediately after that  hospitalization, her son committed suicide and shortly thereafter she  divorced her husband.  Life has been difficult for her since.   Yesterday, while carrying unusually heavy groceries, she developed left  shoulder pain.  Today, she awoke with left chest and shoulder discomfort  which lasted most of the morning.  She eventually presented to the emergency  room pain free.  She has had no recurrence since then.   PAST MEDICAL HISTORY:  1. Coronary artery disease as above.  2. Diabetes mellitus.  3. Hypertension.  4. Dyslipidemia.  5. Status post tonsillectomy.  6. Status post appendectomy.  7. Chronic back pain.  8. Depression.   ALLERGIES:  1. MORPHINE causes rash.  2. CODEINE causes nausea.  3. Allergic to antibiotic which she cannot remember.   MEDICATIONS:  1. Plavix 75 mg daily.  2. Insulin.  3. Toprol XL 200 mg q.a.m.  4. Tricor 160 mg daily.  5. Lipitor 20 mg daily.  6. Altace 10 mg b.i.d.   SOCIAL HISTORY:  The patient currently living with her daughter.  She is  divorced.  Quit tobacco in  1992.  Denies alcohol use.   FAMILY HISTORY:  Mother died at 22.  Father died at 61 of myocardial  infarction.  She has no siblings.   REVIEW OF SYSTEMS:  Negative in detail except as above.   PHYSICAL EXAMINATION:  GENERAL:  Well-appearing obese woman in no distress.  VITAL SIGNS:  Heart rate 98, blood pressure 119/54 and oxygen saturation of  100% on two liters.  NECK:  She has no jugular venous distention.  LUNGS:  Clear to auscultation.  HEART:  She has regular rate and rhythm with no murmur, S3, or S4.  Chest  pain is readily reproduced with palpation over the left pectoralis major.  ABDOMEN:  Soft, nondistended, nontender.  There is no hepatosplenomegaly.  Bowel sounds are normal.  EXTREMITIES:  Warm without clubbing, cyanosis, edema, ulceration.   Electrocardiogram demonstrates normal sinus rhythm with  minor nonspecific  STT abnormalities without change compared to previous EKG.  Initial cardiac  enzymes are negative.   IMPRESSION/RECOMMENDATIONS:  The patient with known severe coronary disease  presents with chest discomfort.  Based on history and reproducibility of  pain with palpitation, I think it is most likely musculoskeletal in origin.  However, given the severity of her known coronary disease, certainly cannot  exclude acute coronary syndrome.  Will therefore admit her to rule out  myocardial infarction.  Treat with aspirin, Plavix, low-molecular weight  heparin.  If enzymes are normal, proceed to Cardiolite in the morning.                                                Salvadore Farber, M.D.    WED/MEDQ  D:  04/10/2003  T:  04/12/2003  Job:  [id number]   cc:   Jeannett Senior A. Clent Ridges, M.D. [facility]

## 2010-12-09 NOTE — Discharge Summary (Signed)
[place]. [facility]  Patient:    Carla Friedman, Carla Friedman Visit Number: [id number] MRN: [id number]          Service Type: MED Location: [phone] Attending Physician:  Nelta Numbers Dictated by:   Brita Romp, P.A. Admit Date:  07/04/2001 Discharge Date: 07/09/2001   CC:         Jeannett Senior A. Clent Ridges, M.D.  [name]. Eden Emms, M.D. [facility]   Discharge Summary  DISCHARGE DIAGNOSES:  1. Chest pain, status post cardiac catheterization for intervention.  2. Hyperlipidemia.  3. Diabetes mellitus.  4. Hypertension.  5. Urinary tract infection.  HOSPITAL COURSE:  The patient is a 77 year old female who had undergone catheterization with intervention on 06/26/01.  She presented with recurrent substernal chest pain and heaviness which radiated into the neck.  She was seen and admitted by Dr. Blairsden Bing who felt her symptoms were consistent with ischemia.  Dr. Dietrich Pates started the patient on Lovenox and scheduled her for cardiac catheterization.  On 07/05/01 the patient was taken to the catheterization laboratory by Dr. Nedra Hai.  CATHETERIZATION RESULTS:  1. Left main: Medium vessel with mild irregularities.  2. Left anterior descending:  Moderate diffuse disease along the entire     course, narrowing to 60% after the first diagonal, followed by a hazy 80%     encompassing the third diagonal.  Subsequently there is further narrowing     of 70% in the distal segment prior to and extending into the previously     placed stent in the distal left anterior descending of 70%. The left     anterior descending is noted to be a small caliber vessel of 2 mm apically     and potentially 2.5 mm at the third diagonal.  3. Left circumflex:  Severe disease of 70 to 80% proximal segment, 100%     occluded.  First and second marginals are filled via saphenous vein graft     to the mid sections.  4. Right coronary artery:  Dominant vessel, severe narrowing of 70%  proximally followed by a long tubular 80 to 90% lesion in the mid section.   Distal vessel is filled via saphenous vein graft.  PDA is a small vessel  with proximal 70% narrowing.  5. Saphenous vein graft to first marginal of the left circumflex,     with mild irregularities.  Stent in distal vessel is widely patent.  6. Saphenous vein graft to the distal right coronary artery, patent.  Dr. Chales Abrahams then elected to proceed to intervene on the lesion in the distal LAD.  After several attempts at angioplasty he then inserted a cutting balloon.  Repeat angiography showed improvement in the vessel lumen, however, there was evidence of a dissection clear to auscultation prior to the third diagonal branch as well as extravasation of contrast between the third diagonal and the stent which is consistent with arterial perforation.  He then introduced perfusion balloon across the perforation and then deployed two stents to cover the perforation.  There was resulting TIMI grade 3 flow throughout the LAD.  At the termination of the case, the patient was transferred to the holding area where an echocardiogram was performed.  This showed no pericardial effusion, however, there was akinesis of the apex consistent with severe stunting.  Dr. Chales Abrahams then requested a consultation from [facility]. The patient was seen by Dr. Kathlee Nations Trigt.  He felt there was no indication for bypass surgery on the left anterior  descending.  He also felt there was no need for pericardial drainage at the current time.  Over the next few days the patient continued to do well and denied any further chest pain.  On 07/09/01 the patient was seen by Dr. Charlton Haws.  He felt she was doing well and ready to be discharged.  DISCHARGE MEDICATIONS:  1. Enteric coated aspirin 325 mg q.d.  2. Plavix 75 mg qd  3. Lipitor 20 mg q.h.s.  4. Evista 60 mg q.d.  5. Altace 10 mg b.i.d.  6. Elavil 50 mg q.h.s.  7. Lantus insulin 5 units  q.h.s.  8. Actos 45 mg q.d.  9. Toprol XL 200 mg q.d. 10. Ciprofloxacin 500 mg b.i.d. for three days.  LABORATORY DATA:  Sodium 141, potassium 4.2, chloride 106, cO2 24, BUN 23, creatinine 1.0, glucose 133, total protein 6.8, albumin 3.8, AST 22, ALT 21, alkaline phosphatase 57, total bilirubin 0.5.  CBC:  White blood cell count 8.1, hemoglobin 11.8, hematocrit 34.7, platelet count 351K.  URINALYSIS:  On 07/05/01 the urinalysis showed greater than 100,000 colonies per ml of Klebsiella pneumoniae.  This was noted to be sensitive to fluoroquinolones.  CHEST X-RAY: On admission the chest x-ray showed prior bypass surgery as well as coronary artery stenting.  No acute pulmonary process.  ELECTROCARDIOGRAM:  The electrocardiogram showed normal sinus rhythm at 87. There were some nonspecific ST-T wave changes.  PR interval 154, QRS 82, qTC 490, axis minus 25.  DISCHARGE INSTRUCTIONS:  ACTIVITY:  Patient is to slowly return to her normal level of activity.  FOLLOW UP:  Patient is to follow up as arranged with cardiac rehabilitation.  DIET:  Patient is to follow a low fat, diabetic diet.  WOUND CARE:  Patient is to watch the catheterization site for any pain, bleeding or swelling and to call the [place] office if any problems.  The patient is to have a urinalysis done on 07/19/01 as a test of cure with results going to Dr. Clent Ridges and she is to follow up with Dr. Clent Ridges as needed or as scheduled.  Patient is to follow up with Dr. Eden Emms in January as previously scheduled. Dictated by:   Brita Romp, P.A. Attending Physician:  Nelta Numbers DD:  07/09/01 TD:  07/09/01 Job: [id number]

## 2010-12-09 NOTE — H&P (Signed)
NAME:  Carla Friedman, Carla Friedman                        ACCOUNT NO.:  [id number]   MEDICAL RECORD NO.:  [id number]                   PATIENT TYPE:  INP   LOCATION:  4715                                 FACILITY:  [facility]   PHYSICIAN:  Thomas C. Wall, M.D.                DATE OF BIRTH:  08-Apr-1934   DATE OF ADMISSION:  12/04/2003  DATE OF DISCHARGE:                                HISTORY & PHYSICAL   PRIMARY PHYSICIAN:  Jeannett Senior A. Clent Ridges, M.D.   PRIMARY CARDIOLOGIST:  Charlton Haws, M.D.   HISTORY OF PRESENT ILLNESS:  Carla Friedman is a 77 year old white female who  was transferred via EMS to the [facility] Emergency Room secondary to chest  discomfort, thus her admission.  Carla Friedman states that yesterday morning  she woke up around 9 a.m. and she did not feel right.  She noted a right-  sided sharp discomfort, as well as a general anterior heaviness in her  chest.  These discomforts did not radiate nor were they associated with  shortness of breath, nausea, vomiting, or diarrhea.  She gave it a 6 on a  scale of 0-10 and the discomfort lasted all day.  Throughout the day she  took five or six nitroglycerin and it did reduce the discomfort down to  about 3, but it was never a 0.  By the time she went to sleep, the  discomfort was down to about a 1 and she was able to sleep last night.  However, when she woke up this morning, her symptoms were still present,  although the discomfort was at a 3-4.  Again she took two sublingual  nitroglycerin without any change.  She also noted a generalized weakness.  She called the [facility] Cardiology office and spoke to [facility], who referred her  to 911 and the [facility] Emergency Room for evaluation.  Carla Friedman  states that she has been doing well and has not had to use nitroglycerin in  the last several months.  She feels that the heaviness does resemble her  symptoms prior to her bypass surgery, but she denies any associated symptoms  with stent insertion.  She  has chronic dyspnea on exertion which has not  changed.   ALLERGIES:  Rash secondary to MORPHINE.  Nausea with CODEINE.  She thinks  she is allergic to an antibiotic, but is not sure what type.   MEDICATIONS:  Her preadmission medications include:  1. Lantus insulin 5 units q.p.m.  2. Toprol XL 200 mg daily.  3. Altace 10 mg b.i.d.  4. Glucophage 500 mg b.i.d.  5. Plavix 75 mg daily.  6. Actos 45 mg daily.  7. Lipitor 20 mg q.h.s.  8. Evista 60 mg daily.  9. Elavil 50 mg q.h.s.  10.      Tricor 160 mg daily.  11.      Multivitamins daily.  12.  Vitamin supplements of A, D, E, B12, and B6.  13.      Zinc.  14.      Bee pollen.  15.      Folic acid.  16.      Echinacea.  17.      Oyster shell calcium.  18.      Aspirin 81 mg p.o. daily.  19.      Tylenol Sinus.  20.      Nitroglycerin 0.4 mg p.r.n.   PAST MEDICAL HISTORY:  Notable for:  1. Insulin-dependent diabetes mellitus.  Home glucoses range from the 120s-     140s.  2. Associated peripheral neuropathy.  3. History of hypertension.  4. Hyperlipidemia.  5. Chronic back pain.  6. Depression.  7. Peripheral vascular disease with claudication symptoms.  ABIs in December     of 2004 showed a right ABI of 1.1 and a left of 0.092.  CT angiogram in     January of 2005 showed diffuse atherosclerosis felt to be severe in the     left distal SFA.  Her left PTA was totaled and the distal left ATA was     also significantly decreased, as well as a total perineal artery.     Medical treatment was recommended.  8. History of peptic ulcer disease.   PAST SURGICAL HISTORY:  She underwent two-vessel bypass grafting in 2001 in  [place] with saphenous vein graft to the RCA and saphenous vein graft to the  circumflex.  Catheterization associated with that surgery is not available.  It is noted that in December of 2002 she underwent stenting x 3 to the LAD  and a stent to the saphenous vein graft to the OM1.  An adenosine  Cardiolite  in September of 2004 showed an EF of 78% with anterior ischemia, which led  to a catheterization in September of 2004.  Again, she needed two-vessel  coronary artery disease that led to the bypass surgery.  The EF was 60%.  However, there was noted to be a 95% lesion between the LAD stent and a 70%  lesion in the second LAD stent.  She also had an 80% lesion in the saphenous  vein graft to the OM at the prior stent site.  She underwent drug-eluding  stent insertion to the LAD and to the saphenous vein graft OM for a total of  six stents in her lifetime.  More recently, an adenosine Cardiolite was  performed on September 11, 2003, and showed an EF of 76% and no ischemia.   SOCIAL HISTORY:  She resides in [place], [place] with her daughter  and is currently divorced.  She had a son who committed suicide at the age  of 79 and her other son and two daughters are alive.  She quit smoking in  1992, prior to that approximately two packs per day for 40 years.  She  denies any alcohol or drugs.  She has not exercised in the past month  secondary to chronic back pain.  Prior to that, she was exercising at  [facility] without difficulty.  She states that her diet is not as good as it  should be.   FAMILY HISTORY:  Her mother died at the age of 63 with COPD and her father  at 44 with a myocardial infarction.  She did not have any brothers or  sisters.   REVIEW OF SYSTEMS:  Notable as mentioned in HPI, as well as a weight  gain of  25 pounds over the last year, sinus headaches, glasses, missing two teeth,  pedal edema which reduces at night, claudication symptoms, nocturia,  postmenopausal, back and right hip arthralgias, GERD symptoms, hair loss,  and brittle nails.   PHYSICAL EXAMINATION:  GENERAL APPEARANCE:  A well-nourished, well-  developed, pleasant, white female in no apparent distress.  Her daughter is  present. VITAL SIGNS:  Temperature 98.2 degrees, blood pressure  115/72, pulse 70,  respirations 23, saturations 100% on 2 L.  HEENT:  Unremarkable.  NECK:  Supple without thyromegaly, adenopathy, JVD, or carotid bruits.  CHEST:  Symmetrical excursion.  The lungs were diminished, but clear to  auscultation.  HEART:  Regular rate and rhythm.  Heart sounds were distant without murmurs,  rubs, clicks, or gallops.  All peripheral pulses were symmetrical and equal.  SKIN:  Does not reveal any rashes.  ABDOMEN:  Obese.  Bowel sounds present without organomegaly, masses, or  tenderness.  EXTREMITIES:  No cyanosis, clubbing, or edema.  She does have lower  extremity varicosities.  MUSCULOSKELETAL:  Unremarkable.  NEUROLOGIC:  Intact.   LABORATORIES:  In the ER so far her chest x-ray shows cardiomegaly and no  active disease.  EKG with normal sinus rhythm, left axis deviation, normal  intervals, and no ischemic changes.  There were no change from EKG in  January of 2004.  The hemoglobin is 11.2, hematocrit 33.0, normal indices,  platelets 350, and WBC 6.2.  PTT 25, PT 12.4.   IMPRESSION:  1. Prolonged chest discomfort.  The initial EKG is negative for myocardial     infarction, but concerning for recurrent angina.  2. Normocytic anemia.  3. Coronary artery disease with a total of six stents in the last four     years.  Please refer to the past medical history for details.  4. Insulin-dependent diabetes with peripheral neuropathy.  5. Hypertension.  6. Hyperlipidemia.  7. Obesity.  8. Chronic back pain.  9. Depression.  10.      Peripheral vascular disease.  11.      Remote peptic ulcer disease.  12.      Remote tobacco use.   DISPOSITION:  Ms. Garrow will be admitted to [facility].  We will  place her on IV heparin and rule out myocardial infarction.  With her recent  negative Cardiolite and recurrent symptoms, we will schedule her for a  cardiac catheterization on Monday.  With her problems of catheterization via  the right groin,  anticipate this approach will be done through the left  femoral artery.  We will continue her home medications, however, we will  hold the Glucophage on Sunday prior to her catheterization on Monday.  Dr.  Daleen Squibb reviewed the patient's history, spoke with and examined the patient,  and agreed with above.      Joellyn Rued, P.A. [facility]                    Thomas C. Wall, M.D.    EW/MEDQ  D:  12/04/2003  T:  12/04/2003  Job:  [id number]   cc:   Jeannett Senior A. Clent Ridges, M.D. [facility]   Charlton Haws, M.D.

## 2010-12-09 NOTE — Discharge Summary (Signed)
[place]  Patient:    Carla Friedman, Carla Friedman Visit Number: [id number] MRN: [id number]          Service Type: CAT Location: [id number] Attending Physician:  Colon Branch Dictated by:   Guy Franco, P.A. [facility] Admit Date:  06/26/2001 Disc. Date: 06/27/01                    Referring Physician Discharge [place]  DISCHARGE DIAGNOSES: 1. Coronary artery disease. 2. Substernal chest pain, resolved. 3. Diabetes mellitus, insulin dependent. 4. Hypertension. 5. Diabetic neuropathy.  HOSPITAL COURSE:  [name] was admitted on June 26, 2001 to undergo cardiac catheterization.  She had been seen in the office in November with recurrent chest pain along side an abnormal Cardiolite study.  Her past cardiac history includes CABG in [place] in August of 2001 with two separate vein grafts, one to the circumflex and one to the right coronary artery.  In July of 2002, she had an angioplasty/stent to the LAD in [place], [place].  She developed recurrent chest pain and a catheterization by Dr. Theron Arista C. Nishan on June 27, 2001 revealed preserved EF of 60%, saphenous vein graft to the right coronary artery normal, with an 80% PDA distal lesion.  Saphenous vein graft to the OM had a 95% distal lesion.  LAD had 40% proximal stenosis, with an 80% mid-distal lesion.   The patient then underwent PTCA/stent placement within the saphenous vein graft to the OM by Dr. Loraine Leriche Pulsipher.  The patient underwent this procedure successfully and returned to her room where she remained stable during the rest of her hospitalization.  She was prepared for discharge on June 27, 2001 in stable condition.  DISCHARGE MEDICATIONS:  She will be discharged to home on the following medications: 1. Enteric-coated aspirin 325 mg one p.o. q.d. 2. Plavix 75 mg one p.o. q.d.  She is to resume all other medications including: 1. Vitamins. 2. Lipitor 20 mg one p.o.  q.d. 3. Actos 45 mg a day. 4. Evista daily. 5. Toprol-XL 100 mg one p.o. q.d. 6. Altace 10 mg b.i.d. 7. Lantus q.h.s. 8. Elavil 50 mg at bedtime. 9. She is to resume her Glucophage on June 28, 2001.  ACTIVITY:  No strenuous activity.  DIET:  Remain on a low-fat diabetic diet.  WOUND CARE:  Clean her cath site with soap and water.  FOLLOWUP:  She is to return to the office in eight weeks for followup appointment.  LABORATORY DATA:  Discharge labs include a hemoglobin 12.1, hematocrit 35.0, potassium 4.4, BUN 18, creatinine 0.9. Dictated by:   Guy Franco, P.A. [facility] Attending Physician:  Colon Branch DD:  06/27/01 TD:  06/27/01 Job: [id number]

## 2010-12-09 NOTE — Discharge Summary (Signed)
[place]  Patient:    Carla Friedman, Carla Friedman Visit Number: [id number] MRN: [id number]          Service Type: MED Location: [phone] Attending Physician:  Nelta Numbers Dictated by:   Brita Romp, P.A. Admit Date:  07/04/2001 Discharge Date: 07/09/2001                             Discharge Summary  DISCHARGE DIAGNOSES: 1. Coronary artery disease, status post relook cardiac catheterization. 2. Diabetes mellitus. 3. Hyperlipidemia.  HOSPITAL COURSE:  The patient is a 77 year old female with known coronary artery disease.  On 06/26/01 the patient had undergone catheterization with intervention.  She presented to the emergency room complaining of recurrent substernal chest pain, heaviness which radiated into the neck.  The symptoms were all relieved by nitroglycerin and she stated that her pain was similar to her prior angina.  She was seen and admitted by Dr. Farley Bing.  He started her on Lovenox and planned for relook cardiac catheterization.  The next day the patient was taken to the catheterization laboratory by Dr. Veneda Melter.  CATHETERIZATION RESULTS: 1. Left main: Medium vessel with mild irregularities. 2. Left anterior descending:  Moderate diffuse disease along the entire    course; narrowing to 60% after the first diagonal, followed by a hazy 80%    encompassing the third diagonal.  Subsequently there is further narrowing    of 70% in the distal segment prior to and extending into the previously    placed stent in the distal left anterior descending of 70%. The left    anterior descending is noted to be a small caliber vessel of 2 mm apically    and potentially 2.5 mm at the third diagonal. 3. Left circumflex:  Severe disease of 70 to 80% proximal segment, 100%    occluded.  First and second marginals are filled via saphenous vein graft    to the mid sections. 4. Right coronary artery:  Medium vessel; severe  narrowing of 70% proximally    followed by a long tubular 80 to 90% lesion in the mid section.  Distal    vessel is filled via saphenous vein graft.  PDA is a small vessel with    proximal 70% narrowing. 5. Saphenous vein graft to first marginal of the left circumflex, patent    with mild irregularities. 6. Saphenous vein graft to the distal right coronary artery, patent. 7. Left ventricle:  Left ventriculogram not performed.  Left ______ pressure    LV is 168/10, aortic 168/75, left ventricular end diastolic pressure equals    20.  Dr. Chales Abrahams, after consulting with the patient, proceeded with intervention on the distal LAD.  After multiple angioplasties, he elected to introduce a cutting balloon.  Repeat angiography showed improvement in the vessel lumen, however, there was some evidence of a dissection clear to auscultation prior to the third diagonal as well as extravasation of contrast between the third diagonal and the stent consistent with arterial perforation.  He then inserted a perfusion balloon across the perforation.  He then deployed two stents to cover this area.  TIMI grade 3 flow resulted.  After the case, the patient was transferred to the holding area and received a bedside echocardiogram.  This showed no pericardial effusion, although there was some apical akinesis consistent with severe stunting.  Dr. Chales Abrahams also requested a CVTS consultation for possible bypass to  the LAD.  The patient was then seen by Dr. Kathlee Nations Trigt of CVTS.  After reviewing the catheterization films and examining the patient, Dr. Donata Clay felt there was no need for bypass surgery or pericardial drainage at this time.  Over the next couple of days the patient continued to do well.  She remained hemodynamically stable and was transferred out of the intensive care unit.  On 07/09/01 the patient was seen by Dr. [name]. Nishan.  He felt she was doing well and ready for discharge.  DISCHARGE  MEDICATIONS: 1. Enteric coated aspirin 325 mg q.d. 2. Plavix 75 mg q.d. with therapy to be extended for an additional four weeks. 3. Lipitor 20 mg q.p.m. 4. Evista 60 mg q.d. 5. Altace 2.5 mg b.i.d. 6. Elavil 50 mg q.h.s. 7. Lantus insulin 5 units q.h.s. 8. Actos 45 mg q.d. 9. Toprol XL 200 mg b.i.d.  LABORATORY VALUES:  ______ Dictated by:   Brita Romp, P.A. Attending Physician:  Nelta Numbers DD:  07/09/01 TD:  07/09/01 Job: [id number]

## 2010-12-19 ENCOUNTER — Other Ambulatory Visit: Payer: Self-pay | Admitting: *Deleted

## 2010-12-19 MED ORDER — CLOPIDOGREL BISULFATE 75 MG PO TABS: 75.0000 mg | ORAL_TABLET | Freq: Every day | ORAL | Status: DC

## 2011-01-13 ENCOUNTER — Telehealth: Payer: Self-pay | Admitting: Cardiovascular Disease

## 2011-01-13 NOTE — Telephone Encounter (Signed)
Pt's daughter calling wanting to increase ranexa to two 500mg  tabs daily--is only taking 1 tab per day now--advised i looked at o.v. Note and it says nothing about increasing to two tabs daily--advised normal doseage is 2 tabs qd, but i will have to have debra call you on Monday after she gets OK from dr nishan--daughter agrees--nt

## 2011-01-13 NOTE — Telephone Encounter (Signed)
Pt's dtr requesting ranolazine  Pt to take 1 a day and pt's dtr wants to increase to 2 a day for angina uses [facility] [address]

## 2011-01-16 MED ORDER — RANOLAZINE ER 500 MG PO TB12: 500.0000 mg | ORAL_TABLET | Freq: Two times a day (BID) | ORAL | Status: DC

## 2011-01-16 NOTE — Telephone Encounter (Signed)
Spoke with pt dtr, pt had a real bad episode on Friday and they feel she may have had another MI. Per the pharm MD amanda here in the office, okay given for pt to increase the ranexa to 500mg  twice daily. Script called to [facility] [employer]

## 2011-01-24 ENCOUNTER — Encounter: Payer: Self-pay | Admitting: Cardiovascular Disease

## 2011-01-24 ENCOUNTER — Ambulatory Visit (INDEPENDENT_AMBULATORY_CARE_PROVIDER_SITE_OTHER): Payer: Medicare Other | Admitting: Cardiovascular Disease

## 2011-01-24 DIAGNOSIS — I2510 Atherosclerotic heart disease of native coronary artery without angina pectoris: Secondary | ICD-10-CM

## 2011-01-24 DIAGNOSIS — E785 Hyperlipidemia, unspecified: Secondary | ICD-10-CM

## 2011-01-24 DIAGNOSIS — I10 Essential (primary) hypertension: Secondary | ICD-10-CM

## 2011-01-24 NOTE — Progress Notes (Signed)
Carla Friedman is seen today for CAD, HTN, and elevated lipids. She has had CABG with stents to the native LAD and SVG to OM. She has distal disease and chronic chest pain. She actually is seen by hospice. She prefers not to be seen in hospital alot. She has less chest pain off lisinopril and on Atenolol. . Her last cath was in 11/2006 and she had a non-ischemic myovue in 11/2007. She has morphine and oxygen at home. She has sig. depression. She looked very good today I am not convinced that all of her pains are anginal but she seems satisfied to have oxygen at home.  She also complains of SOB. it does not sound like volume there is no PND, orthopnea, or edema.  She continues to be depressed. She uses oxygen and nitro as a crutch but this has helped her  over the years.  She has significant venous insuf with marked dependant discoloration but no arterial insuf.  Ranexa has helped her quite a bit.   She had a bad episode of SSCP on 6/22.  Took multiple nitro.  Did not want to go to hospital and used oxygen most of day.  I told her in future she should atleast come to the office for CPK/Troponin to document that this is angina.  She also knows she can go to the hospital fo iv Rx like iv nitro which would not necessarily require cath.   ROS: Denies fever, malais, weight loss, blurry vision, decreased visual acuity, cough, sputum, SOB, hemoptysis, pleuritic pain, palpitaitons, heartburn, abdominal pain, melena, lower extremity edema, claudication, or rash.  All other systems reviewed and negative  General: Affect appropriate Healthy:  appears stated age HEENT: normal Neck supple with no adenopathy JVP normal no bruits no thyromegaly Lungs clear with no wheezing and good diaphragmatic motion Heart:  S1/S2 no murmur,rub, gallop or click PMI normal Abdomen: benighn, BS positve, no tenderness, no AAA no bruit.  No HSM or HJR Distal pulses intact with no bruits No edema Neuro non-focal Skin warm and dry No  muscular weakness   Current Outpatient Prescriptions  Medication Sig Dispense Refill  . amitriptyline (ELAVIL) 50 MG tablet Take 50 mg by mouth at bedtime.        . Ascorbic Acid (VITAMIN C) 500 MG tablet Take 500 mg by mouth daily.        Marland Kitchen aspirin 81 MG tablet Take 81 mg by mouth daily.        Marland Kitchen atenolol (TENORMIN) 50 MG tablet Take 50 mg by mouth daily.        . Calcium Carbonate (CALCIUM 600 PO) Take by mouth. 1 po daily       . Cholecalciferol (VITAMIN D) 1000 UNITS capsule Take 1,000 Units by mouth daily.        . clopidogrel (PLAVIX) 75 MG tablet Take 1 tablet (75 mg total) by mouth daily.  90 tablet  3  . insulin glargine (LANTUS SOLOSTAR) 100 UNIT/ML injection Inject into the skin. 10 UNITS Use as directed      . isosorbide dinitrate (ISORDIL) 30 MG tablet Take 1 tablet (30 mg total) by mouth 2 (two) times daily.  60 tablet  11  . metFORMIN (GLUCOPHAGE) 1000 MG tablet Take 1,000 mg by mouth 2 (two) times daily with a meal.        . Multiple Vitamin (MULTIVITAMIN) capsule Take 1 capsule by mouth daily.        . nitroGLYCERIN (NITROSTAT) 0.4 MG SL tablet  Place 0.4 mg under the tongue every 5 (five) minutes as needed.        Marland Kitchen oxycodone (OXY-IR) 5 MG capsule Take 5 mg by mouth every 4 (four) hours as needed.        . pravastatin (PRAVACHOL) 40 MG tablet Take 40 mg by mouth daily.        . promethazine (PHENERGAN) 25 MG tablet Take 25 mg by mouth every 6 (six) hours as needed.        . ramipril (ALTACE) 10 MG capsule Take 1 capsule (10 mg total) by mouth daily.  30 capsule  11  . ranolazine (RANEXA) 500 MG 12 hr tablet Take 1 tablet (500 mg total) by mouth 2 (two) times daily.  60 tablet  12    Allergies  Alprazolam; Amoxicillin; Cephalexin; Codeine; Codeine phosphate; Doxycycline; Hydrochlorothiazide; Morphine; Morphine sulfate; Niacin; and Omeprazole  Electrocardiogram:  NSR 65 LAD otherwise normal  Assessment and Plan

## 2011-01-24 NOTE — Assessment & Plan Note (Signed)
Well controlled.  Continue current medications and low sodium Dash type diet.    

## 2011-01-24 NOTE — Patient Instructions (Signed)
Your physician recommends that you schedule a follow-up appointment in: 6 months  

## 2011-01-24 NOTE — Assessment & Plan Note (Signed)
Cholesterol is at goal.  Continue current dose of statin and diet Rx.  No myalgias or side effects.  F/U  LFT's in 6 months. Lab Results  Component Value Date   LDLCALC  Value: 53        Total Cholesterol/HDL:CHD Risk Coronary Heart Disease Risk Table                     Men   Women  1/2 Average Risk   3.4   3.3  Average Risk       5.0   4.4  2 X Average Risk   9.6   7.1  3 X Average Risk  23.4   11.0        Use the calculated Patient Ratio above and the CHD Risk Table to determine the patient's CHD Risk.        ATP III CLASSIFICATION (LDL):  <100     mg/dL   Optimal  100-129  mg/dL   Near or Above                    Optimal  130-159  mg/dL   Borderline  160-189  mg/dL   High  >190     mg/dL   Very High 11/02/2008             

## 2011-01-24 NOTE — Assessment & Plan Note (Signed)
Chronic pain and anigna with known CAD.  No likely PCI targets.  Patient preference to be DNR/Hospice and not go to hospital.  Good general response to Ranexa with dose increased to bid after episode of pain 6/22.  No ECG changes.  Will check labs CPK/Troponin in future to see what this pain really is

## 2011-02-18 ENCOUNTER — Emergency Department (HOSPITAL_COMMUNITY)
Admission: EM | Admit: 2011-02-18 | Discharge: 2011-02-18 | Disposition: A | Discharge status: Home / Self Care | Payer: Medicare Other | Attending: Emergency Medicine | Admitting: Emergency Medicine

## 2011-02-18 ENCOUNTER — Emergency Department (HOSPITAL_COMMUNITY): Payer: Medicare Other

## 2011-02-18 DIAGNOSIS — I10 Essential (primary) hypertension: Secondary | ICD-10-CM | POA: Insufficient documentation

## 2011-02-18 DIAGNOSIS — I2510 Atherosclerotic heart disease of native coronary artery without angina pectoris: Secondary | ICD-10-CM | POA: Insufficient documentation

## 2011-02-18 DIAGNOSIS — S81009A Unspecified open wound, unspecified knee, initial encounter: Secondary | ICD-10-CM | POA: Insufficient documentation

## 2011-02-18 DIAGNOSIS — Z951 Presence of aortocoronary bypass graft: Secondary | ICD-10-CM | POA: Insufficient documentation

## 2011-02-18 DIAGNOSIS — Z794 Long term (current) use of insulin: Secondary | ICD-10-CM | POA: Insufficient documentation

## 2011-02-18 DIAGNOSIS — Z79899 Other long term (current) drug therapy: Secondary | ICD-10-CM | POA: Insufficient documentation

## 2011-02-18 DIAGNOSIS — W208XXA Other cause of strike by thrown, projected or falling object, initial encounter: Secondary | ICD-10-CM | POA: Insufficient documentation

## 2011-02-18 DIAGNOSIS — E119 Type 2 diabetes mellitus without complications: Secondary | ICD-10-CM | POA: Insufficient documentation

## 2011-02-18 DIAGNOSIS — Z7982 Long term (current) use of aspirin: Secondary | ICD-10-CM | POA: Insufficient documentation

## 2011-02-18 DIAGNOSIS — E785 Hyperlipidemia, unspecified: Secondary | ICD-10-CM | POA: Insufficient documentation

## 2011-03-22 ENCOUNTER — Other Ambulatory Visit: Payer: Self-pay | Admitting: *Deleted

## 2011-03-22 MED ORDER — ATENOLOL 50 MG PO TABS: 50.0000 mg | ORAL_TABLET | Freq: Every day | ORAL | Status: DC

## 2011-03-31 ENCOUNTER — Telehealth: Payer: Self-pay | Admitting: Cardiovascular Disease

## 2011-03-31 ENCOUNTER — Encounter: Payer: Self-pay | Admitting: *Deleted

## 2011-03-31 NOTE — Telephone Encounter (Signed)
Spoke with pt dtr, letter requested will be mailed to her home address [place]

## 2011-03-31 NOTE — Telephone Encounter (Signed)
Patient daughter Danford Bad calling in regards to a letter that need to be written - mother condition  Will explain further when nurse called.

## 2011-05-09 ENCOUNTER — Other Ambulatory Visit: Payer: Self-pay | Admitting: Family Medicine

## 2011-05-11 NOTE — Telephone Encounter (Signed)
Script called in

## 2011-05-28 ENCOUNTER — Other Ambulatory Visit: Payer: Self-pay | Admitting: Family Medicine

## 2011-07-25 DIAGNOSIS — R0602 Shortness of breath: Secondary | ICD-10-CM | POA: Diagnosis not present

## 2011-07-25 DIAGNOSIS — I2510 Atherosclerotic heart disease of native coronary artery without angina pectoris: Secondary | ICD-10-CM | POA: Diagnosis not present

## 2011-07-25 DIAGNOSIS — R630 Anorexia: Secondary | ICD-10-CM | POA: Diagnosis not present

## 2011-07-25 DIAGNOSIS — I209 Angina pectoris, unspecified: Secondary | ICD-10-CM | POA: Diagnosis not present

## 2011-07-25 DIAGNOSIS — R05 Cough: Secondary | ICD-10-CM | POA: Diagnosis not present

## 2011-07-26 DIAGNOSIS — R630 Anorexia: Secondary | ICD-10-CM | POA: Diagnosis not present

## 2011-07-26 DIAGNOSIS — I2510 Atherosclerotic heart disease of native coronary artery without angina pectoris: Secondary | ICD-10-CM | POA: Diagnosis not present

## 2011-07-26 DIAGNOSIS — R05 Cough: Secondary | ICD-10-CM | POA: Diagnosis not present

## 2011-07-26 DIAGNOSIS — R0602 Shortness of breath: Secondary | ICD-10-CM | POA: Diagnosis not present

## 2011-07-26 DIAGNOSIS — I209 Angina pectoris, unspecified: Secondary | ICD-10-CM | POA: Diagnosis not present

## 2011-07-27 ENCOUNTER — Ambulatory Visit (INDEPENDENT_AMBULATORY_CARE_PROVIDER_SITE_OTHER): Payer: Medicare Other | Admitting: Cardiovascular Disease

## 2011-07-27 ENCOUNTER — Encounter: Payer: Self-pay | Admitting: Cardiovascular Disease

## 2011-07-27 VITALS — BP 171/79 | HR 78 | Ht 64.0 in | Wt 154.8 lb

## 2011-07-27 DIAGNOSIS — E785 Hyperlipidemia, unspecified: Secondary | ICD-10-CM

## 2011-07-27 DIAGNOSIS — R0989 Other specified symptoms and signs involving the circulatory and respiratory systems: Secondary | ICD-10-CM | POA: Diagnosis not present

## 2011-07-27 DIAGNOSIS — I2510 Atherosclerotic heart disease of native coronary artery without angina pectoris: Secondary | ICD-10-CM | POA: Diagnosis not present

## 2011-07-27 DIAGNOSIS — R0609 Other forms of dyspnea: Secondary | ICD-10-CM

## 2011-07-27 DIAGNOSIS — R0602 Shortness of breath: Secondary | ICD-10-CM

## 2011-07-27 DIAGNOSIS — R0600 Dyspnea, unspecified: Secondary | ICD-10-CM

## 2011-07-27 DIAGNOSIS — I10 Essential (primary) hypertension: Secondary | ICD-10-CM

## 2011-07-27 NOTE — Patient Instructions (Signed)
Your physician wants you to follow-up in: 6 MONTHS WITH DR NISHAN You will receive a reminder letter in the mail two months in advance. If you don't receive a letter, please call our office to schedule the follow-up appointment. Your physician recommends that you continue on your current medications as directed. Please refer to the Current Medication list given to you today. Your physician has requested that you have an echocardiogram. Echocardiography is a painless test that uses sound waves to create images of your heart. It provides your doctor with information about the size and shape of your heart and how well your heart's chambers and valves are working. This procedure takes approximately one hour. There are no restrictions for this procedure. DX DYSPNEA 

## 2011-07-27 NOTE — Assessment & Plan Note (Signed)
Stable anginal pattern has nitro.  Continue medical Rx

## 2011-07-27 NOTE — Assessment & Plan Note (Signed)
Cholesterol is at goal.  Continue current dose of statin and diet Rx.  No myalgias or side effects.  F/U  LFT's in 6 months. Lab Results  Component Value Date   LDLCALC  Value: 53        Total Cholesterol/HDL:CHD Risk Coronary Heart Disease Risk Table                     Men   Women  1/2 Average Risk   3.4   3.3  Average Risk       5.0   4.4  2 X Average Risk   9.6   7.1  3 X Average Risk  23.4   11.0        Use the calculated Patient Ratio above and the CHD Risk Table to determine the patient's CHD Risk.        ATP III CLASSIFICATION (LDL):  <100     mg/dL   Optimal  100-129  mg/dL   Near or Above                    Optimal  130-159  mg/dL   Borderline  160-189  mg/dL   High  >190     mg/dL   Very High 11/02/2008             

## 2011-07-27 NOTE — Assessment & Plan Note (Signed)
Well controlled.  Continue current medications and low sodium Dash type diet.    

## 2011-07-27 NOTE — Assessment & Plan Note (Signed)
Normal cardiopulmonary exam.  Echo to assess

## 2011-07-27 NOTE — Progress Notes (Signed)
Carla Friedman is seen today for CAD, HTN, and elevated lipids. She has had CABG with stents to the native LAD and SVG to OM. She has distal disease and chronic chest pain. She actually is seen by hospice. She prefers not to be seen in hospital alot. She has less chest pain off lisinopril and on Atenolol. . Her last cath was in 11/2006 and she had a non-ischemic myovue in 11/2007. She has morphine and oxygen at home. She has sig. depression. She looked very good today I am not convinced that all of her pains are anginal but she seems satisfied to have oxygen at home.  She also complains of SOB. it does not sound like volume there is no PND, orthopnea, or edema.  She continues to be depressed. She uses oxygen and nitro as a crutch but this has helped her  over the years.  She has significant venous insuf with marked dependant discoloration but no arterial insuf.  Ranexa has helped her quite a bit.   Increaseing dyspnea with exertion.     ROS: Denies fever, malais, weight loss, blurry vision, decreased visual acuity, cough, sputum, SOB, hemoptysis, pleuritic pain, palpitaitons, heartburn, abdominal pain, melena, lower extremity edema, claudication, or rash.  All other systems reviewed and negative  General: Affect appropriate Healthy:  appears stated age HEENT: normal Neck supple with no adenopathy JVP normal no bruits no thyromegaly Lungs clear with no wheezing and good diaphragmatic motion Heart:  S1/S2 no murmur,rub, gallop or click PMI normal Abdomen: benighn, BS positve, no tenderness, no AAA no bruit.  No HSM or HJR Distal pulses intact with no bruits Mild venous insuficiency Neuro non-focal Skin warm and dry No muscular weakness   Current Outpatient Prescriptions  Medication Sig Dispense Refill  . amitriptyline (ELAVIL) 50 MG tablet TAKE ONE TABLET BY MOUTH AT BEDTIME  30 tablet  9  . Ascorbic Acid (VITAMIN C) 500 MG tablet Take 500 mg by mouth daily.        Marland Kitchen aspirin 81 MG tablet Take 81  mg by mouth daily.        Marland Kitchen atenolol (TENORMIN) 50 MG tablet Take 1 tablet (50 mg total) by mouth daily.  30 tablet  6  . Calcium Carbonate (CALCIUM 600 PO) Take by mouth. 1 po daily       . Cholecalciferol (VITAMIN D) 1000 UNITS capsule Take 1,000 Units by mouth daily.        . clopidogrel (PLAVIX) 75 MG tablet Take 1 tablet (75 mg total) by mouth daily.  90 tablet  3  . isosorbide dinitrate (ISORDIL) 30 MG tablet Take 1 tablet (30 mg total) by mouth 2 (two) times daily.  60 tablet  11  . LANTUS 100 UNIT/ML injection INJECT 10 UNITS AT BEDTIME  10 mL  5  . metFORMIN (GLUCOPHAGE) 1000 MG tablet Take 1,000 mg by mouth 2 (two) times daily with a meal.        . Multiple Vitamin (MULTIVITAMIN) capsule Take 1 capsule by mouth daily.        . nitroGLYCERIN (NITROSTAT) 0.4 MG SL tablet Place 0.4 mg under the tongue every 5 (five) minutes as needed.        Marland Kitchen oxycodone (OXY-IR) 5 MG capsule Take 5 mg by mouth every 4 (four) hours as needed.        . pravastatin (PRAVACHOL) 40 MG tablet Take 40 mg by mouth daily.        . promethazine (PHENERGAN) 25 MG tablet  Take 25 mg by mouth every 6 (six) hours as needed.        . ramipril (ALTACE) 10 MG capsule Take 1 capsule (10 mg total) by mouth daily.  30 capsule  11  . ranolazine (RANEXA) 500 MG 12 hr tablet Take 1 tablet (500 mg total) by mouth 2 (two) times daily.  60 tablet  12    Allergies  Alprazolam; Amoxicillin; Cephalexin; Codeine; Codeine phosphate; Doxycycline; Hydrochlorothiazide; Morphine; Morphine sulfate; Niacin; and Omeprazole  Electrocardiogram:  Assessment and Plan

## 2011-07-28 DIAGNOSIS — R0602 Shortness of breath: Secondary | ICD-10-CM | POA: Diagnosis not present

## 2011-07-28 DIAGNOSIS — R05 Cough: Secondary | ICD-10-CM | POA: Diagnosis not present

## 2011-07-28 DIAGNOSIS — R630 Anorexia: Secondary | ICD-10-CM | POA: Diagnosis not present

## 2011-07-28 DIAGNOSIS — I2510 Atherosclerotic heart disease of native coronary artery without angina pectoris: Secondary | ICD-10-CM | POA: Diagnosis not present

## 2011-07-28 DIAGNOSIS — I209 Angina pectoris, unspecified: Secondary | ICD-10-CM | POA: Diagnosis not present

## 2011-08-01 DIAGNOSIS — R0602 Shortness of breath: Secondary | ICD-10-CM | POA: Diagnosis not present

## 2011-08-01 DIAGNOSIS — R05 Cough: Secondary | ICD-10-CM | POA: Diagnosis not present

## 2011-08-01 DIAGNOSIS — I209 Angina pectoris, unspecified: Secondary | ICD-10-CM | POA: Diagnosis not present

## 2011-08-01 DIAGNOSIS — R630 Anorexia: Secondary | ICD-10-CM | POA: Diagnosis not present

## 2011-08-01 DIAGNOSIS — I2510 Atherosclerotic heart disease of native coronary artery without angina pectoris: Secondary | ICD-10-CM | POA: Diagnosis not present

## 2011-08-02 ENCOUNTER — Ambulatory Visit (HOSPITAL_COMMUNITY): Payer: Medicare Other | Attending: Cardiology

## 2011-08-02 DIAGNOSIS — E785 Hyperlipidemia, unspecified: Secondary | ICD-10-CM | POA: Insufficient documentation

## 2011-08-02 DIAGNOSIS — R0609 Other forms of dyspnea: Secondary | ICD-10-CM | POA: Diagnosis not present

## 2011-08-02 DIAGNOSIS — R0989 Other specified symptoms and signs involving the circulatory and respiratory systems: Secondary | ICD-10-CM

## 2011-08-02 DIAGNOSIS — R0600 Dyspnea, unspecified: Secondary | ICD-10-CM

## 2011-08-02 DIAGNOSIS — I10 Essential (primary) hypertension: Secondary | ICD-10-CM | POA: Insufficient documentation

## 2011-08-02 DIAGNOSIS — R079 Chest pain, unspecified: Secondary | ICD-10-CM | POA: Insufficient documentation

## 2011-08-08 ENCOUNTER — Other Ambulatory Visit: Payer: Self-pay | Admitting: Family Medicine

## 2011-08-08 DIAGNOSIS — I209 Angina pectoris, unspecified: Secondary | ICD-10-CM | POA: Diagnosis not present

## 2011-08-08 DIAGNOSIS — R630 Anorexia: Secondary | ICD-10-CM | POA: Diagnosis not present

## 2011-08-08 DIAGNOSIS — R0602 Shortness of breath: Secondary | ICD-10-CM | POA: Diagnosis not present

## 2011-08-08 DIAGNOSIS — R05 Cough: Secondary | ICD-10-CM | POA: Diagnosis not present

## 2011-08-08 DIAGNOSIS — I2510 Atherosclerotic heart disease of native coronary artery without angina pectoris: Secondary | ICD-10-CM | POA: Diagnosis not present

## 2011-08-11 DIAGNOSIS — R05 Cough: Secondary | ICD-10-CM | POA: Diagnosis not present

## 2011-08-11 DIAGNOSIS — I2510 Atherosclerotic heart disease of native coronary artery without angina pectoris: Secondary | ICD-10-CM | POA: Diagnosis not present

## 2011-08-11 DIAGNOSIS — R0602 Shortness of breath: Secondary | ICD-10-CM | POA: Diagnosis not present

## 2011-08-11 DIAGNOSIS — R630 Anorexia: Secondary | ICD-10-CM | POA: Diagnosis not present

## 2011-08-11 DIAGNOSIS — I209 Angina pectoris, unspecified: Secondary | ICD-10-CM | POA: Diagnosis not present

## 2011-08-17 DIAGNOSIS — I209 Angina pectoris, unspecified: Secondary | ICD-10-CM | POA: Diagnosis not present

## 2011-08-17 DIAGNOSIS — I2510 Atherosclerotic heart disease of native coronary artery without angina pectoris: Secondary | ICD-10-CM | POA: Diagnosis not present

## 2011-08-17 DIAGNOSIS — R05 Cough: Secondary | ICD-10-CM | POA: Diagnosis not present

## 2011-08-17 DIAGNOSIS — R630 Anorexia: Secondary | ICD-10-CM | POA: Diagnosis not present

## 2011-08-17 DIAGNOSIS — R0602 Shortness of breath: Secondary | ICD-10-CM | POA: Diagnosis not present

## 2011-08-22 DIAGNOSIS — R0602 Shortness of breath: Secondary | ICD-10-CM | POA: Diagnosis not present

## 2011-08-22 DIAGNOSIS — R630 Anorexia: Secondary | ICD-10-CM | POA: Diagnosis not present

## 2011-08-22 DIAGNOSIS — I2510 Atherosclerotic heart disease of native coronary artery without angina pectoris: Secondary | ICD-10-CM | POA: Diagnosis not present

## 2011-08-22 DIAGNOSIS — I209 Angina pectoris, unspecified: Secondary | ICD-10-CM | POA: Diagnosis not present

## 2011-08-22 DIAGNOSIS — R05 Cough: Secondary | ICD-10-CM | POA: Diagnosis not present

## 2011-08-23 ENCOUNTER — Ambulatory Visit (INDEPENDENT_AMBULATORY_CARE_PROVIDER_SITE_OTHER): Payer: Medicare Other | Admitting: Family Medicine

## 2011-08-23 ENCOUNTER — Encounter: Payer: Self-pay | Admitting: Family Medicine

## 2011-08-23 VITALS — BP 140/70 | HR 71 | Temp 98.0°F | Wt 161.0 lb

## 2011-08-23 DIAGNOSIS — I2510 Atherosclerotic heart disease of native coronary artery without angina pectoris: Secondary | ICD-10-CM | POA: Diagnosis not present

## 2011-08-23 DIAGNOSIS — I10 Essential (primary) hypertension: Secondary | ICD-10-CM | POA: Diagnosis not present

## 2011-08-23 DIAGNOSIS — Z209 Contact with and (suspected) exposure to unspecified communicable disease: Secondary | ICD-10-CM | POA: Diagnosis not present

## 2011-08-23 DIAGNOSIS — E119 Type 2 diabetes mellitus without complications: Secondary | ICD-10-CM | POA: Diagnosis not present

## 2011-08-23 LAB — BASIC METABOLIC PANEL
BUN: 19 mg/dL (ref 6–23)
Calcium: 9.5 mg/dL (ref 8.4–10.5)
Creatinine, Ser: 1.2 mg/dL (ref 0.4–1.2)
GFR: 46.53 mL/min — ABNORMAL LOW (ref >60.00)

## 2011-08-23 LAB — HEMOGLOBIN A1C: Hgb A1c MFr Bld: 6.3 % (ref 4.6–6.5)

## 2011-08-23 LAB — CBC WITH DIFFERENTIAL/PLATELET
Hemoglobin: 12.3 g/dL (ref 12.0–15.0)
Lymphocytes Relative: 38.4 % (ref 12.0–46.0)
Monocytes Relative: 7.4 % (ref 3.0–12.0)
Neutro Abs: 3.8 10*3/uL (ref 1.4–7.7)
Neutrophils Relative %: 47.4 % (ref 43.0–77.0)
RBC: 4.02 Mil/uL (ref 3.87–5.11)
RDW: 14.8 % — ABNORMAL HIGH (ref 11.5–14.6)

## 2011-08-23 LAB — POCT URINALYSIS DIPSTICK
Ketones, UA: NEGATIVE
Nitrite, UA: NEGATIVE

## 2011-08-23 LAB — TSH: TSH: 3.7 u[IU]/mL (ref 0.35–5.50)

## 2011-08-23 NOTE — Progress Notes (Signed)
  Subjective:    Patient ID: Carla Friedman, female    DOB: August 10, 1933, 78 y.o.   MRN: [id number]  HPI Here with hier daughter, Carla Friedman End (cell [phone]) for follow up. She is doing well in general, although she does have frequent chest pains and SOB from her CAD. She has been enrolled in Hospice for the past 2 years, under the direction of Dr. Eden Emms. She is very inactive and rests in her recliner most of the day. She does go to [employer] in her wheelchair once a week to shop. Her daughter is looking into checking her into an assisted living facility for Hospice respite for a short time, since her daughter has a surgery coming up soon that will prevent her from caring for [facility]. She needs an FL2 filled out and a PPD. Her diabetes is well controlled, infact she is skipping doses of Mertformin because her glucoses even after meals is in the 110 to 140 range. Her appetite is very poor. Her recent ECHO showed an EF of 60-65%. She gets sonn relief from chest pains with Ranexa.    Review of Systems  Constitutional: Positive for appetite change and fatigue.  Respiratory: Positive for shortness of breath. Negative for apnea, cough, chest tightness and wheezing.   Cardiovascular: Positive for chest pain. Negative for palpitations and leg swelling.       Objective:   Physical Exam  Constitutional: She appears well-developed and well-nourished. No distress.  Neck: No thyromegaly present.  Cardiovascular: Normal rate, regular rhythm, normal heart sounds and intact distal pulses.   Pulmonary/Chest: Effort normal and breath sounds normal. She has no wheezes. She has no rales.  Lymphadenopathy:    She has no cervical adenopathy.          Assessment & Plan:  She seems to be stable. Get labs today to check her diabetes. PPD was placed. We will see her back in 2 days

## 2011-08-24 LAB — HEPATIC FUNCTION PANEL: Total Bilirubin: 0.6 mg/dL (ref 0.3–1.2)

## 2011-08-25 DIAGNOSIS — I2510 Atherosclerotic heart disease of native coronary artery without angina pectoris: Secondary | ICD-10-CM | POA: Diagnosis not present

## 2011-08-25 DIAGNOSIS — R059 Cough, unspecified: Secondary | ICD-10-CM | POA: Diagnosis not present

## 2011-08-25 DIAGNOSIS — I209 Angina pectoris, unspecified: Secondary | ICD-10-CM | POA: Diagnosis not present

## 2011-08-25 DIAGNOSIS — R0602 Shortness of breath: Secondary | ICD-10-CM | POA: Diagnosis not present

## 2011-08-25 DIAGNOSIS — R630 Anorexia: Secondary | ICD-10-CM | POA: Diagnosis not present

## 2011-09-06 ENCOUNTER — Telehealth: Payer: Self-pay | Admitting: Family Medicine

## 2011-09-06 NOTE — Telephone Encounter (Signed)
Pt called req to get lab results from 08/23/11. Pls call. Pt is moving to [employer] today.

## 2011-09-07 NOTE — Telephone Encounter (Signed)
Her labs were all normal except she has a UTI. Call in Cipro 500 mg bid for 7 days

## 2011-09-07 NOTE — Telephone Encounter (Signed)
Left a message for pt to return call 

## 2011-09-08 MED ORDER — CIPROFLOXACIN HCL 500 MG PO TABS: 500.0000 mg | ORAL_TABLET | Freq: Two times a day (BID) | ORAL | Status: AC

## 2011-09-08 NOTE — Telephone Encounter (Signed)
Spoke with pt and sent script e-scribe. 

## 2011-09-11 ENCOUNTER — Other Ambulatory Visit: Payer: Self-pay | Admitting: Family Medicine

## 2011-09-14 ENCOUNTER — Other Ambulatory Visit: Payer: Self-pay

## 2011-09-14 MED ORDER — PRAVASTATIN SODIUM 40 MG PO TABS: 40.0000 mg | ORAL_TABLET | Freq: Every day | ORAL | Status: DC

## 2011-09-22 DIAGNOSIS — I2510 Atherosclerotic heart disease of native coronary artery without angina pectoris: Secondary | ICD-10-CM | POA: Diagnosis not present

## 2011-09-22 DIAGNOSIS — I209 Angina pectoris, unspecified: Secondary | ICD-10-CM | POA: Diagnosis not present

## 2011-09-22 DIAGNOSIS — R05 Cough: Secondary | ICD-10-CM | POA: Diagnosis not present

## 2011-09-22 DIAGNOSIS — R0602 Shortness of breath: Secondary | ICD-10-CM | POA: Diagnosis not present

## 2011-09-22 DIAGNOSIS — R630 Anorexia: Secondary | ICD-10-CM | POA: Diagnosis not present

## 2011-09-25 DIAGNOSIS — R0602 Shortness of breath: Secondary | ICD-10-CM | POA: Diagnosis not present

## 2011-09-25 DIAGNOSIS — R630 Anorexia: Secondary | ICD-10-CM | POA: Diagnosis not present

## 2011-09-25 DIAGNOSIS — I2510 Atherosclerotic heart disease of native coronary artery without angina pectoris: Secondary | ICD-10-CM | POA: Diagnosis not present

## 2011-09-25 DIAGNOSIS — I209 Angina pectoris, unspecified: Secondary | ICD-10-CM | POA: Diagnosis not present

## 2011-09-25 DIAGNOSIS — R05 Cough: Secondary | ICD-10-CM | POA: Diagnosis not present

## 2011-09-28 DIAGNOSIS — R630 Anorexia: Secondary | ICD-10-CM | POA: Diagnosis not present

## 2011-09-28 DIAGNOSIS — R0602 Shortness of breath: Secondary | ICD-10-CM | POA: Diagnosis not present

## 2011-09-28 DIAGNOSIS — I2510 Atherosclerotic heart disease of native coronary artery without angina pectoris: Secondary | ICD-10-CM | POA: Diagnosis not present

## 2011-09-28 DIAGNOSIS — I209 Angina pectoris, unspecified: Secondary | ICD-10-CM | POA: Diagnosis not present

## 2011-09-28 DIAGNOSIS — R05 Cough: Secondary | ICD-10-CM | POA: Diagnosis not present

## 2011-09-29 DIAGNOSIS — I2510 Atherosclerotic heart disease of native coronary artery without angina pectoris: Secondary | ICD-10-CM | POA: Diagnosis not present

## 2011-09-29 DIAGNOSIS — R0602 Shortness of breath: Secondary | ICD-10-CM | POA: Diagnosis not present

## 2011-09-29 DIAGNOSIS — R630 Anorexia: Secondary | ICD-10-CM | POA: Diagnosis not present

## 2011-09-29 DIAGNOSIS — I209 Angina pectoris, unspecified: Secondary | ICD-10-CM | POA: Diagnosis not present

## 2011-09-29 DIAGNOSIS — R05 Cough: Secondary | ICD-10-CM | POA: Diagnosis not present

## 2011-10-02 DIAGNOSIS — I209 Angina pectoris, unspecified: Secondary | ICD-10-CM | POA: Diagnosis not present

## 2011-10-02 DIAGNOSIS — R630 Anorexia: Secondary | ICD-10-CM | POA: Diagnosis not present

## 2011-10-02 DIAGNOSIS — R05 Cough: Secondary | ICD-10-CM | POA: Diagnosis not present

## 2011-10-02 DIAGNOSIS — I2510 Atherosclerotic heart disease of native coronary artery without angina pectoris: Secondary | ICD-10-CM | POA: Diagnosis not present

## 2011-10-02 DIAGNOSIS — R0602 Shortness of breath: Secondary | ICD-10-CM | POA: Diagnosis not present

## 2011-10-05 DIAGNOSIS — R630 Anorexia: Secondary | ICD-10-CM | POA: Diagnosis not present

## 2011-10-05 DIAGNOSIS — I2510 Atherosclerotic heart disease of native coronary artery without angina pectoris: Secondary | ICD-10-CM | POA: Diagnosis not present

## 2011-10-05 DIAGNOSIS — I209 Angina pectoris, unspecified: Secondary | ICD-10-CM | POA: Diagnosis not present

## 2011-10-05 DIAGNOSIS — R0602 Shortness of breath: Secondary | ICD-10-CM | POA: Diagnosis not present

## 2011-10-05 DIAGNOSIS — R05 Cough: Secondary | ICD-10-CM | POA: Diagnosis not present

## 2011-10-11 ENCOUNTER — Other Ambulatory Visit: Payer: Self-pay

## 2011-10-11 DIAGNOSIS — I2510 Atherosclerotic heart disease of native coronary artery without angina pectoris: Secondary | ICD-10-CM | POA: Diagnosis not present

## 2011-10-11 DIAGNOSIS — C44621 Squamous cell carcinoma of skin of unspecified upper limb, including shoulder: Secondary | ICD-10-CM | POA: Diagnosis not present

## 2011-10-11 DIAGNOSIS — L570 Actinic keratosis: Secondary | ICD-10-CM | POA: Diagnosis not present

## 2011-10-11 DIAGNOSIS — R0789 Other chest pain: Secondary | ICD-10-CM

## 2011-10-11 DIAGNOSIS — R630 Anorexia: Secondary | ICD-10-CM | POA: Diagnosis not present

## 2011-10-11 DIAGNOSIS — R05 Cough: Secondary | ICD-10-CM | POA: Diagnosis not present

## 2011-10-11 DIAGNOSIS — D0460 Carcinoma in situ of skin of unspecified upper limb, including shoulder: Secondary | ICD-10-CM | POA: Diagnosis not present

## 2011-10-11 DIAGNOSIS — R0602 Shortness of breath: Secondary | ICD-10-CM | POA: Diagnosis not present

## 2011-10-11 DIAGNOSIS — I209 Angina pectoris, unspecified: Secondary | ICD-10-CM | POA: Diagnosis not present

## 2011-10-11 DIAGNOSIS — D485 Neoplasm of uncertain behavior of skin: Secondary | ICD-10-CM | POA: Diagnosis not present

## 2011-10-11 DIAGNOSIS — Z85828 Personal history of other malignant neoplasm of skin: Secondary | ICD-10-CM | POA: Diagnosis not present

## 2011-10-11 MED ORDER — RAMIPRIL 10 MG PO CAPS: 10.0000 mg | ORAL_CAPSULE | Freq: Every day | ORAL | Status: DC

## 2011-10-11 MED ORDER — ATENOLOL 50 MG PO TABS: 50.0000 mg | ORAL_TABLET | Freq: Every day | ORAL | Status: DC

## 2011-10-12 DIAGNOSIS — I2510 Atherosclerotic heart disease of native coronary artery without angina pectoris: Secondary | ICD-10-CM | POA: Diagnosis not present

## 2011-10-12 DIAGNOSIS — R630 Anorexia: Secondary | ICD-10-CM | POA: Diagnosis not present

## 2011-10-12 DIAGNOSIS — R05 Cough: Secondary | ICD-10-CM | POA: Diagnosis not present

## 2011-10-12 DIAGNOSIS — I209 Angina pectoris, unspecified: Secondary | ICD-10-CM | POA: Diagnosis not present

## 2011-10-12 DIAGNOSIS — R0602 Shortness of breath: Secondary | ICD-10-CM | POA: Diagnosis not present

## 2011-10-18 DIAGNOSIS — R05 Cough: Secondary | ICD-10-CM | POA: Diagnosis not present

## 2011-10-18 DIAGNOSIS — I209 Angina pectoris, unspecified: Secondary | ICD-10-CM | POA: Diagnosis not present

## 2011-10-18 DIAGNOSIS — R0602 Shortness of breath: Secondary | ICD-10-CM | POA: Diagnosis not present

## 2011-10-18 DIAGNOSIS — I2510 Atherosclerotic heart disease of native coronary artery without angina pectoris: Secondary | ICD-10-CM | POA: Diagnosis not present

## 2011-10-18 DIAGNOSIS — R630 Anorexia: Secondary | ICD-10-CM | POA: Diagnosis not present

## 2011-10-23 DIAGNOSIS — R0602 Shortness of breath: Secondary | ICD-10-CM | POA: Diagnosis not present

## 2011-10-23 DIAGNOSIS — I2510 Atherosclerotic heart disease of native coronary artery without angina pectoris: Secondary | ICD-10-CM | POA: Diagnosis not present

## 2011-10-23 DIAGNOSIS — I209 Angina pectoris, unspecified: Secondary | ICD-10-CM | POA: Diagnosis not present

## 2011-10-23 DIAGNOSIS — R630 Anorexia: Secondary | ICD-10-CM | POA: Diagnosis not present

## 2011-10-23 DIAGNOSIS — R059 Cough, unspecified: Secondary | ICD-10-CM | POA: Diagnosis not present

## 2011-10-25 ENCOUNTER — Telehealth: Payer: Self-pay | Admitting: Cardiovascular Disease

## 2011-10-25 NOTE — Telephone Encounter (Signed)
Spoke with Carla Friedman, aware code from last office note for echop is dyspnea.

## 2011-10-25 NOTE — Telephone Encounter (Signed)
New Msg: [name] from [facility] calling wanting to check on DX for pt ECHO. Please return call to discuss further.

## 2011-11-15 ENCOUNTER — Other Ambulatory Visit: Payer: Self-pay | Admitting: Cardiovascular Disease

## 2011-11-22 DIAGNOSIS — R0602 Shortness of breath: Secondary | ICD-10-CM | POA: Diagnosis not present

## 2011-11-22 DIAGNOSIS — I2510 Atherosclerotic heart disease of native coronary artery without angina pectoris: Secondary | ICD-10-CM | POA: Diagnosis not present

## 2011-11-22 DIAGNOSIS — R630 Anorexia: Secondary | ICD-10-CM | POA: Diagnosis not present

## 2011-11-22 DIAGNOSIS — I209 Angina pectoris, unspecified: Secondary | ICD-10-CM | POA: Diagnosis not present

## 2011-11-22 DIAGNOSIS — R059 Cough, unspecified: Secondary | ICD-10-CM | POA: Diagnosis not present

## 2011-11-29 ENCOUNTER — Telehealth: Payer: Self-pay | Admitting: Cardiovascular Disease

## 2011-11-29 NOTE — Telephone Encounter (Signed)
New msg Hospice nurse calling about swelling of her legs. She said she doesn't take any diuretic. Please call

## 2011-11-30 ENCOUNTER — Telehealth: Payer: Self-pay | Admitting: *Deleted

## 2011-11-30 NOTE — Telephone Encounter (Signed)
Phoned lexa-home health provider- to make her aware that dr Eden Emms wants her to call Carla Friedman's PCP or pulmonary doctor for any orders concerning leg swelling as she has chronic venous insufficiency and her heart is not the problem--nt

## 2011-12-08 ENCOUNTER — Other Ambulatory Visit: Payer: Self-pay | Admitting: Cardiovascular Disease

## 2011-12-20 ENCOUNTER — Telehealth: Payer: Self-pay | Admitting: Family Medicine

## 2011-12-20 MED ORDER — FUROSEMIDE 20 MG PO TABS: 20.0000 mg | ORAL_TABLET | Freq: Every day | ORAL | Status: DC

## 2011-12-20 NOTE — Telephone Encounter (Signed)
Call in Lasix 20 mg a day, #30 with 5 rf

## 2011-12-20 NOTE — Telephone Encounter (Signed)
Edema in both lower legs 2 +, on going for several weeks. Pt fell on 4/29/21013, right side was bruised and now swollen, tender to touch. No pain unless touching the area. Please call Hospice nurse [name] [phone].

## 2011-12-20 NOTE — Telephone Encounter (Signed)
I sent script e-scribe and left voice message for [name].

## 2011-12-23 DIAGNOSIS — R059 Cough, unspecified: Secondary | ICD-10-CM | POA: Diagnosis not present

## 2011-12-23 DIAGNOSIS — R0602 Shortness of breath: Secondary | ICD-10-CM | POA: Diagnosis not present

## 2011-12-23 DIAGNOSIS — R630 Anorexia: Secondary | ICD-10-CM | POA: Diagnosis not present

## 2011-12-23 DIAGNOSIS — I2510 Atherosclerotic heart disease of native coronary artery without angina pectoris: Secondary | ICD-10-CM | POA: Diagnosis not present

## 2011-12-23 DIAGNOSIS — I209 Angina pectoris, unspecified: Secondary | ICD-10-CM | POA: Diagnosis not present

## 2012-01-08 ENCOUNTER — Telehealth: Payer: Self-pay | Admitting: Family Medicine

## 2012-01-08 NOTE — Telephone Encounter (Signed)
Please call about Lasix. Pt seems to be fatigued, wiped out, and wants to sleep all the time. Nurse wondering if it could be electrolyte levels? Please call.

## 2012-01-09 NOTE — Telephone Encounter (Signed)
I called Hospice and gave verbal order for the lab draw. They will draw either today or tomorrow.

## 2012-01-09 NOTE — Telephone Encounter (Signed)
Call Hospice and have them get a CBC, BMET, TSH, and UA on her

## 2012-01-10 DIAGNOSIS — N390 Urinary tract infection, site not specified: Secondary | ICD-10-CM | POA: Diagnosis not present

## 2012-01-15 ENCOUNTER — Telehealth: Payer: Self-pay | Admitting: Family Medicine

## 2012-01-15 MED ORDER — CIPROFLOXACIN HCL 500 MG PO TABS: 500.0000 mg | ORAL_TABLET | Freq: Two times a day (BID) | ORAL | Status: AC

## 2012-01-15 NOTE — Telephone Encounter (Signed)
I spoke with pt about urine results. She is positive for a UTI and Dr. Clent Ridges ordered Cipro 500 mg take 1 po bid X 7 days and I sent script e-scribe.

## 2012-01-19 ENCOUNTER — Encounter: Payer: Self-pay | Admitting: Family Medicine

## 2012-01-22 DIAGNOSIS — I209 Angina pectoris, unspecified: Secondary | ICD-10-CM | POA: Diagnosis not present

## 2012-01-22 DIAGNOSIS — I2510 Atherosclerotic heart disease of native coronary artery without angina pectoris: Secondary | ICD-10-CM | POA: Diagnosis not present

## 2012-01-22 DIAGNOSIS — R059 Cough, unspecified: Secondary | ICD-10-CM | POA: Diagnosis not present

## 2012-01-22 DIAGNOSIS — R630 Anorexia: Secondary | ICD-10-CM | POA: Diagnosis not present

## 2012-01-22 DIAGNOSIS — R0602 Shortness of breath: Secondary | ICD-10-CM | POA: Diagnosis not present

## 2012-02-02 ENCOUNTER — Encounter: Payer: Self-pay | Admitting: Cardiovascular Disease

## 2012-02-02 ENCOUNTER — Ambulatory Visit (INDEPENDENT_AMBULATORY_CARE_PROVIDER_SITE_OTHER): Payer: Medicare Other | Admitting: Cardiovascular Disease

## 2012-02-02 VITALS — BP 128/81 | HR 67 | Ht 64.0 in | Wt 159.0 lb

## 2012-02-02 DIAGNOSIS — E119 Type 2 diabetes mellitus without complications: Secondary | ICD-10-CM

## 2012-02-02 DIAGNOSIS — I2510 Atherosclerotic heart disease of native coronary artery without angina pectoris: Secondary | ICD-10-CM

## 2012-02-02 DIAGNOSIS — E785 Hyperlipidemia, unspecified: Secondary | ICD-10-CM | POA: Diagnosis not present

## 2012-02-02 DIAGNOSIS — I10 Essential (primary) hypertension: Secondary | ICD-10-CM

## 2012-02-02 DIAGNOSIS — R0789 Other chest pain: Secondary | ICD-10-CM

## 2012-02-02 MED ORDER — RANOLAZINE ER 500 MG PO TB12: 500.0000 mg | ORAL_TABLET | Freq: Two times a day (BID) | ORAL | Status: DC

## 2012-02-02 MED ORDER — PRAVASTATIN SODIUM 40 MG PO TABS: 40.0000 mg | ORAL_TABLET | Freq: Every day | ORAL | Status: DC

## 2012-02-02 MED ORDER — ATENOLOL 50 MG PO TABS: 50.0000 mg | ORAL_TABLET | Freq: Every day | ORAL | Status: DC

## 2012-02-02 MED ORDER — RAMIPRIL 10 MG PO CAPS: 10.0000 mg | ORAL_CAPSULE | Freq: Every day | ORAL | Status: DC

## 2012-02-02 NOTE — Assessment & Plan Note (Signed)
Cholesterol is at goal.  Continue current dose of statin and diet Rx.  No myalgias or side effects.  F/U  LFT's in 6 months. Lab Results  Component Value Date   LDLCALC  Value: 53        Total Cholesterol/HDL:CHD Risk Coronary Heart Disease Risk Table                     Men   Women  1/2 Average Risk   3.4   3.3  Average Risk       5.0   4.4  2 X Average Risk   9.6   7.1  3 X Average Risk  23.4   11.0        Use the calculated Patient Ratio above and the CHD Risk Table to determine the patient's CHD Risk.        ATP III CLASSIFICATION (LDL):  <100     mg/dL   Optimal  100-129  mg/dL   Near or Above                    Optimal  130-159  mg/dL   Borderline  160-189  mg/dL   High  >190     mg/dL   Very High 11/02/2008             

## 2012-02-02 NOTE — Progress Notes (Signed)
Patient ID: Carla Friedman, female   DOB: 1934-07-20, 78 y.o.   MRN: [id number] Presley is seen today for CAD, HTN, and elevated lipids. She has had CABG with stents to the native LAD and SVG to OM. She has distal disease and chronic chest pain. She actually is seen by hospice. She prefers not to be seen in hospital alot. She has less chest pain off lisinopril and on Atenolol. . Her last cath was in 11/2006 and she had a non-ischemic myovue in 11/2007. She has morphine and oxygen at home. She has sig. depression. She looked very good today I am not convinced that all of her pains are anginal but she seems satisfied to have oxygen at home.  She also complains of SOB. it does not sound like volume there is no PND, orthopnea, or edema.  She continues to be depressed. She uses oxygen and nitro as a crutch but this has helped her  over the years.  She has significant venous insuf with marked dependant discoloration but no arterial insuf.  Ranexa has helped her quite a bit.   No at [employer].  Had 3 hours of pain last Wendsday and took 5 nitro.  Discussed ability go to hospital and have cath if needed. But Velvia has preferred conservative Care over the last 5 years   ROS: Denies fever, malais, weight loss, blurry vision, decreased visual acuity, cough, sputum, SOB, hemoptysis, pleuritic pain, palpitaitons, heartburn, abdominal pain, melena, lower extremity edema, claudication, or rash.  All other systems reviewed and negative  General: Affect appropriate Healthy:  appears stated age HEENT: normal Neck supple with no adenopathy JVP normal no bruits no thyromegaly Lungs clear with no wheezing and good diaphragmatic motion Heart:  S1/S2 no murmur, no rub, gallop or click PMI normal Abdomen: benighn, BS positve, no tenderness, no AAA no bruit.  No HSM or HJR Distal pulses intact with no bruits No edema Neuro non-focal Skin warm and dry No muscular weakness   Current Outpatient Prescriptions    Medication Sig Dispense Refill  . amitriptyline (ELAVIL) 50 MG tablet TAKE ONE TABLET BY MOUTH AT BEDTIME  30 tablet  9  . Ascorbic Acid (VITAMIN C) 500 MG tablet Take 500 mg by mouth daily.        Marland Kitchen aspirin 81 MG tablet Take 81 mg by mouth daily.        Marland Kitchen atenolol (TENORMIN) 50 MG tablet Take 1 tablet (50 mg total) by mouth daily.  30 tablet  3  . Bee Pollen 500 MG TABS Take by mouth.        . Calcium Carbonate (CALCIUM 600 PO) Take by mouth. 1 po daily       . Cholecalciferol (VITAMIN D) 1000 UNITS capsule Take 1,000 Units by mouth daily.        . clopidogrel (PLAVIX) 75 MG tablet TAKE ONE TABLET BY MOUTH ONE TIME DAILY  90 tablet  2  . furosemide (LASIX) 20 MG tablet Take 1 tablet (20 mg total) by mouth daily.  30 tablet  5  . isosorbide dinitrate (ISORDIL) 30 MG tablet TAKE 1 TABLET (30 MG TOTAL) BY MOUTH 2 (TWO) TIMES DAILY.  60 tablet  10  . LANTUS 100 UNIT/ML injection INJECT 10 UNITS AT BEDTIME  10 mL  5  . metFORMIN (GLUCOPHAGE) 1000 MG tablet TAKE ONE TABLET BY MOUTH TWICE DAILY  60 tablet  11  . Multiple Vitamin (MULTIVITAMIN) capsule Take 1 capsule by mouth daily.        Marland Kitchen  nitroGLYCERIN (NITROSTAT) 0.4 MG SL tablet Place 0.4 mg under the tongue every 5 (five) minutes as needed.        Marland Kitchen oxycodone (OXY-IR) 5 MG capsule Take 5 mg by mouth every 4 (four) hours as needed.        . pravastatin (PRAVACHOL) 40 MG tablet Take 1 tablet (40 mg total) by mouth daily.  30 tablet  4  . promethazine (PHENERGAN) 25 MG tablet Take 25 mg by mouth every 6 (six) hours as needed.        . ramipril (ALTACE) 10 MG capsule Take 1 capsule (10 mg total) by mouth daily.  30 capsule  3  . ranolazine (RANEXA) 500 MG 12 hr tablet Take 1 tablet (500 mg total) by mouth 2 (two) times daily.  60 tablet  12  . DISCONTD: metFORMIN (GLUCOPHAGE) 1000 MG tablet 1,000 mg 2 (two) times daily as needed. 1 bid. Pt needs to schedule a follow up appt before next refill.        Allergies  Alprazolam; Amoxicillin;  Cephalexin; Codeine; Codeine phosphate; Doxycycline; Hydrochlorothiazide; Morphine; Morphine sulfate; Niacin; and Omeprazole  Electrocardiogram:  NSR rate 67  LAD nonspecific ST/T wave changes  Assessment and Plan

## 2012-02-02 NOTE — Assessment & Plan Note (Signed)
ECG shows no new MI.  She has had a lot of pain over the years that is not cardiac.  Stable continue medical Rx

## 2012-02-02 NOTE — Assessment & Plan Note (Signed)
Well controlled.  Continue current medications and low sodium Dash type diet.    

## 2012-02-02 NOTE — Patient Instructions (Addendum)
Your physician wants you to follow-up in: 3 MONTHS.  You will receive a reminder letter in the mail two months in advance. If you don't receive a letter, please call our office to schedule the follow-up appointment.  Your physician recommends that you continue on your current medications as directed. Please refer to the Current Medication list given to you today.  

## 2012-02-02 NOTE — Assessment & Plan Note (Signed)
Discussed low carb diet.  Target hemoglobin A1c is 6.5 or less.  Continue current medications.  

## 2012-02-08 ENCOUNTER — Other Ambulatory Visit: Payer: Self-pay | Admitting: Family Medicine

## 2012-02-22 DIAGNOSIS — R0602 Shortness of breath: Secondary | ICD-10-CM | POA: Diagnosis not present

## 2012-02-22 DIAGNOSIS — R059 Cough, unspecified: Secondary | ICD-10-CM | POA: Diagnosis not present

## 2012-02-22 DIAGNOSIS — I2510 Atherosclerotic heart disease of native coronary artery without angina pectoris: Secondary | ICD-10-CM | POA: Diagnosis not present

## 2012-02-22 DIAGNOSIS — I209 Angina pectoris, unspecified: Secondary | ICD-10-CM | POA: Diagnosis not present

## 2012-02-22 DIAGNOSIS — R630 Anorexia: Secondary | ICD-10-CM | POA: Diagnosis not present

## 2012-03-19 ENCOUNTER — Telehealth: Payer: Self-pay | Admitting: Family Medicine

## 2012-03-19 NOTE — Telephone Encounter (Signed)
Caller: [name]/Care Kathleen Lime; Patient Name: Carla Friedman; PCP: Gershon Crane [facility]); Best Callback Phone Number: [phone]; Call regarding: Urinary Symptoms; onset 03/11/12; confusion, frequency, burning with urination; has had two UTI in past ten months; last appt 02/02/12; all emergent symptoms of Urinary Symptoms - Female protocol ruled out except "has one or more urinary tract symptoms and has not been previously evaluated"; disposition see within 24 hrs; recommended appt, but [name] said that due to pt condition and hospice care, Dr.Fry usually calls in a rx for Cipro to the [facility] on [address]

## 2012-03-20 MED ORDER — CIPROFLOXACIN HCL 500 MG PO TABS: 500.0000 mg | ORAL_TABLET | Freq: Two times a day (BID) | ORAL | Status: AC

## 2012-03-20 NOTE — Telephone Encounter (Signed)
Call in Cipro 500 mg bid for 10 days  

## 2012-03-20 NOTE — Telephone Encounter (Signed)
I sent script e-scribe and spoke with nurse.

## 2012-03-20 NOTE — Telephone Encounter (Signed)
Hospice called and said that the pt has uti. Need some Cipro called in to [facility] on [address].

## 2012-03-24 DIAGNOSIS — R0602 Shortness of breath: Secondary | ICD-10-CM | POA: Diagnosis not present

## 2012-03-24 DIAGNOSIS — R630 Anorexia: Secondary | ICD-10-CM | POA: Diagnosis not present

## 2012-03-24 DIAGNOSIS — I2510 Atherosclerotic heart disease of native coronary artery without angina pectoris: Secondary | ICD-10-CM | POA: Diagnosis not present

## 2012-03-24 DIAGNOSIS — I209 Angina pectoris, unspecified: Secondary | ICD-10-CM | POA: Diagnosis not present

## 2012-03-24 DIAGNOSIS — R059 Cough, unspecified: Secondary | ICD-10-CM | POA: Diagnosis not present

## 2012-04-17 DIAGNOSIS — Z23 Encounter for immunization: Secondary | ICD-10-CM | POA: Diagnosis not present

## 2012-04-23 DIAGNOSIS — I2510 Atherosclerotic heart disease of native coronary artery without angina pectoris: Secondary | ICD-10-CM | POA: Diagnosis not present

## 2012-04-23 DIAGNOSIS — R0602 Shortness of breath: Secondary | ICD-10-CM | POA: Diagnosis not present

## 2012-04-23 DIAGNOSIS — I209 Angina pectoris, unspecified: Secondary | ICD-10-CM | POA: Diagnosis not present

## 2012-04-23 DIAGNOSIS — R059 Cough, unspecified: Secondary | ICD-10-CM | POA: Diagnosis not present

## 2012-04-23 DIAGNOSIS — R630 Anorexia: Secondary | ICD-10-CM | POA: Diagnosis not present

## 2012-04-29 ENCOUNTER — Telehealth: Payer: Self-pay | Admitting: Family Medicine

## 2012-04-29 MED ORDER — CIPROFLOXACIN HCL 500 MG PO TABS: 500.0000 mg | ORAL_TABLET | Freq: Two times a day (BID) | ORAL | Status: DC

## 2012-04-29 NOTE — Telephone Encounter (Signed)
I sent in script and spoke with pt. 

## 2012-04-29 NOTE — Telephone Encounter (Signed)
[facility] called to check on status of getting Cipro. Pls call in asap.

## 2012-04-29 NOTE — Telephone Encounter (Signed)
Caller: Channelle/Patient; Patient Name: Carla Friedman; PCP: Gershon Crane [facility]); Best Callback Phone Number: [phone]; Call regarding Urinary buring and frequency, onset 04-25-12. Afebrile. All emergent symptoms ruled out per Urinary Symptoms protocol, see in 24 hours due to one or more UTI symptoms and not previously evaluted. Appointment offered.  Pt is in hospice care, lives at [facility], doesn't drive. Pt request,  Cipro 500mg , please be sent to [facility] by 1600 for Daughter to pick up.   [facility], [address], [phone].

## 2012-04-29 NOTE — Telephone Encounter (Signed)
Call in Cipro 500 mg bid for 7 days  

## 2012-05-01 ENCOUNTER — Ambulatory Visit: Admitting: Cardiovascular Disease

## 2012-05-11 ENCOUNTER — Other Ambulatory Visit: Payer: Self-pay | Admitting: Cardiovascular Disease

## 2012-05-14 ENCOUNTER — Ambulatory Visit: Admitting: Cardiovascular Disease

## 2012-05-24 DIAGNOSIS — I10 Essential (primary) hypertension: Secondary | ICD-10-CM | POA: Diagnosis not present

## 2012-05-24 DIAGNOSIS — I209 Angina pectoris, unspecified: Secondary | ICD-10-CM | POA: Diagnosis not present

## 2012-05-24 DIAGNOSIS — E119 Type 2 diabetes mellitus without complications: Secondary | ICD-10-CM | POA: Diagnosis not present

## 2012-05-24 DIAGNOSIS — R630 Anorexia: Secondary | ICD-10-CM | POA: Diagnosis not present

## 2012-05-24 DIAGNOSIS — I2510 Atherosclerotic heart disease of native coronary artery without angina pectoris: Secondary | ICD-10-CM | POA: Diagnosis not present

## 2012-05-24 DIAGNOSIS — R0602 Shortness of breath: Secondary | ICD-10-CM | POA: Diagnosis not present

## 2012-05-24 DIAGNOSIS — E785 Hyperlipidemia, unspecified: Secondary | ICD-10-CM | POA: Diagnosis not present

## 2012-05-24 DIAGNOSIS — R059 Cough, unspecified: Secondary | ICD-10-CM | POA: Diagnosis not present

## 2012-06-06 ENCOUNTER — Telehealth: Payer: Self-pay | Admitting: Family Medicine

## 2012-06-06 NOTE — Telephone Encounter (Signed)
Pt having neuropathic pain in hands and feet. Pt is on Amitriptyline. Can that be increased? You can contact [name] [facility] [place]) at [phone]

## 2012-06-07 ENCOUNTER — Ambulatory Visit: Admitting: Cardiovascular Disease

## 2012-06-07 MED ORDER — GABAPENTIN 100 MG PO CAPS: 100.0000 mg | ORAL_CAPSULE | Freq: Three times a day (TID) | ORAL | Status: DC

## 2012-06-07 NOTE — Telephone Encounter (Signed)
Med filled. Pt notified of the additional medication.

## 2012-06-07 NOTE — Telephone Encounter (Signed)
Actually keep the Amitriptyline as is but add Gabapentin 100 mg tid. Call in #90 with 11 rf

## 2012-06-18 ENCOUNTER — Other Ambulatory Visit: Payer: Self-pay | Admitting: Family Medicine

## 2012-06-23 DIAGNOSIS — I209 Angina pectoris, unspecified: Secondary | ICD-10-CM | POA: Diagnosis not present

## 2012-06-23 DIAGNOSIS — I2510 Atherosclerotic heart disease of native coronary artery without angina pectoris: Secondary | ICD-10-CM | POA: Diagnosis not present

## 2012-06-23 DIAGNOSIS — R05 Cough: Secondary | ICD-10-CM | POA: Diagnosis not present

## 2012-06-23 DIAGNOSIS — E785 Hyperlipidemia, unspecified: Secondary | ICD-10-CM | POA: Diagnosis not present

## 2012-06-23 DIAGNOSIS — R0602 Shortness of breath: Secondary | ICD-10-CM | POA: Diagnosis not present

## 2012-06-23 DIAGNOSIS — R630 Anorexia: Secondary | ICD-10-CM | POA: Diagnosis not present

## 2012-06-23 DIAGNOSIS — I10 Essential (primary) hypertension: Secondary | ICD-10-CM | POA: Diagnosis not present

## 2012-06-23 DIAGNOSIS — E119 Type 2 diabetes mellitus without complications: Secondary | ICD-10-CM | POA: Diagnosis not present

## 2012-06-26 DIAGNOSIS — I2510 Atherosclerotic heart disease of native coronary artery without angina pectoris: Secondary | ICD-10-CM | POA: Diagnosis not present

## 2012-06-26 DIAGNOSIS — R0602 Shortness of breath: Secondary | ICD-10-CM | POA: Diagnosis not present

## 2012-06-26 DIAGNOSIS — I10 Essential (primary) hypertension: Secondary | ICD-10-CM | POA: Diagnosis not present

## 2012-06-26 DIAGNOSIS — I209 Angina pectoris, unspecified: Secondary | ICD-10-CM | POA: Diagnosis not present

## 2012-06-26 DIAGNOSIS — R05 Cough: Secondary | ICD-10-CM | POA: Diagnosis not present

## 2012-06-26 DIAGNOSIS — R630 Anorexia: Secondary | ICD-10-CM | POA: Diagnosis not present

## 2012-06-27 DIAGNOSIS — I209 Angina pectoris, unspecified: Secondary | ICD-10-CM | POA: Diagnosis not present

## 2012-06-27 DIAGNOSIS — R05 Cough: Secondary | ICD-10-CM | POA: Diagnosis not present

## 2012-06-27 DIAGNOSIS — I10 Essential (primary) hypertension: Secondary | ICD-10-CM | POA: Diagnosis not present

## 2012-06-27 DIAGNOSIS — I2510 Atherosclerotic heart disease of native coronary artery without angina pectoris: Secondary | ICD-10-CM | POA: Diagnosis not present

## 2012-06-27 DIAGNOSIS — R0602 Shortness of breath: Secondary | ICD-10-CM | POA: Diagnosis not present

## 2012-06-27 DIAGNOSIS — R630 Anorexia: Secondary | ICD-10-CM | POA: Diagnosis not present

## 2012-07-01 DIAGNOSIS — R630 Anorexia: Secondary | ICD-10-CM | POA: Diagnosis not present

## 2012-07-01 DIAGNOSIS — I10 Essential (primary) hypertension: Secondary | ICD-10-CM | POA: Diagnosis not present

## 2012-07-01 DIAGNOSIS — I2510 Atherosclerotic heart disease of native coronary artery without angina pectoris: Secondary | ICD-10-CM | POA: Diagnosis not present

## 2012-07-01 DIAGNOSIS — R05 Cough: Secondary | ICD-10-CM | POA: Diagnosis not present

## 2012-07-01 DIAGNOSIS — R0602 Shortness of breath: Secondary | ICD-10-CM | POA: Diagnosis not present

## 2012-07-01 DIAGNOSIS — I209 Angina pectoris, unspecified: Secondary | ICD-10-CM | POA: Diagnosis not present

## 2012-07-03 DIAGNOSIS — R05 Cough: Secondary | ICD-10-CM | POA: Diagnosis not present

## 2012-07-03 DIAGNOSIS — R0602 Shortness of breath: Secondary | ICD-10-CM | POA: Diagnosis not present

## 2012-07-03 DIAGNOSIS — I2510 Atherosclerotic heart disease of native coronary artery without angina pectoris: Secondary | ICD-10-CM | POA: Diagnosis not present

## 2012-07-03 DIAGNOSIS — I10 Essential (primary) hypertension: Secondary | ICD-10-CM | POA: Diagnosis not present

## 2012-07-03 DIAGNOSIS — I209 Angina pectoris, unspecified: Secondary | ICD-10-CM | POA: Diagnosis not present

## 2012-07-03 DIAGNOSIS — R630 Anorexia: Secondary | ICD-10-CM | POA: Diagnosis not present

## 2012-07-08 DIAGNOSIS — I10 Essential (primary) hypertension: Secondary | ICD-10-CM | POA: Diagnosis not present

## 2012-07-08 DIAGNOSIS — R0602 Shortness of breath: Secondary | ICD-10-CM | POA: Diagnosis not present

## 2012-07-08 DIAGNOSIS — I2510 Atherosclerotic heart disease of native coronary artery without angina pectoris: Secondary | ICD-10-CM | POA: Diagnosis not present

## 2012-07-08 DIAGNOSIS — R630 Anorexia: Secondary | ICD-10-CM | POA: Diagnosis not present

## 2012-07-08 DIAGNOSIS — R05 Cough: Secondary | ICD-10-CM | POA: Diagnosis not present

## 2012-07-08 DIAGNOSIS — I209 Angina pectoris, unspecified: Secondary | ICD-10-CM | POA: Diagnosis not present

## 2012-07-10 ENCOUNTER — Other Ambulatory Visit: Payer: Self-pay | Admitting: Family Medicine

## 2012-07-10 ENCOUNTER — Telehealth: Payer: Self-pay | Admitting: Family Medicine

## 2012-07-10 DIAGNOSIS — R0789 Other chest pain: Secondary | ICD-10-CM

## 2012-07-10 DIAGNOSIS — R05 Cough: Secondary | ICD-10-CM | POA: Diagnosis not present

## 2012-07-10 DIAGNOSIS — I2510 Atherosclerotic heart disease of native coronary artery without angina pectoris: Secondary | ICD-10-CM | POA: Diagnosis not present

## 2012-07-10 DIAGNOSIS — I209 Angina pectoris, unspecified: Secondary | ICD-10-CM | POA: Diagnosis not present

## 2012-07-10 DIAGNOSIS — I10 Essential (primary) hypertension: Secondary | ICD-10-CM | POA: Diagnosis not present

## 2012-07-10 DIAGNOSIS — R630 Anorexia: Secondary | ICD-10-CM | POA: Diagnosis not present

## 2012-07-10 DIAGNOSIS — R0602 Shortness of breath: Secondary | ICD-10-CM | POA: Diagnosis not present

## 2012-07-10 NOTE — Telephone Encounter (Signed)
Patient's daughter called stating that she will run out of her LANTUS 100 UNIT/ML injection INJECT 10 UNITS AT BEDTIME and need it [facility] [address] and also ramipril (ALTACE) 10 MG capsule Take 1 capsule (10 mg total) by mouth daily. Daughter apologizes for short notice as mom is in a alf and this was overlooked. Please assist.

## 2012-07-11 MED ORDER — RAMIPRIL 10 MG PO CAPS: 10.0000 mg | ORAL_CAPSULE | Freq: Every day | ORAL | Status: DC

## 2012-07-11 MED ORDER — INSULIN GLARGINE 100 UNIT/ML ~~LOC~~ SOLN: 10.0000 [IU] | Freq: Every day | SUBCUTANEOUS | Status: DC

## 2012-07-11 NOTE — Telephone Encounter (Signed)
I sent both scripts e-scribe. 

## 2012-07-15 DIAGNOSIS — R630 Anorexia: Secondary | ICD-10-CM | POA: Diagnosis not present

## 2012-07-15 DIAGNOSIS — I10 Essential (primary) hypertension: Secondary | ICD-10-CM | POA: Diagnosis not present

## 2012-07-15 DIAGNOSIS — I2510 Atherosclerotic heart disease of native coronary artery without angina pectoris: Secondary | ICD-10-CM | POA: Diagnosis not present

## 2012-07-15 DIAGNOSIS — R05 Cough: Secondary | ICD-10-CM | POA: Diagnosis not present

## 2012-07-15 DIAGNOSIS — R0602 Shortness of breath: Secondary | ICD-10-CM | POA: Diagnosis not present

## 2012-07-15 DIAGNOSIS — I209 Angina pectoris, unspecified: Secondary | ICD-10-CM | POA: Diagnosis not present

## 2012-07-22 DIAGNOSIS — I209 Angina pectoris, unspecified: Secondary | ICD-10-CM | POA: Diagnosis not present

## 2012-07-22 DIAGNOSIS — I2510 Atherosclerotic heart disease of native coronary artery without angina pectoris: Secondary | ICD-10-CM | POA: Diagnosis not present

## 2012-07-22 DIAGNOSIS — R05 Cough: Secondary | ICD-10-CM | POA: Diagnosis not present

## 2012-07-22 DIAGNOSIS — R0602 Shortness of breath: Secondary | ICD-10-CM | POA: Diagnosis not present

## 2012-07-22 DIAGNOSIS — I10 Essential (primary) hypertension: Secondary | ICD-10-CM | POA: Diagnosis not present

## 2012-07-22 DIAGNOSIS — R630 Anorexia: Secondary | ICD-10-CM | POA: Diagnosis not present

## 2012-07-24 DIAGNOSIS — I2510 Atherosclerotic heart disease of native coronary artery without angina pectoris: Secondary | ICD-10-CM | POA: Diagnosis not present

## 2012-07-26 ENCOUNTER — Other Ambulatory Visit: Payer: Self-pay

## 2012-07-26 MED ORDER — PRAVASTATIN SODIUM 40 MG PO TABS: 40.0000 mg | ORAL_TABLET | Freq: Every day | ORAL | Status: DC

## 2012-07-29 DIAGNOSIS — I2510 Atherosclerotic heart disease of native coronary artery without angina pectoris: Secondary | ICD-10-CM | POA: Diagnosis not present

## 2012-07-30 DIAGNOSIS — I2510 Atherosclerotic heart disease of native coronary artery without angina pectoris: Secondary | ICD-10-CM | POA: Diagnosis not present

## 2012-07-31 DIAGNOSIS — I2510 Atherosclerotic heart disease of native coronary artery without angina pectoris: Secondary | ICD-10-CM | POA: Diagnosis not present

## 2012-08-05 DIAGNOSIS — I2510 Atherosclerotic heart disease of native coronary artery without angina pectoris: Secondary | ICD-10-CM | POA: Diagnosis not present

## 2012-08-06 DIAGNOSIS — I2510 Atherosclerotic heart disease of native coronary artery without angina pectoris: Secondary | ICD-10-CM | POA: Diagnosis not present

## 2012-08-07 ENCOUNTER — Ambulatory Visit (INDEPENDENT_AMBULATORY_CARE_PROVIDER_SITE_OTHER): Payer: Medicare Other | Admitting: Cardiovascular Disease

## 2012-08-07 VITALS — BP 126/76 | HR 67 | Resp 16 | Ht 64.0 in | Wt 161.5 lb

## 2012-08-07 DIAGNOSIS — E785 Hyperlipidemia, unspecified: Secondary | ICD-10-CM

## 2012-08-07 DIAGNOSIS — R413 Other amnesia: Secondary | ICD-10-CM | POA: Insufficient documentation

## 2012-08-07 DIAGNOSIS — I10 Essential (primary) hypertension: Secondary | ICD-10-CM

## 2012-08-07 DIAGNOSIS — I2510 Atherosclerotic heart disease of native coronary artery without angina pectoris: Secondary | ICD-10-CM | POA: Diagnosis not present

## 2012-08-07 NOTE — Patient Instructions (Addendum)
Your physician recommends that you schedule a follow-up appointment in: 3 months with Dr. Nishan 

## 2012-08-07 NOTE — Assessment & Plan Note (Signed)
Well controlled.  Continue current medications and low sodium Dash type diet.    

## 2012-08-07 NOTE — Assessment & Plan Note (Signed)
Cholesterol is at goal.  Continue current dose of statin and diet Rx.  No myalgias or side effects.  F/U  LFT's in 6 months. Lab Results  Component Value Date   LDLCALC  Value: 53        Total Cholesterol/HDL:CHD Risk Coronary Heart Disease Risk Table                     Men   Women  1/2 Average Risk   3.4   3.3  Average Risk       5.0   4.4  2 X Average Risk   9.6   7.1  3 X Average Risk  23.4   11.0        Use the calculated Patient Ratio above and the CHD Risk Table to determine the patient's CHD Risk.        ATP III CLASSIFICATION (LDL):  <100     mg/dL   Optimal  100-129  mg/dL   Near or Above                    Optimal  130-159  mg/dL   Borderline  160-189  mg/dL   High  >190     mg/dL   Very High 11/02/2008             

## 2012-08-07 NOTE — Assessment & Plan Note (Signed)
Stable with infrequent  angina and good activity level.  Continue medical Rx

## 2012-08-07 NOTE — Assessment & Plan Note (Signed)
This seems to have accelerated quite a bit.  Has safe supports at [facility] and daughter handling pills

## 2012-08-07 NOTE — Progress Notes (Signed)
Patient ID: Carla Friedman, female   DOB: 1933/10/23, 79 y.o.   MRN: [id number] Carla Friedman is seen today for CAD, HTN, and elevated lipids. She has had CABG with stents to the native LAD and SVG to OM. She has distal disease and chronic chest pain. She actually is seen by hospice. She prefers not to be seen in hospital alot. She has less chest pain off lisinopril and on Atenolol. . Her last cath was in 11/2006 and she had a non-ischemic myovue in 11/2007. She has morphine and oxygen at home. She has sig. depression. She looked very good today I am not convinced that all of her pains are anginal but she seems satisfied to have oxygen at home.  She also complains of SOB. it does not sound like volume there is no PND, orthopnea, or edema.  She continues to be depressed. She uses oxygen and nitro as a crutch but this has helped her  over the years.  She has significant venous insuf with marked dependant discoloration but no arterial insuf.  Ranexa has helped her quite a bit.  No at [employer]. Had 3 hours of pain last Wendsday and took 5 nitro. Discussed ability go to hospital and have cath if needed. But Carla Friedman has preferred conservative  Care over the last 5 years  Took two nitro today.  Increasingly forgetful    ROS: Denies fever, malais, weight loss, blurry vision, decreased visual acuity, cough, sputum, SOB, hemoptysis, pleuritic pain, palpitaitons, heartburn, abdominal pain, melena, lower extremity edema, claudication, or rash.  All other systems reviewed and negative  General: Affect appropriate Healthy:  appears stated age HEENT: normal Neck supple with no adenopathy JVP normal no bruits no thyromegaly Lungs clear with no wheezing and good diaphragmatic motion Heart:  S1/S2 no murmur, no rub, gallop or click PMI normal Abdomen: benighn, BS positve, no tenderness, no AAA no bruit.  No HSM or HJR Distal pulses intact with no bruits No edema Neuro non-focal Skin warm and dry No muscular  weakness   Current Outpatient Prescriptions  Medication Sig Dispense Refill  . amitriptyline (ELAVIL) 50 MG tablet TAKE ONE TABLET BY MOUTH AT BEDTIME  30 tablet  8  . Ascorbic Acid (VITAMIN C) 500 MG tablet Take 500 mg by mouth daily.        Marland Kitchen aspirin 81 MG tablet Take 81 mg by mouth daily.        Marland Kitchen atenolol (TENORMIN) 50 MG tablet TAKE ONE TABLET BY MOUTH ONE TIME DAILY  30 tablet  2  . Bee Pollen 500 MG TABS Take by mouth.        . Calcium Carbonate (CALCIUM 600 PO) Take by mouth. 1 po daily       . Cholecalciferol (VITAMIN D) 1000 UNITS capsule Take 1,000 Units by mouth daily.        . clopidogrel (PLAVIX) 75 MG tablet TAKE ONE TABLET BY MOUTH ONE TIME DAILY  90 tablet  2  . furosemide (LASIX) 20 MG tablet TAKE ONE TABLET BY MOUTH ONE TIME DAILY  30 tablet  3  . gabapentin (NEURONTIN) 100 MG capsule Take 1 capsule (100 mg total) by mouth 3 (three) times daily.  90 capsule  11  . insulin glargine (LANTUS) 100 UNIT/ML injection Inject 10 Units into the skin at bedtime.  10 mL  3  . isosorbide dinitrate (ISORDIL) 30 MG tablet TAKE 1 TABLET (30 MG TOTAL) BY MOUTH 2 (TWO) TIMES DAILY.  60 tablet  10  .  metFORMIN (GLUCOPHAGE) 1000 MG tablet TAKE ONE TABLET BY MOUTH TWICE DAILY  60 tablet  11  . Multiple Vitamin (MULTIVITAMIN) capsule Take 1 capsule by mouth daily.        . nitroGLYCERIN (NITROSTAT) 0.4 MG SL tablet Place 0.4 mg under the tongue every 5 (five) minutes as needed.        Marland Kitchen oxycodone (OXY-IR) 5 MG capsule Take 5 mg by mouth every 4 (four) hours as needed.        . pravastatin (PRAVACHOL) 40 MG tablet Take 1 tablet (40 mg total) by mouth daily.  30 tablet  1  . promethazine (PHENERGAN) 25 MG tablet Take 25 mg by mouth every 6 (six) hours as needed.        . ramipril (ALTACE) 10 MG capsule Take 1 capsule (10 mg total) by mouth daily.  30 capsule  3  . ranolazine (RANEXA) 500 MG 12 hr tablet Take 1 tablet (500 mg total) by mouth 2 (two) times daily.  60 tablet  12  . ciprofloxacin  (CIPRO) 500 MG tablet Take 1 tablet (500 mg total) by mouth 2 (two) times daily.  14 tablet  0    Allergies  Alprazolam; Amoxicillin; Cephalexin; Codeine; Codeine phosphate; Doxycycline; Hydrochlorothiazide; Morphine; Morphine sulfate; Niacin; and Omeprazole  Electrocardiogram:  SR rate 67  LAD nonspecific ST/T wave changes  Assessment and Plan

## 2012-08-12 DIAGNOSIS — I2510 Atherosclerotic heart disease of native coronary artery without angina pectoris: Secondary | ICD-10-CM | POA: Diagnosis not present

## 2012-08-14 DIAGNOSIS — I2510 Atherosclerotic heart disease of native coronary artery without angina pectoris: Secondary | ICD-10-CM | POA: Diagnosis not present

## 2012-08-19 DIAGNOSIS — I2510 Atherosclerotic heart disease of native coronary artery without angina pectoris: Secondary | ICD-10-CM | POA: Diagnosis not present

## 2012-08-21 DIAGNOSIS — I2510 Atherosclerotic heart disease of native coronary artery without angina pectoris: Secondary | ICD-10-CM | POA: Diagnosis not present

## 2012-08-23 ENCOUNTER — Other Ambulatory Visit: Payer: Self-pay | Admitting: *Deleted

## 2012-08-23 MED ORDER — ATENOLOL 50 MG PO TABS: ORAL_TABLET | ORAL | Status: DC

## 2012-08-24 DIAGNOSIS — I2510 Atherosclerotic heart disease of native coronary artery without angina pectoris: Secondary | ICD-10-CM | POA: Diagnosis not present

## 2012-09-20 ENCOUNTER — Other Ambulatory Visit: Payer: Self-pay | Admitting: Family Medicine

## 2012-09-21 DIAGNOSIS — I2510 Atherosclerotic heart disease of native coronary artery without angina pectoris: Secondary | ICD-10-CM | POA: Diagnosis not present

## 2012-10-02 ENCOUNTER — Other Ambulatory Visit: Payer: Self-pay | Admitting: *Deleted

## 2012-10-02 MED ORDER — CLOPIDOGREL BISULFATE 75 MG PO TABS: ORAL_TABLET | ORAL | Status: DC

## 2012-10-02 MED ORDER — PRAVASTATIN SODIUM 40 MG PO TABS: 40.0000 mg | ORAL_TABLET | Freq: Every day | ORAL | Status: DC

## 2012-10-08 ENCOUNTER — Telehealth: Payer: Self-pay | Admitting: Family Medicine

## 2012-10-08 NOTE — Telephone Encounter (Signed)
Patient Information:  Caller Name: Danford Bad  Phone: [phone]  Patient: Carla Friedman, Carla Friedman  Gender: Female  DOB: 11-29-1933  Age: 79 Years  PCP: Gershon Crane [facility])  Office Follow Up:  Does the office need to follow up with this patient?: No  Instructions For The Office: N/A  RN Note:  Patient tells her daughter she needs a visit with Dr. Clent Ridges for diabetes medications.  States she usually sees him annually in the first quarter of the year; has been in hospice care for > 3 years, and is told by the RNs there is no need to see PCP, but patient "believes she may yet be released from Hospice, and doesnt want to lose her doctor."  Blood sugars stable; states no need to triage, as "this is only for medication check up."  Last office visit with Dr. Clent Ridges was 07/2011; family states she is able to get around and uses her walker, and will call for appt with Dr. Clent Ridges.  Info to office.  krs/can  Symptoms  Reason For Call & Symptoms: diabetes; patient wonders if she is to come in for office visit  Reviewed Health History In EMR: Yes  Reviewed Medications In EMR: Yes  Reviewed Allergies In EMR: Yes  Reviewed Surgeries / Procedures: Yes  Date of Onset of Symptoms: Unknown  Guideline(s) Used:  No Protocol Available - Information Only  Disposition Per Guideline:   Home Care  Reason For Disposition Reached:   Information only question and nurse able to answer  Advice Given:  N/A  Patient Will Follow Care Advice:  YES

## 2012-10-16 DIAGNOSIS — L738 Other specified follicular disorders: Secondary | ICD-10-CM | POA: Diagnosis not present

## 2012-10-16 DIAGNOSIS — D485 Neoplasm of uncertain behavior of skin: Secondary | ICD-10-CM | POA: Diagnosis not present

## 2012-10-16 DIAGNOSIS — L821 Other seborrheic keratosis: Secondary | ICD-10-CM | POA: Diagnosis not present

## 2012-10-16 DIAGNOSIS — Z85828 Personal history of other malignant neoplasm of skin: Secondary | ICD-10-CM | POA: Diagnosis not present

## 2012-10-18 ENCOUNTER — Encounter: Payer: Self-pay | Admitting: Family Medicine

## 2012-10-18 ENCOUNTER — Ambulatory Visit (INDEPENDENT_AMBULATORY_CARE_PROVIDER_SITE_OTHER): Payer: Medicare Other | Admitting: Family Medicine

## 2012-10-18 VITALS — BP 120/70 | HR 73 | Temp 97.7°F | Ht 63.0 in | Wt 162.0 lb

## 2012-10-18 DIAGNOSIS — I2510 Atherosclerotic heart disease of native coronary artery without angina pectoris: Secondary | ICD-10-CM

## 2012-10-18 DIAGNOSIS — E119 Type 2 diabetes mellitus without complications: Secondary | ICD-10-CM | POA: Diagnosis not present

## 2012-10-18 DIAGNOSIS — K279 Peptic ulcer, site unspecified, unspecified as acute or chronic, without hemorrhage or perforation: Secondary | ICD-10-CM

## 2012-10-18 DIAGNOSIS — E785 Hyperlipidemia, unspecified: Secondary | ICD-10-CM

## 2012-10-18 DIAGNOSIS — I10 Essential (primary) hypertension: Secondary | ICD-10-CM | POA: Diagnosis not present

## 2012-10-18 DIAGNOSIS — F329 Major depressive disorder, single episode, unspecified: Secondary | ICD-10-CM

## 2012-10-18 DIAGNOSIS — R413 Other amnesia: Secondary | ICD-10-CM | POA: Diagnosis not present

## 2012-10-18 LAB — BASIC METABOLIC PANEL
CO2: 27 mEq/L (ref 19–32)
Calcium: 9 mg/dL (ref 8.4–10.5)
Chloride: 101 mEq/L (ref 96–112)
Creatinine, Ser: 1.1 mg/dL (ref 0.4–1.2)
Glucose, Bld: 121 mg/dL — ABNORMAL HIGH (ref 70–99)

## 2012-10-18 LAB — HEPATIC FUNCTION PANEL
ALT: 18 U/L (ref 0–35)
Albumin: 3.7 g/dL (ref 3.5–5.2)
Alkaline Phosphatase: 48 U/L (ref 39–117)
Bilirubin, Direct: 0.1 mg/dL (ref 0.0–0.3)
Total Protein: 6.6 g/dL (ref 6.0–8.3)

## 2012-10-18 LAB — CBC WITH DIFFERENTIAL/PLATELET
Basophils Relative: 0.4 % (ref 0.0–3.0)
Eosinophils Absolute: 0.4 10*3/uL (ref 0.0–0.7)
Lymphocytes Relative: 37.2 % (ref 12.0–46.0)
MCHC: 34.4 g/dL (ref 30.0–36.0)
MCV: 89.3 fl (ref 78.0–100.0)
Monocytes Absolute: 0.5 10*3/uL (ref 0.1–1.0)
Neutrophils Relative %: 50.1 % (ref 43.0–77.0)
Platelets: 270 10*3/uL (ref 150.0–400.0)
RBC: 3.97 Mil/uL (ref 3.87–5.11)
WBC: 7 10*3/uL (ref 4.5–10.5)

## 2012-10-18 LAB — LDL CHOLESTEROL, DIRECT: Direct LDL: 91.6 mg/dL

## 2012-10-18 LAB — HEMOGLOBIN A1C: Hgb A1c MFr Bld: 6.6 % — ABNORMAL HIGH (ref 4.6–6.5)

## 2012-10-18 LAB — LIPID PANEL
Cholesterol: 180 mg/dL (ref 0–200)
Triglycerides: 264 mg/dL — ABNORMAL HIGH (ref 0.0–149.0)

## 2012-10-18 MED ORDER — METFORMIN HCL 1000 MG PO TABS: 1000.0000 mg | ORAL_TABLET | Freq: Every day | ORAL | Status: DC

## 2012-10-18 NOTE — Progress Notes (Signed)
  Subjective:    Patient ID: Carla Friedman, female    DOB: [birth year], 79 y.o.   MRN: [id number]  HPI 79 yr old female with her daughter for a cpx. She is doing very well all in all. She has been living at [place] this past year, and she is very happy there. She is involved in a bridge club and she socializes every day. Her appetite is good and her moods have been good. She saw Dr. Eden Emms 2 months ago and he was pleased. She has frequent spells of mild chest pain every day and this is eased to some extent with nitroglycerin. She sleeps with oxygen every night but never uses this during the daytime.    Review of Systems  Constitutional: Negative.   HENT: Negative.   Eyes: Negative.   Respiratory: Negative.   Cardiovascular: Negative.   Gastrointestinal: Negative.   Genitourinary: Negative for dysuria, urgency, frequency, hematuria, flank pain, decreased urine volume, enuresis, difficulty urinating, pelvic pain and dyspareunia.  Musculoskeletal: Negative.   Skin: Negative.   Neurological: Negative.   Psychiatric/Behavioral: Negative.        Objective:   Physical Exam  Constitutional: She is oriented to person, place, and time. She appears well-developed and well-nourished. No distress.  She looks great   HENT:  Head: Normocephalic and atraumatic.  Right Ear: External ear normal.  Left Ear: External ear normal.  Nose: Nose normal.  Mouth/Throat: Oropharynx is clear and moist. No oropharyngeal exudate.  Eyes: Conjunctivae and EOM are normal. Pupils are equal, round, and reactive to light. No scleral icterus.  Neck: Normal range of motion. Neck supple. No JVD present. No thyromegaly present.  Cardiovascular: Normal rate, regular rhythm, normal heart sounds and intact distal pulses.  Exam reveals no gallop and no friction rub.   No murmur heard. Pulmonary/Chest: Effort normal and breath sounds normal. No respiratory distress. She has no wheezes. She has no rales. She exhibits  no tenderness.  Abdominal: Soft. Bowel sounds are normal. She exhibits no distension and no mass. There is no tenderness. There is no rebound and no guarding.  Musculoskeletal: Normal range of motion. She exhibits no edema and no tenderness.  Lymphadenopathy:    She has no cervical adenopathy.  Neurological: She is alert and oriented to person, place, and time. She has normal reflexes. No cranial nerve deficit. She exhibits normal muscle tone. Coordination normal.  Skin: Skin is warm and dry. No rash noted. No erythema.  Psychiatric: She has a normal mood and affect. Her behavior is normal. Judgment and thought content normal.  Bright affect, smiling           Assessment & Plan:  Well exam. Get fasting labs

## 2012-10-21 NOTE — Progress Notes (Signed)
Quick Note:  I spoke with pt and also put a copy of labs in mail. ______

## 2012-10-22 DIAGNOSIS — I2510 Atherosclerotic heart disease of native coronary artery without angina pectoris: Secondary | ICD-10-CM | POA: Diagnosis not present

## 2012-10-23 DIAGNOSIS — I2510 Atherosclerotic heart disease of native coronary artery without angina pectoris: Secondary | ICD-10-CM | POA: Diagnosis not present

## 2012-10-28 DIAGNOSIS — I2510 Atherosclerotic heart disease of native coronary artery without angina pectoris: Secondary | ICD-10-CM | POA: Diagnosis not present

## 2012-10-29 DIAGNOSIS — I2510 Atherosclerotic heart disease of native coronary artery without angina pectoris: Secondary | ICD-10-CM | POA: Diagnosis not present

## 2012-11-04 DIAGNOSIS — I2510 Atherosclerotic heart disease of native coronary artery without angina pectoris: Secondary | ICD-10-CM | POA: Diagnosis not present

## 2012-11-05 DIAGNOSIS — I2510 Atherosclerotic heart disease of native coronary artery without angina pectoris: Secondary | ICD-10-CM | POA: Diagnosis not present

## 2012-11-05 DIAGNOSIS — D0470 Carcinoma in situ of skin of unspecified lower limb, including hip: Secondary | ICD-10-CM | POA: Diagnosis not present

## 2012-11-05 DIAGNOSIS — D485 Neoplasm of uncertain behavior of skin: Secondary | ICD-10-CM | POA: Diagnosis not present

## 2012-11-05 DIAGNOSIS — C4020 Malignant neoplasm of long bones of unspecified lower limb: Secondary | ICD-10-CM | POA: Diagnosis not present

## 2012-11-05 DIAGNOSIS — Z85828 Personal history of other malignant neoplasm of skin: Secondary | ICD-10-CM | POA: Diagnosis not present

## 2012-11-11 DIAGNOSIS — I2510 Atherosclerotic heart disease of native coronary artery without angina pectoris: Secondary | ICD-10-CM | POA: Diagnosis not present

## 2012-11-12 DIAGNOSIS — I2510 Atherosclerotic heart disease of native coronary artery without angina pectoris: Secondary | ICD-10-CM | POA: Diagnosis not present

## 2012-11-18 ENCOUNTER — Ambulatory Visit (INDEPENDENT_AMBULATORY_CARE_PROVIDER_SITE_OTHER): Payer: Medicare Other | Admitting: Cardiovascular Disease

## 2012-11-18 ENCOUNTER — Encounter: Payer: Self-pay | Admitting: Cardiovascular Disease

## 2012-11-18 VITALS — BP 126/73 | HR 73 | Ht 64.0 in | Wt 158.0 lb

## 2012-11-18 DIAGNOSIS — R413 Other amnesia: Secondary | ICD-10-CM | POA: Diagnosis not present

## 2012-11-18 DIAGNOSIS — I10 Essential (primary) hypertension: Secondary | ICD-10-CM | POA: Diagnosis not present

## 2012-11-18 DIAGNOSIS — I2510 Atherosclerotic heart disease of native coronary artery without angina pectoris: Secondary | ICD-10-CM

## 2012-11-18 DIAGNOSIS — C4490 Unspecified malignant neoplasm of skin, unspecified: Secondary | ICD-10-CM

## 2012-11-18 DIAGNOSIS — E785 Hyperlipidemia, unspecified: Secondary | ICD-10-CM

## 2012-11-18 NOTE — Assessment & Plan Note (Signed)
Stable with no angina and good activity level.  Continue medical Rx  

## 2012-11-18 NOTE — Progress Notes (Signed)
Patient ID: Carla Friedman, female   DOB: 05-Jun-1934, 79 y.o.   MRN: [id number] Carla Friedman is seen today for CAD, HTN, and elevated lipids. She has had CABG with stents to the native LAD and SVG to OM. She has distal disease and chronic chest pain. She actually is seen by hospice. She prefers not to be seen in hospital alot. She has less chest pain off lisinopril and on Atenolol. . Her last cath was in 11/2006 and she had a non-ischemic myovue in 11/2007. She has morphine and oxygen at home. She has sig. depression. She looked very good today I am not convinced that all of her pains are anginal but she seems satisfied to have oxygen at home.  She also complains of SOB. it does not sound like volume there is no PND, orthopnea, or edema.  She continues to be depressed. She uses oxygen and nitro as a crutch but this has helped her  over the years.  She has significant venous insuf with marked dependant discoloration but no arterial insuf.  Ranexa has helped her quite a bit.  No at [employer]. Had 3 hours of pain last Wendsday and took 5 nitro. Discussed ability go to hospital and have cath if needed. But Carla Friedman has preferred conservative  Care over the last 5 years  Took two nitro today. Increasingly forgetful   ROS: Denies fever, malais, weight loss, blurry vision, decreased visual acuity, cough, sputum, SOB, hemoptysis, pleuritic pain, palpitaitons, heartburn, abdominal pain, melena, lower extremity edema, claudication, or rash.  All other systems reviewed and negative  General: Affect appropriate Healthy:  appears stated age HEENT: normal Neck supple with no adenopathy JVP normal no bruits no thyromegaly Lungs clear with no wheezing and good diaphragmatic motion Heart:  S1/S2 no murmur, no rub, gallop or click PMI normal Abdomen: benighn, BS positve, no tenderness, no AAA no bruit.  No HSM or HJR Distal pulses intact with no bruits No edema Neuro non-focal Skin some cancer lesions on LLE No  muscular weakness   Current Outpatient Prescriptions  Medication Sig Dispense Refill  . amitriptyline (ELAVIL) 50 MG tablet TAKE ONE TABLET BY MOUTH AT BEDTIME  30 tablet  8  . Ascorbic Acid (VITAMIN C) 500 MG tablet Take 500 mg by mouth daily.        Marland Kitchen aspirin 81 MG tablet Take 81 mg by mouth daily.        Marland Kitchen atenolol (TENORMIN) 50 MG tablet TAKE ONE TABLET BY MOUTH ONE TIME DAILY  30 tablet  2  . Bee Pollen 500 MG TABS Take by mouth.        . Calcium Carbonate (CALCIUM 600 PO) Take by mouth. 1 po daily       . Cholecalciferol (VITAMIN D) 1000 UNITS capsule Take 1,000 Units by mouth daily.        . clopidogrel (PLAVIX) 75 MG tablet TAKE ONE TABLET BY MOUTH ONE TIME DAILY  90 tablet  2  . furosemide (LASIX) 20 MG tablet TAKE ONE TABLET BY MOUTH ONE TIME DAILY  30 tablet  3  . gabapentin (NEURONTIN) 100 MG capsule Take 100 mg by mouth 2 (two) times daily.      . insulin glargine (LANTUS) 100 UNIT/ML injection Inject 10 Units into the skin at bedtime.  10 mL  3  . isosorbide dinitrate (ISORDIL) 30 MG tablet TAKE 1 TABLET (30 MG TOTAL) BY MOUTH 2 (TWO) TIMES DAILY.  60 tablet  10  . metFORMIN (GLUCOPHAGE)  1000 MG tablet Take 1 tablet (1,000 mg total) by mouth daily with breakfast.  30 tablet  0  . Multiple Vitamin (MULTIVITAMIN) capsule Take 1 capsule by mouth daily.        . nitroGLYCERIN (NITROSTAT) 0.4 MG SL tablet Place 0.4 mg under the tongue every 5 (five) minutes as needed.        Marland Kitchen oxycodone (OXY-IR) 5 MG capsule Take 5 mg by mouth every 4 (four) hours as needed.        . pravastatin (PRAVACHOL) 40 MG tablet Take 1 tablet (40 mg total) by mouth daily.  30 tablet  12  . promethazine (PHENERGAN) 25 MG tablet Take 25 mg by mouth every 6 (six) hours as needed.        . ramipril (ALTACE) 10 MG capsule Take 1 capsule (10 mg total) by mouth daily.  30 capsule  3  . ranolazine (RANEXA) 500 MG 12 hr tablet Take 1 tablet (500 mg total) by mouth 2 (two) times daily.  60 tablet  12  . desonide  (DESOWEN) 0.05 % cream As needed       No current facility-administered medications for this visit.    Allergies  Alprazolam; Amoxicillin; Cephalexin; Codeine; Codeine phosphate; Doxycycline; Hydrochlorothiazide; Morphine; Morphine sulfate; Niacin; and Omeprazole  Electrocardiogram:  08/07/12 SR rate 67 LAD otherwise normal  Assessment and Plan

## 2012-11-18 NOTE — Assessment & Plan Note (Signed)
Cholesterol is at goal.  Continue current dose of statin and diet Rx.  No myalgias or side effects.  F/U  LFT's in 6 months. Lab Results  Component Value Date   LDLCALC  Value: 53        Total Cholesterol/HDL:CHD Risk Coronary Heart Disease Risk Table                     Men   Women  1/2 Average Risk   3.4   3.3  Average Risk       5.0   4.4  2 X Average Risk   9.6   7.1  3 X Average Risk  23.4   11.0        Use the calculated Patient Ratio above and the CHD Risk Table to determine the patient's CHD Risk.        ATP III CLASSIFICATION (LDL):  <100     mg/dL   Optimal  100-129  mg/dL   Near or Above                    Optimal  130-159  mg/dL   Borderline  160-189  mg/dL   High  >190     mg/dL   Very High 11/02/2008             

## 2012-11-18 NOTE — Assessment & Plan Note (Signed)
Well controlled.  Continue current medications and low sodium Dash type diet.    

## 2012-11-18 NOTE — Patient Instructions (Signed)
Your physician wants you to follow-up in:  6 MONTHS WITH DR NISHAN  You will receive a reminder letter in the mail two months in advance. If you don't receive a letter, please call our office to schedule the follow-up appointment. Your physician recommends that you continue on your current medications as directed. Please refer to the Current Medication list given to you today. 

## 2012-11-18 NOTE — Assessment & Plan Note (Signed)
Progressive and noticeable on exam now

## 2012-11-18 NOTE — Assessment & Plan Note (Signed)
Ok to have moews procedure at [facility] Some venous disease in LE but should not be an issue with healing such a small area

## 2012-11-19 DIAGNOSIS — I2510 Atherosclerotic heart disease of native coronary artery without angina pectoris: Secondary | ICD-10-CM | POA: Diagnosis not present

## 2012-11-21 DIAGNOSIS — I2510 Atherosclerotic heart disease of native coronary artery without angina pectoris: Secondary | ICD-10-CM | POA: Diagnosis not present

## 2012-12-14 ENCOUNTER — Other Ambulatory Visit: Payer: Self-pay | Admitting: Family Medicine

## 2012-12-22 DIAGNOSIS — I2510 Atherosclerotic heart disease of native coronary artery without angina pectoris: Secondary | ICD-10-CM | POA: Diagnosis not present

## 2012-12-23 ENCOUNTER — Other Ambulatory Visit: Payer: Self-pay | Admitting: Family Medicine

## 2012-12-27 ENCOUNTER — Telehealth: Payer: Self-pay | Admitting: Cardiovascular Disease

## 2012-12-27 MED ORDER — ISOSORBIDE DINITRATE 30 MG PO TABS: ORAL_TABLET | ORAL | Status: DC

## 2012-12-27 NOTE — Telephone Encounter (Signed)
New Problem:    Called in needing a refill of her mother's isosorbide dinitrate (ISORDIL) 30 MG tablet.

## 2012-12-27 NOTE — Telephone Encounter (Signed)
Refill sent to pharmacy.   

## 2013-01-13 ENCOUNTER — Other Ambulatory Visit: Payer: Self-pay | Admitting: Family Medicine

## 2013-01-21 DIAGNOSIS — I2510 Atherosclerotic heart disease of native coronary artery without angina pectoris: Secondary | ICD-10-CM | POA: Diagnosis not present

## 2013-02-10 ENCOUNTER — Other Ambulatory Visit: Payer: Self-pay | Admitting: Family Medicine

## 2013-02-21 ENCOUNTER — Other Ambulatory Visit: Payer: Self-pay | Admitting: *Deleted

## 2013-02-21 ENCOUNTER — Telehealth: Payer: Self-pay | Admitting: Family Medicine

## 2013-02-21 DIAGNOSIS — I2510 Atherosclerotic heart disease of native coronary artery without angina pectoris: Secondary | ICD-10-CM | POA: Diagnosis not present

## 2013-02-21 MED ORDER — RANOLAZINE ER 500 MG PO TB12: 500.0000 mg | ORAL_TABLET | Freq: Two times a day (BID) | ORAL | Status: DC

## 2013-02-21 NOTE — Telephone Encounter (Signed)
Refill both for one year. 

## 2013-02-21 NOTE — Telephone Encounter (Signed)
Refill request for Amitriptyline 50 mg & Altace 10 mg and send to [facility] pharmacy.

## 2013-02-24 MED ORDER — RAMIPRIL 10 MG PO CAPS: 10.0000 mg | ORAL_CAPSULE | Freq: Every day | ORAL | Status: DC

## 2013-02-24 MED ORDER — AMITRIPTYLINE HCL 50 MG PO TABS: 50.0000 mg | ORAL_TABLET | Freq: Every day | ORAL | Status: DC

## 2013-02-24 NOTE — Telephone Encounter (Signed)
I sent both scripts e-scribe. 

## 2013-03-24 DIAGNOSIS — I2510 Atherosclerotic heart disease of native coronary artery without angina pectoris: Secondary | ICD-10-CM | POA: Diagnosis not present

## 2013-03-26 ENCOUNTER — Ambulatory Visit (INDEPENDENT_AMBULATORY_CARE_PROVIDER_SITE_OTHER): Payer: Medicare Other | Admitting: Family Medicine

## 2013-03-26 ENCOUNTER — Telehealth: Payer: Self-pay | Admitting: Family Medicine

## 2013-03-26 ENCOUNTER — Encounter: Payer: Self-pay | Admitting: Family Medicine

## 2013-03-26 VITALS — BP 110/64 | HR 77 | Temp 97.8°F | Wt 158.0 lb

## 2013-03-26 DIAGNOSIS — M25569 Pain in unspecified knee: Secondary | ICD-10-CM | POA: Diagnosis not present

## 2013-03-26 DIAGNOSIS — M25562 Pain in left knee: Secondary | ICD-10-CM

## 2013-03-26 DIAGNOSIS — Z23 Encounter for immunization: Secondary | ICD-10-CM

## 2013-03-26 MED ORDER — DICLOFENAC SODIUM 75 MG PO TBEC: 75.0000 mg | DELAYED_RELEASE_TABLET | Freq: Two times a day (BID) | ORAL | Status: DC

## 2013-03-26 NOTE — Telephone Encounter (Signed)
Per Dr. Clent Ridges, he is aware of the possible drug interaction and thinks it will be fine. I spoke with pharmacy and gave this information.

## 2013-03-26 NOTE — Telephone Encounter (Addendum)
Flag has come up for possible drug interaction w/diclofenac (VOLTAREN) 75 MG EC tablet and metFORMIN (GLUCOPHAGE) 1000 MG tablet. Pt is waiting at pharm. pls advise.

## 2013-03-26 NOTE — Progress Notes (Signed)
  Subjective:    Patient ID: Carla Friedman, female    DOB: [birth year], 79 y.o.   MRN: [id number]  HPI Here for 2 weeks of pain in the left knee. No recent trauma. Most of the pain is in the lateral knee. No swelling. No locking. Aleve helps somewhat.    Review of Systems  Constitutional: Negative.   Musculoskeletal: Positive for arthralgias. Negative for joint swelling.       Objective:   Physical Exam  Constitutional: She appears well-developed and well-nourished.  Musculoskeletal:  The left knee is tender in both joint spaces, lateral more than medial. Full ROM. No crepitus. No swelling.           Assessment & Plan:  Arthritis. Try Diclofenac instead of Aleve. Wrap with a hot towel prn.

## 2013-04-18 ENCOUNTER — Other Ambulatory Visit: Payer: Self-pay | Admitting: Cardiovascular Disease

## 2013-04-21 ENCOUNTER — Other Ambulatory Visit: Payer: Self-pay | Admitting: Family Medicine

## 2013-04-23 DIAGNOSIS — I2510 Atherosclerotic heart disease of native coronary artery without angina pectoris: Secondary | ICD-10-CM | POA: Diagnosis not present

## 2013-05-14 ENCOUNTER — Other Ambulatory Visit: Payer: Self-pay | Admitting: Family Medicine

## 2013-05-21 ENCOUNTER — Encounter: Payer: Self-pay | Admitting: Cardiovascular Disease

## 2013-05-21 ENCOUNTER — Ambulatory Visit (INDEPENDENT_AMBULATORY_CARE_PROVIDER_SITE_OTHER): Payer: Medicare Other | Admitting: Cardiovascular Disease

## 2013-05-21 VITALS — BP 132/62 | HR 84 | Ht 64.0 in | Wt 150.0 lb

## 2013-05-21 DIAGNOSIS — I2510 Atherosclerotic heart disease of native coronary artery without angina pectoris: Secondary | ICD-10-CM

## 2013-05-21 DIAGNOSIS — I10 Essential (primary) hypertension: Secondary | ICD-10-CM | POA: Diagnosis not present

## 2013-05-21 DIAGNOSIS — E785 Hyperlipidemia, unspecified: Secondary | ICD-10-CM | POA: Diagnosis not present

## 2013-05-21 DIAGNOSIS — C4490 Unspecified malignant neoplasm of skin, unspecified: Secondary | ICD-10-CM | POA: Diagnosis not present

## 2013-05-21 NOTE — Progress Notes (Signed)
Patient ID: Carla Friedman, female   DOB: [birth year], 79 y.o.   MRN: [id number] Dorita is seen today for CAD, HTN, and elevated lipids. She has had CABG with stents to the native LAD and SVG to OM. She has distal disease and chronic chest pain. She actually is seen by hospice. She prefers not to be seen in hospital alot. She has less chest pain off lisinopril and on Atenolol. . Her last cath was in 11/2006 and she had a non-ischemic myovue in 11/2007. She has morphine and oxygen at home. She has sig. depression. She looked very good today I am not convinced that all of her pains are anginal but she seems satisfied to have oxygen at home.  She also complains of SOB. it does not sound like volume there is no PND, orthopnea, or edema.  She continues to be depressed. She uses oxygen and nitro as a crutch but this has helped her  over the years.  She has significant venous insuf with marked dependant discoloration but no arterial insuf.  Ranexa has helped her quite a bit.   Still takes lots of nitro.  She and her daughter have wanted to stay with hospice and not pursue cath or aggressive Rx Cognitive function failing more       ROS: Denies fever, malais, weight loss, blurry vision, decreased visual acuity, cough, sputum, SOB, hemoptysis, pleuritic pain, palpitaitons, heartburn, abdominal pain, melena, lower extremity edema, claudication, or rash.  All other systems reviewed and negative  General: Affect appropriate Frail elderly female HEENT: normal Neck supple with no adenopathy JVP normal no bruits no thyromegaly Lungs clear with no wheezing and good diaphragmatic motion Heart:  S1/S2 no murmur, no rub, gallop or click PMI normal Abdomen: benighn, BS positve, no tenderness, no AAA no bruit.  No HSM or HJR Distal pulses intact with no bruits Trace edema with venous insuf.  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 amitriptyline (ELAVIL) 50 MG tablet Take 1 tablet (50 mg total) by mouth at bedtime.  30 tablet  11  . Ascorbic Acid (VITAMIN C) 500 MG tablet Take 500 mg by mouth daily.        Marland Kitchen aspirin 81 MG tablet Take 81 mg by mouth daily.        Marland Kitchen atenolol (TENORMIN) 50 MG tablet Take one tablet by mouth one time daily  30 tablet  1  . Bee Pollen 500 MG TABS Take by mouth.        . Calcium Carbonate (CALCIUM 600 PO) Take by mouth. 1 po daily       . Cholecalciferol (VITAMIN D) 1000 UNITS capsule Take 1,000 Units by mouth daily.        . clopidogrel (PLAVIX) 75 MG tablet TAKE ONE TABLET BY MOUTH ONE TIME DAILY  90 tablet  2  . desonide (DESOWEN) 0.05 % cream As needed      . diclofenac (VOLTAREN) 75 MG EC tablet Take 1 tablet (75 mg total) by mouth 2 (two) times daily.  60 tablet  5  . furosemide (LASIX) 20 MG tablet TAKE ONE TABLET BY MOUTH ONE TIME DAILY   30 tablet  6  . gabapentin (NEURONTIN) 100 MG capsule Take 100 mg by mouth 2 (two) times daily.      . isosorbide dinitrate (ISORDIL) 30 MG tablet TAKE 1 TABLET (  30 MG TOTAL) BY MOUTH 2 (TWO) TIMES DAILY.  60 tablet  11  . LANTUS 100 UNIT/ML injection Inject 10 units into the skin at bedtime.  10 mL  3  . metFORMIN (GLUCOPHAGE) 1000 MG tablet Take 1,000 mg by mouth 2 (two) times daily as needed.      . Multiple Vitamin (MULTIVITAMIN) capsule Take 1 capsule by mouth daily.        . nitroGLYCERIN (NITROSTAT) 0.4 MG SL tablet Place 0.4 mg under the tongue every 5 (five) minutes as needed.        Marland Kitchen oxycodone (OXY-IR) 5 MG capsule Take 5 mg by mouth every 4 (four) hours as needed.        . pravastatin (PRAVACHOL) 40 MG tablet Take 1 tablet (40 mg total) by mouth daily.  30 tablet  12  . promethazine (PHENERGAN) 25 MG tablet Take 25 mg by mouth every 6 (six) hours as needed.        . ramipril (ALTACE) 10 MG capsule Take 1 capsule (10 mg total) by mouth daily.  30 capsule  11  . ranolazine (RANEXA)  500 MG 12 hr tablet Take 1 tablet (500 mg total) by mouth 2 (two) times daily.  60 tablet  5   No current facility-administered medications for this visit.    Allergies  Alprazolam; Amoxicillin; Cephalexin; Codeine; Codeine phosphate; Doxycycline; Hydrochlorothiazide; Morphine; Morphine sulfate; Niacin; and Omeprazole  Electrocardiogram: 1/14  SR rate 67 LAD no other abnormalities   Assessment and Plan

## 2013-05-21 NOTE — Assessment & Plan Note (Signed)
Despite her venous insuf.  I think she would heal a [facility] procedure to remove lesion from right shin.  Daughter will call if she wants  A referral to [facility]

## 2013-05-21 NOTE — Assessment & Plan Note (Signed)
Well controlled.  Continue current medications and low sodium Dash type diet.    

## 2013-05-21 NOTE — Assessment & Plan Note (Signed)
Stable with no angina and good activity level.  Continue medical Rx Does not want heart cath Content with taking nitro when needed Samples of Ranexa given.  To continue to talk to [facility] Regarding hospice status

## 2013-05-21 NOTE — Patient Instructions (Signed)
Your physician wants you to follow-up in:  6 MONTHS WITH DR NISHAN  You will receive a reminder letter in the mail two months in advance. If you don't receive a letter, please call our office to schedule the follow-up appointment. Your physician recommends that you continue on your current medications as directed. Please refer to the Current Medication list given to you today. 

## 2013-05-24 DIAGNOSIS — I2510 Atherosclerotic heart disease of native coronary artery without angina pectoris: Secondary | ICD-10-CM | POA: Diagnosis not present

## 2013-05-30 DIAGNOSIS — N390 Urinary tract infection, site not specified: Secondary | ICD-10-CM | POA: Diagnosis not present

## 2013-06-21 ENCOUNTER — Other Ambulatory Visit: Payer: Self-pay | Admitting: Internal Medicine

## 2013-06-23 ENCOUNTER — Other Ambulatory Visit: Payer: Self-pay | Admitting: Cardiovascular Disease

## 2013-06-23 DIAGNOSIS — I2510 Atherosclerotic heart disease of native coronary artery without angina pectoris: Secondary | ICD-10-CM | POA: Diagnosis not present

## 2013-07-24 DIAGNOSIS — I2510 Atherosclerotic heart disease of native coronary artery without angina pectoris: Secondary | ICD-10-CM | POA: Diagnosis not present

## 2013-08-23 ENCOUNTER — Other Ambulatory Visit: Payer: Self-pay | Admitting: Cardiovascular Disease

## 2013-08-24 DIAGNOSIS — I2510 Atherosclerotic heart disease of native coronary artery without angina pectoris: Secondary | ICD-10-CM | POA: Diagnosis not present

## 2013-09-01 ENCOUNTER — Encounter: Payer: Self-pay | Admitting: Family Medicine

## 2013-09-01 ENCOUNTER — Ambulatory Visit (INDEPENDENT_AMBULATORY_CARE_PROVIDER_SITE_OTHER): Payer: Medicare Other | Admitting: Family Medicine

## 2013-09-01 VITALS — BP 130/68 | HR 76 | Temp 98.4°F | Ht 64.0 in | Wt 154.0 lb

## 2013-09-01 DIAGNOSIS — I2510 Atherosclerotic heart disease of native coronary artery without angina pectoris: Secondary | ICD-10-CM

## 2013-09-01 DIAGNOSIS — I10 Essential (primary) hypertension: Secondary | ICD-10-CM | POA: Diagnosis not present

## 2013-09-01 DIAGNOSIS — Z23 Encounter for immunization: Secondary | ICD-10-CM | POA: Diagnosis not present

## 2013-09-01 DIAGNOSIS — E119 Type 2 diabetes mellitus without complications: Secondary | ICD-10-CM

## 2013-09-01 DIAGNOSIS — R413 Other amnesia: Secondary | ICD-10-CM

## 2013-09-01 LAB — HEMOGLOBIN A1C: HEMOGLOBIN A1C: 6 % (ref 4.6–6.5)

## 2013-09-01 LAB — BASIC METABOLIC PANEL
BUN: 13 mg/dL (ref 6–23)
CALCIUM: 9.1 mg/dL (ref 8.4–10.5)
CO2: 27 mEq/L (ref 19–32)
CREATININE: 1.1 mg/dL (ref 0.4–1.2)
Chloride: 98 mEq/L (ref 96–112)
GFR: 52.83 mL/min — AB (ref >60.00)
Glucose, Bld: 94 mg/dL (ref 70–99)
Potassium: 3.8 mEq/L (ref 3.5–5.1)
Sodium: 136 mEq/L (ref 135–145)

## 2013-09-01 LAB — HEPATIC FUNCTION PANEL
ALT: 10 U/L (ref 0–35)
AST: 16 U/L (ref 0–37)
Albumin: 3.7 g/dL (ref 3.5–5.2)
Alkaline Phosphatase: 43 U/L (ref 39–117)
BILIRUBIN TOTAL: 0.5 mg/dL (ref 0.3–1.2)
Bilirubin, Direct: 0 mg/dL (ref 0.0–0.3)
Total Protein: 6.7 g/dL (ref 6.0–8.3)

## 2013-09-01 LAB — CBC WITH DIFFERENTIAL/PLATELET
Basophils Absolute: 0 10*3/uL (ref 0.0–0.1)
Basophils Relative: 0.3 % (ref 0.0–3.0)
EOS PCT: 3.1 % (ref 0.0–5.0)
Eosinophils Absolute: 0.3 10*3/uL (ref 0.0–0.7)
HEMATOCRIT: 34.9 % — AB (ref 36.0–46.0)
HEMOGLOBIN: 11.4 g/dL — AB (ref 12.0–15.0)
LYMPHS ABS: 2.7 10*3/uL (ref 0.7–4.0)
Lymphocytes Relative: 33.7 % (ref 12.0–46.0)
MCHC: 32.7 g/dL (ref 30.0–36.0)
MCV: 89.4 fl (ref 78.0–100.0)
MONOS PCT: 6.7 % (ref 3.0–12.0)
Monocytes Absolute: 0.5 10*3/uL (ref 0.1–1.0)
NEUTROS ABS: 4.5 10*3/uL (ref 1.4–7.7)
Neutrophils Relative %: 56.2 % (ref 43.0–77.0)
PLATELETS: 368 10*3/uL (ref 150.0–400.0)
RBC: 3.9 Mil/uL (ref 3.87–5.11)
RDW: 13.9 % (ref 11.5–14.6)
WBC: 8.1 10*3/uL (ref 4.5–10.5)

## 2013-09-01 LAB — TSH: TSH: 4.48 u[IU]/mL (ref 0.35–5.50)

## 2013-09-01 NOTE — Progress Notes (Signed)
Pre visit review using our clinic review tool, if applicable. No additional management support is needed unless otherwise documented below in the visit note. 

## 2013-09-01 NOTE — Progress Notes (Signed)
   Subjective:    Patient ID: Carla Friedman, female    DOB: 04/24/1934, 80 y.o.   MRN: [id number]  HPI Here with her daughter to follow up. She has been feeling well except for some diarrhea that comes and goes. They have figured out that she is sensitive to lactose so they have eliminated most dairy products from her diet. She denies any SOB or chest pain unless she overdoes it. She wears oxygen at night. Her glucoses have been dropping overnight and she ends up sipping grape juice to keep it up. She takes Metformin in the mornings only but adds 10 units of Lantus at night.    Review of Systems  Constitutional: Negative.   Respiratory: Negative.   Cardiovascular: Negative.   Neurological: Negative.        Objective:   Physical Exam  Constitutional: She appears well-developed and well-nourished.  Uses a walker   Cardiovascular: Normal rate, regular rhythm, normal heart sounds and intact distal pulses.   Pulmonary/Chest: Effort normal and breath sounds normal.          Assessment & Plan:  She is doing well all in all. We will stop the Lantus for now. Get labs including an A1c.

## 2013-09-01 NOTE — Addendum Note (Signed)
Addended by: Colleen Can on: 09/01/2013 01:57 PM   Modules accepted: Orders

## 2013-09-02 ENCOUNTER — Telehealth: Payer: Self-pay | Admitting: Family Medicine

## 2013-09-02 NOTE — Telephone Encounter (Signed)
Relevant patient education mailed to patient.  

## 2013-09-21 DIAGNOSIS — I2510 Atherosclerotic heart disease of native coronary artery without angina pectoris: Secondary | ICD-10-CM | POA: Diagnosis not present

## 2013-09-30 ENCOUNTER — Other Ambulatory Visit: Payer: Self-pay | Admitting: Cardiovascular Disease

## 2013-10-22 DIAGNOSIS — I2510 Atherosclerotic heart disease of native coronary artery without angina pectoris: Secondary | ICD-10-CM | POA: Diagnosis not present

## 2013-11-12 ENCOUNTER — Telehealth: Payer: Self-pay | Admitting: Family Medicine

## 2013-11-12 NOTE — Telephone Encounter (Signed)
Pt fell on Monday 11/10/13 has a hematoma on right eye, vision is ok.   Pt is complaining about temple pain and problems swallowing, per pt's daughter.  (hard to swallow food and liquid). Increased confusion.

## 2013-11-12 NOTE — Telephone Encounter (Signed)
Patient Information:  Caller Name: Vladimir Crofts  Phone: [phone]  Patient: Carla Friedman, Carla Friedman  Gender: Female  DOB: [birth year]  Age: 80 Years  PCP: Alysia [facility])  Office Follow Up:  Does the office need to follow up with this patient?: No  Instructions For The Office: N/A  RN Note:  Spoke with office - call was sent to triage to determine where patient should be seen.  If needing ER then to send.  Symptoms  Reason For Call & Symptoms: [place] on face, hit right eye and temple sometime Sunday night - unsure what she hit or whether she was unconscious since she was by herself.  Found on Monday am to be much more confused than usual since before fall would be able to answer questions but now can't recount any of what happened.  Has very large hematoma over right eye, now new large bump over eyebrow, lot of bruising.  C/O pain in right temple.  Yesterday 4/21 found choking at lunchtime with problems swallowing. Says that before fall would have been able to answer questions, would have been able to remember what happened.  Reviewed Health History In EMR: Yes  Reviewed Medications In EMR: Yes  Reviewed Allergies In EMR: Yes  Reviewed Surgeries / Procedures: Yes  Date of Onset of Symptoms: 11/09/2013  Guideline(s) Used:  Face Injury  Eye Injury  Head Injury  Disposition Per Guideline:   Go to ED Now  Reason For Disposition Reached:   Can't remember what happened (amnesia)  Advice Given:  N/A  Patient Will Follow Care Advice:  YES

## 2013-11-12 NOTE — Telephone Encounter (Signed)
I advised the person taking this call Kenney Houseman ) to transfer to the call a nurse line for this to be triaged. Dr. Sarajane Jews has already left for the day and pt needs to be triaged.

## 2013-11-14 ENCOUNTER — Other Ambulatory Visit: Payer: Self-pay | Admitting: Family Medicine

## 2013-11-14 ENCOUNTER — Other Ambulatory Visit: Payer: Self-pay | Admitting: Cardiovascular Disease

## 2013-11-21 DIAGNOSIS — I10 Essential (primary) hypertension: Secondary | ICD-10-CM | POA: Diagnosis not present

## 2013-11-21 DIAGNOSIS — R0602 Shortness of breath: Secondary | ICD-10-CM | POA: Diagnosis not present

## 2013-11-21 DIAGNOSIS — E1159 Type 2 diabetes mellitus with other circulatory complications: Secondary | ICD-10-CM | POA: Diagnosis not present

## 2013-11-21 DIAGNOSIS — E785 Hyperlipidemia, unspecified: Secondary | ICD-10-CM | POA: Diagnosis not present

## 2013-11-21 DIAGNOSIS — I209 Angina pectoris, unspecified: Secondary | ICD-10-CM | POA: Diagnosis not present

## 2013-11-21 DIAGNOSIS — I2510 Atherosclerotic heart disease of native coronary artery without angina pectoris: Secondary | ICD-10-CM | POA: Diagnosis not present

## 2013-11-21 DIAGNOSIS — R413 Other amnesia: Secondary | ICD-10-CM | POA: Diagnosis not present

## 2013-11-21 DIAGNOSIS — R059 Cough, unspecified: Secondary | ICD-10-CM | POA: Diagnosis not present

## 2013-11-21 DIAGNOSIS — R630 Anorexia: Secondary | ICD-10-CM | POA: Diagnosis not present

## 2013-12-13 ENCOUNTER — Other Ambulatory Visit: Payer: Self-pay | Admitting: Cardiovascular Disease

## 2013-12-16 ENCOUNTER — Other Ambulatory Visit: Payer: Self-pay

## 2013-12-16 ENCOUNTER — Other Ambulatory Visit: Payer: Self-pay | Admitting: Family Medicine

## 2013-12-16 ENCOUNTER — Encounter: Payer: Self-pay | Admitting: Cardiovascular Disease

## 2013-12-16 ENCOUNTER — Ambulatory Visit (INDEPENDENT_AMBULATORY_CARE_PROVIDER_SITE_OTHER): Admitting: Cardiovascular Disease

## 2013-12-16 ENCOUNTER — Other Ambulatory Visit: Payer: Self-pay | Admitting: Cardiovascular Disease

## 2013-12-16 VITALS — BP 130/60 | HR 68 | Ht 64.0 in | Wt 149.0 lb

## 2013-12-16 DIAGNOSIS — I2510 Atherosclerotic heart disease of native coronary artery without angina pectoris: Secondary | ICD-10-CM | POA: Diagnosis not present

## 2013-12-16 DIAGNOSIS — E785 Hyperlipidemia, unspecified: Secondary | ICD-10-CM | POA: Diagnosis not present

## 2013-12-16 DIAGNOSIS — E119 Type 2 diabetes mellitus without complications: Secondary | ICD-10-CM | POA: Diagnosis not present

## 2013-12-16 DIAGNOSIS — R0602 Shortness of breath: Secondary | ICD-10-CM | POA: Diagnosis not present

## 2013-12-16 NOTE — Assessment & Plan Note (Signed)
Stable chronic angina much improved with Ranexa  Again told patient we can do diagnostic cath if needed but she persists In wanting medical Rx and avoiding going to hospital

## 2013-12-16 NOTE — Progress Notes (Signed)
Patient ID: [name], female   DOB: 02-13-1934, 80 y.o.   MRN: [id number] Carla Friedman is seen today for CAD, HTN, and elevated lipids. She has had CABG with stents to the native LAD and SVG to OM. She has distal disease and chronic chest pain. She actually is seen by hospice. She prefers not to be seen in hospital alot. She has less chest pain off lisinopril and on Atenolol. . Her last cath was in 11/2006 and she had a non-ischemic myovue in 11/2007. She has morphine and oxygen at home. She has sig. depression. She looked very good today I am not convinced that all of her pains are anginal but she seems satisfied to have oxygen at home.  She also complains of SOB. it does not sound like volume there is no PND, orthopnea, or edema.  She continues to be depressed. She uses oxygen and nitro as a crutch but this has helped her  over the years.  She has significant venous insuf with marked dependant discoloration but no arterial insuf.  Ranexa has helped her quite a bit.   Now at [employer].  Increasingly forgetful      ROS: Denies fever, malais, weight loss, blurry vision, decreased visual acuity, cough, sputum, SOB, hemoptysis, pleuritic pain, palpitaitons, heartburn, abdominal pain, melena, lower extremity edema, claudication, or rash.  All other systems reviewed and negative  General: Affect appropriate Healthy:  appears stated age 42: normal Neck supple with no adenopathy JVP normal no bruits no thyromegaly Lungs clear with no wheezing and good diaphragmatic motion Heart:  S1/S2 no murmur, no rub, gallop or click PMI normal Abdomen: benighn, BS positve, no tenderness, no AAA no bruit.  No HSM or HJR Distal pulses intact with no bruits No edema Neuro non-focal Skin warm and dry No muscular weakness   Current Outpatient Prescriptions  Medication Sig Dispense Refill  . amitriptyline (ELAVIL) 50 MG tablet Take 1 tablet (50 mg total) by mouth at bedtime.  30 tablet  11  .  Ascorbic Acid (VITAMIN C) 500 MG tablet Take 500 mg by mouth daily.        Marland Kitchen aspirin 81 MG tablet Take 81 mg by mouth daily.        Marland Kitchen atenolol (TENORMIN) 50 MG tablet TAKE ONE TABLET BY MOUTH ONE TIME DAILY   30 tablet  5  . Bee Pollen 500 MG TABS Take 1 tablet by mouth daily.       . Calcium Carbonate (CALCIUM 600 PO) Take by mouth. 1 po daily       . Cholecalciferol (VITAMIN D) 1000 UNITS capsule Take 1,000 Units by mouth daily.        . clopidogrel (PLAVIX) 75 MG tablet TAKE ONE TABLET BY MOUTH ONE TIME DAILY   30 tablet  0  . furosemide (LASIX) 20 MG tablet TAKE ONE TABLET BY MOUTH ONE TIME DAILY   30 tablet  6  . gabapentin (NEURONTIN) 100 MG capsule TAKE ONE CAPSULE BY MOUTH THREE TIMES DAILY   90 capsule  0  . isosorbide dinitrate (ISORDIL) 30 MG tablet TAKE ONE TABLET BY MOUTH TWICE DAILY   60 tablet  10  . metFORMIN (GLUCOPHAGE) 1000 MG tablet Take 1,000 mg by mouth 2 (two) times daily as needed.      . Multiple Vitamin (MULTIVITAMIN) capsule Take 1 capsule by mouth daily.        . nitroGLYCERIN (NITROSTAT) 0.4 MG SL tablet Place 0.4 mg under the tongue every  5 (five) minutes as needed.        Marland Kitchen oxycodone (OXY-IR) 5 MG capsule Take 5 mg by mouth every 4 (four) hours as needed.       . pravastatin (PRAVACHOL) 40 MG tablet Take one tablet by mouth one time daily  30 tablet  0  . promethazine (PHENERGAN) 25 MG tablet Take 25 mg by mouth every 6 (six) hours as needed.        . ramipril (ALTACE) 10 MG capsule Take 1 capsule (10 mg total) by mouth daily.  30 capsule  11  . RANEXA 500 MG 12 hr tablet TAKE ONE TABLET BY MOUTH TWICE DAILY   60 tablet  0   No current facility-administered medications for this visit.    Allergies  Alprazolam; Amoxicillin; Cephalexin; Codeine; Codeine phosphate; Doxycycline; Hydrochlorothiazide; Morphine; Morphine sulfate; Niacin; and Omeprazole  Electrocardiogram:  08/07/13  SR rate 67 LAD nonspecific ST changes   Assessment and Plan

## 2013-12-16 NOTE — Patient Instructions (Addendum)
Your physician wants you to follow-up in:  6 MONTHS WITH DR NISHAN  You will receive a reminder letter in the mail two months in advance. If you don't receive a letter, please call our office to schedule the follow-up appointment. Your physician recommends that you continue on your current medications as directed. Please refer to the Current Medication list given to you today. 

## 2013-12-16 NOTE — Assessment & Plan Note (Signed)
Discussed low carb diet.  Target hemoglobin A1c is 6.5 or less.  Continue current medications.  

## 2013-12-16 NOTE — Assessment & Plan Note (Signed)
Cholesterol is at goal.  Continue current dose of statin and diet Rx.  No myalgias or side effects.  F/U  LFT's in 6 months. Lab Results  Component Value Date   LDLCALC  Value: 53        Total Cholesterol/HDL:CHD Risk Coronary Heart Disease Risk Table                     Men   Women  1/2 Average Risk   3.4   3.3  Average Risk       5.0   4.4  2 X Average Risk   9.6   7.1  3 X Average Risk  23.4   11.0        Use the calculated Patient Ratio above and the CHD Risk Table to determine the patient's CHD Risk.        ATP III CLASSIFICATION (LDL):  <100     mg/dL   Optimal  100-129  mg/dL   Near or Above                    Optimal  130-159  mg/dL   Borderline  160-189  mg/dL   High  >190     mg/dL   Very High 11/02/2008

## 2013-12-16 NOTE — Assessment & Plan Note (Signed)
COPD stable  Continue oxygen at night  F/u pulmonary

## 2013-12-22 DIAGNOSIS — I10 Essential (primary) hypertension: Secondary | ICD-10-CM | POA: Diagnosis not present

## 2013-12-22 DIAGNOSIS — R0602 Shortness of breath: Secondary | ICD-10-CM | POA: Diagnosis not present

## 2013-12-22 DIAGNOSIS — I2510 Atherosclerotic heart disease of native coronary artery without angina pectoris: Secondary | ICD-10-CM | POA: Diagnosis not present

## 2013-12-22 DIAGNOSIS — R059 Cough, unspecified: Secondary | ICD-10-CM | POA: Diagnosis not present

## 2013-12-22 DIAGNOSIS — I209 Angina pectoris, unspecified: Secondary | ICD-10-CM | POA: Diagnosis not present

## 2013-12-22 DIAGNOSIS — E1159 Type 2 diabetes mellitus with other circulatory complications: Secondary | ICD-10-CM | POA: Diagnosis not present

## 2013-12-22 DIAGNOSIS — E785 Hyperlipidemia, unspecified: Secondary | ICD-10-CM | POA: Diagnosis not present

## 2013-12-22 DIAGNOSIS — R630 Anorexia: Secondary | ICD-10-CM | POA: Diagnosis not present

## 2013-12-22 DIAGNOSIS — R413 Other amnesia: Secondary | ICD-10-CM | POA: Diagnosis not present

## 2014-01-07 ENCOUNTER — Other Ambulatory Visit: Payer: Self-pay | Admitting: Family Medicine

## 2014-01-13 ENCOUNTER — Telehealth: Payer: Self-pay | Admitting: Family Medicine

## 2014-01-13 ENCOUNTER — Telehealth: Payer: Self-pay | Admitting: Cardiovascular Disease

## 2014-01-13 NOTE — Telephone Encounter (Signed)
[name] from Hospice calling in to request that pt have an ultrasound, in attempt to see gallbladder and liver.  Hospice nurse states that right upper quadrant is swollen and pt is in extreme pain.  Pt's daughter will bring her to appointment, her name is Norton Blizzard [phone] please give her appointment information.  Please fax results to Hospice at [phone]

## 2014-01-13 NOTE — Telephone Encounter (Signed)
I left a voice message for Carla Friedman, advised that pt would have to be seen by another provider to discuss such test. Dr. Sarajane Jews is out of the office until next week.

## 2014-01-13 NOTE — Telephone Encounter (Signed)
New message          Pt daughter is calling to get clearance for mom to get ultrasound done tomorrow 6/23 / ultrasound has been ordered by Hospice Doctor / Family PCP is out of the office all week. / Pt is losing 5-7 pounds a week / please give pt daughter a call.

## 2014-01-13 NOTE — Telephone Encounter (Signed)
Pt daughter will check with hospice md and see if md can order test

## 2014-01-13 NOTE — Telephone Encounter (Signed)
Returned call to patient's daughter Drue Dun she stated she is in a delimma.Stated hospice Dr.saw her mother today.Stated she has a new mass on her liver.Stated he wants her to have a ultrasound.Stated he wanted her PCP to order U/S.Stated PCP out of office and she needs U/S by tomorrow.Stated she wanted to know if Dr.Nishan could order U/S now.Advised Dr.Nishan not in office today.Stated mother is in pain and has lost 12 lbs in 3 weeks.Advised to take mother to [facility] ER.

## 2014-01-14 ENCOUNTER — Emergency Department (HOSPITAL_COMMUNITY)
Admission: EM | Admit: 2014-01-14 | Discharge: 2014-01-14 | Disposition: A | Discharge status: Home / Self Care | Payer: Medicare Other | Attending: Emergency Medicine | Admitting: Emergency Medicine

## 2014-01-14 ENCOUNTER — Emergency Department (HOSPITAL_COMMUNITY): Payer: Medicare Other

## 2014-01-14 ENCOUNTER — Other Ambulatory Visit: Payer: Self-pay | Admitting: *Deleted

## 2014-01-14 ENCOUNTER — Encounter (HOSPITAL_COMMUNITY): Payer: Self-pay | Admitting: Emergency Medicine

## 2014-01-14 DIAGNOSIS — K8020 Calculus of gallbladder without cholecystitis without obstruction: Secondary | ICD-10-CM | POA: Diagnosis not present

## 2014-01-14 DIAGNOSIS — Z9889 Other specified postprocedural states: Secondary | ICD-10-CM | POA: Diagnosis not present

## 2014-01-14 DIAGNOSIS — E1142 Type 2 diabetes mellitus with diabetic polyneuropathy: Secondary | ICD-10-CM | POA: Diagnosis not present

## 2014-01-14 DIAGNOSIS — I739 Peripheral vascular disease, unspecified: Secondary | ICD-10-CM | POA: Insufficient documentation

## 2014-01-14 DIAGNOSIS — E1149 Type 2 diabetes mellitus with other diabetic neurological complication: Secondary | ICD-10-CM | POA: Diagnosis not present

## 2014-01-14 DIAGNOSIS — R079 Chest pain, unspecified: Secondary | ICD-10-CM | POA: Diagnosis not present

## 2014-01-14 DIAGNOSIS — I10 Essential (primary) hypertension: Secondary | ICD-10-CM | POA: Insufficient documentation

## 2014-01-14 DIAGNOSIS — Z85828 Personal history of other malignant neoplasm of skin: Secondary | ICD-10-CM | POA: Diagnosis not present

## 2014-01-14 DIAGNOSIS — M549 Dorsalgia, unspecified: Secondary | ICD-10-CM | POA: Diagnosis not present

## 2014-01-14 DIAGNOSIS — Z79899 Other long term (current) drug therapy: Secondary | ICD-10-CM | POA: Diagnosis not present

## 2014-01-14 DIAGNOSIS — F3289 Other specified depressive episodes: Secondary | ICD-10-CM | POA: Insufficient documentation

## 2014-01-14 DIAGNOSIS — R1011 Right upper quadrant pain: Secondary | ICD-10-CM

## 2014-01-14 DIAGNOSIS — I259 Chronic ischemic heart disease, unspecified: Secondary | ICD-10-CM | POA: Diagnosis not present

## 2014-01-14 DIAGNOSIS — Z87891 Personal history of nicotine dependence: Secondary | ICD-10-CM | POA: Diagnosis not present

## 2014-01-14 DIAGNOSIS — F329 Major depressive disorder, single episode, unspecified: Secondary | ICD-10-CM | POA: Diagnosis not present

## 2014-01-14 DIAGNOSIS — R0789 Other chest pain: Secondary | ICD-10-CM | POA: Diagnosis not present

## 2014-01-14 DIAGNOSIS — E785 Hyperlipidemia, unspecified: Secondary | ICD-10-CM | POA: Diagnosis not present

## 2014-01-14 HISTORY — DX: Disorientation, unspecified: R41.0

## 2014-01-14 LAB — COMPREHENSIVE METABOLIC PANEL
ALK PHOS: 62 U/L (ref 39–117)
ALT: 19 U/L (ref 0–35)
AST: 21 U/L (ref 0–37)
Albumin: 3.7 g/dL (ref 3.5–5.2)
BILIRUBIN TOTAL: 0.4 mg/dL (ref 0.3–1.2)
BUN: 17 mg/dL (ref 6–23)
CO2: 28 mEq/L (ref 19–32)
Calcium: 9.5 mg/dL (ref 8.4–10.5)
Chloride: 93 mEq/L — ABNORMAL LOW (ref 96–112)
Creatinine, Ser: 1.18 mg/dL — ABNORMAL HIGH (ref 0.50–1.10)
GFR calc non Af Amer: 43 mL/min — ABNORMAL LOW (ref >90)
GFR, EST AFRICAN AMERICAN: 50 mL/min — AB (ref >90)
Glucose, Bld: 196 mg/dL — ABNORMAL HIGH (ref 70–99)
POTASSIUM: 3.9 meq/L (ref 3.7–5.3)
Sodium: 135 mEq/L — ABNORMAL LOW (ref 137–147)
TOTAL PROTEIN: 6.7 g/dL (ref 6.0–8.3)

## 2014-01-14 LAB — CBC WITH DIFFERENTIAL/PLATELET
Basophils Absolute: 0 10*3/uL (ref 0.0–0.1)
Basophils Relative: 0 % (ref 0–1)
EOS PCT: 4 % (ref 0–5)
Eosinophils Absolute: 0.2 10*3/uL (ref 0.0–0.7)
HEMATOCRIT: 37 % (ref 36.0–46.0)
Hemoglobin: 12.5 g/dL (ref 12.0–15.0)
LYMPHS ABS: 2.6 10*3/uL (ref 0.7–4.0)
Lymphocytes Relative: 41 % (ref 12–46)
MCH: 29.3 pg (ref 26.0–34.0)
MCHC: 33.8 g/dL (ref 30.0–36.0)
MCV: 86.9 fL (ref 78.0–100.0)
Monocytes Absolute: 0.5 10*3/uL (ref 0.1–1.0)
Monocytes Relative: 8 % (ref 3–12)
NEUTROS PCT: 47 % (ref 43–77)
Neutro Abs: 3 10*3/uL (ref 1.7–7.7)
Platelets: 247 10*3/uL (ref 150–400)
RBC: 4.26 MIL/uL (ref 3.87–5.11)
RDW: 13.2 % (ref 11.5–15.5)
WBC: 6.3 10*3/uL (ref 4.0–10.5)

## 2014-01-14 LAB — LIPASE, BLOOD: Lipase: 18 U/L (ref 11–59)

## 2014-01-14 LAB — CBG MONITORING, ED: Glucose-Capillary: 167 mg/dL — ABNORMAL HIGH (ref 70–99)

## 2014-01-14 MED ORDER — IOHEXOL 300 MG/ML  SOLN
25.0000 mL | INTRAMUSCULAR | Status: AC
  Administered 2014-01-14: 25 mL via ORAL

## 2014-01-14 NOTE — ED Provider Notes (Signed)
CSN: [id number]     Arrival date & time 01/14/14  0802 History   First MD Initiated Contact with Patient 01/14/14 0805     Chief Complaint  Patient presents with  . Abdominal Pain      HPI Pt was seen at 0810.  Per pt, c/o gradual onset and persistence of constant RUQ abd "pain" for the past 3 to 4 weeks. Pt describes the pain as "it feels swollen" and "hurts when I touch it." Pt was evaluated by her Hospice MD yesterday who told pt to come to the ED "for an ultrasound." Pt's family requesting "a ultrasound of her abdomen."  Pt describes the abd pain as "sore."  Denies N/V, no diarrhea, no fevers, no back pain, no rash, no CP/SOB, no black or blood in stools, no injury.       Past Medical History  Diagnosis Date  . Coronary artery disease     sees Dr. Johnsie Cancel, in Hospice for this   . Chest pain, atypical   . PVD (peripheral vascular disease)   . Hypertension   . Dyslipidemia   . Diabetes mellitus type II   . Peptic ulcer disease   . Depression   . Diabetic neuropathy   . Hypothyroidism   . Back pain     low  . Cancer, skin, squamous cell     sees Dr. Lois Huxley at [facility] dermatology  . Confusion    Past Surgical History  Procedure Laterality Date  . Appendectomy    . Tonsillectomy    . Coronary artery bypass graft      stent placement 2001  . Cataract extraction    . Colonoscopy  02-20-08    per Dr. Olevia Perches, diverticulosis, no polyps, repeat in 10 yrs    Family History  Problem Relation Age of Onset  . Emphysema Mother   . Heart attack Father   . Diabetes Father   . Breast cancer Maternal Grandmother   . Diabetes Paternal Grandfather   . Hypertension      family history   History  Substance Use Topics  . Smoking status: Former Research scientist (life sciences)  . Smokeless tobacco: Never Used  . Alcohol Use: No    Review of Systems ROS: Statement: All systems negative except as marked or noted in the HPI; Constitutional: Negative for fever and chills. ; ; Eyes: Negative for eye  pain, redness and discharge. ; ; ENMT: Negative for ear pain, hoarseness, nasal congestion, sinus pressure and sore throat. ; ; Cardiovascular: Negative for chest pain, palpitations, diaphoresis, dyspnea and peripheral edema. ; ; Respiratory: Negative for cough, wheezing and stridor. ; ; Gastrointestinal: +abd pain. Negative for nausea, vomiting, diarrhea, blood in stool, hematemesis, jaundice and rectal bleeding. . ; ; Genitourinary: Negative for dysuria, flank pain and hematuria. ; ; Musculoskeletal: Negative for back pain and neck pain. Negative for swelling and trauma.; ; Skin: Negative for pruritus, rash, abrasions, blisters, bruising and skin lesion.; ; Neuro: Negative for headache, lightheadedness and neck stiffness. Negative for weakness, altered level of consciousness , altered mental status, extremity weakness, paresthesias, involuntary movement, seizure and syncope.       Allergies  Morphine; Other; and Codeine  Home Medications   Prior to Admission medications   Medication Sig Start Date End Date Taking? Authorizing Provider  amitriptyline (ELAVIL) 50 MG tablet Take 1 tablet (50 mg total) by mouth at bedtime. 02/24/13  Yes Laurey Morale, MD  Ascorbic Acid (VITAMIN C) 500 MG tablet Take 500  mg by mouth daily.     Yes Historical Provider, MD  aspirin 81 MG tablet Take 81 mg by mouth at bedtime.    Yes Historical Provider, MD  atenolol (TENORMIN) 50 MG tablet Take 50 mg by mouth daily.   Yes Historical Provider, MD  Bee Pollen 500 MG TABS Take 500 mg by mouth daily.    Yes Historical Provider, MD  Calcium Carbonate (CALCIUM 600 PO) Take 600 mg by mouth daily.    Yes Historical Provider, MD  Cholecalciferol (VITAMIN D) 1000 UNITS capsule Take 1,000 Units by mouth daily.     Yes Historical Provider, MD  clopidogrel (PLAVIX) 75 MG tablet Take 75 mg by mouth daily with breakfast.   Yes Historical Provider, MD  furosemide (LASIX) 20 MG tablet Take 20 mg by mouth daily.   Yes Historical  Provider, MD  gabapentin (NEURONTIN) 100 MG capsule Take 100 mg by mouth 2 (two) times daily.   Yes Historical Provider, MD  isosorbide dinitrate (ISORDIL) 30 MG tablet Take 30 mg by mouth 2 (two) times daily.   Yes Historical Provider, MD  metFORMIN (GLUCOPHAGE) 1000 MG tablet Take 1,000 mg by mouth daily with breakfast.   Yes Historical Provider, MD  Multiple Vitamin (MULTIVITAMIN) capsule Take 1 capsule by mouth daily.     Yes Historical Provider, MD  nitroGLYCERIN (NITROSTAT) 0.4 MG SL tablet Place 0.4 mg under the tongue every 5 (five) minutes as needed for chest pain.    Yes Historical Provider, MD  pravastatin (PRAVACHOL) 40 MG tablet Take 40 mg by mouth every evening.   Yes Historical Provider, MD  ramipril (ALTACE) 10 MG capsule Take 1 capsule (10 mg total) by mouth daily. 02/24/13  Yes Laurey Morale, MD  ranolazine (RANEXA) 500 MG 12 hr tablet Take 500 mg by mouth 2 (two) times daily.   Yes Historical Provider, MD  oxycodone (OXY-IR) 5 MG capsule Take 5 mg by mouth every 4 (four) hours as needed (severe pain).     Historical Provider, MD  promethazine (PHENERGAN) 25 MG tablet Take 25 mg by mouth every 6 (six) hours as needed for nausea or vomiting.     Historical Provider, MD   BP 135/51  Pulse 66  Temp(Src) 97.4 F (36.3 C)  Resp 18  SpO2 100% Physical Exam 0815: Physical examination:  Nursing notes reviewed; Vital signs and O2 SAT reviewed;  Constitutional: Well developed, Well nourished, Well hydrated, In no acute distress; Head:  Normocephalic, atraumatic; Eyes: EOMI, PERRL, No scleral icterus; ENMT: Mouth and pharynx normal, Mucous membranes moist; Neck: Supple, Full range of motion, No lymphadenopathy; Cardiovascular: Regular rate and rhythm, No gallop; Respiratory: Breath sounds clear & equal bilaterally, No wheezes.  Speaking full sentences with ease, Normal respiratory effort/excursion; Chest: Nontender, Movement normal; Abdomen: Soft, +RUQ tenderness to palp. No rebound or  guarding. No ecchymosis or abrasions. Nondistended, Normal bowel sounds; Genitourinary: No CVA tenderness; Extremities: Pulses normal, No tenderness, No edema, No calf edema or asymmetry.; Neuro: AA&Ox3, vague historian. Major CN grossly intact.  Speech clear. No gross focal motor or sensory deficits in extremities. Climbs on and off stretcher easily by herself. Gait steady.; Skin: Color normal, Warm, Dry.   ED Course  Procedures     EKG Interpretation None      MDM  MDM Reviewed: previous chart, nursing note and vitals Reviewed previous: labs Interpretation: labs, ultrasound, x-ray and CT scan     Results for orders placed during the hospital encounter of 01/14/14  CBC WITH DIFFERENTIAL      Result Value Ref Range   WBC 6.3  4.0 - 10.5 K/uL   RBC 4.26  3.87 - 5.11 MIL/uL   Hemoglobin 12.5  12.0 - 15.0 g/dL   HCT 37.0  36.0 - 46.0 %   MCV 86.9  78.0 - 100.0 fL   MCH 29.3  26.0 - 34.0 pg   MCHC 33.8  30.0 - 36.0 g/dL   RDW 13.2  11.5 - 15.5 %   Platelets 247  150 - 400 K/uL   Neutrophils Relative % 47  43 - 77 %   Neutro Abs 3.0  1.7 - 7.7 K/uL   Lymphocytes Relative 41  12 - 46 %   Lymphs Abs 2.6  0.7 - 4.0 K/uL   Monocytes Relative 8  3 - 12 %   Monocytes Absolute 0.5  0.1 - 1.0 K/uL   Eosinophils Relative 4  0 - 5 %   Eosinophils Absolute 0.2  0.0 - 0.7 K/uL   Basophils Relative 0  0 - 1 %   Basophils Absolute 0.0  0.0 - 0.1 K/uL  COMPREHENSIVE METABOLIC PANEL      Result Value Ref Range   Sodium 135 (*) 137 - 147 mEq/L   Potassium 3.9  3.7 - 5.3 mEq/L   Chloride 93 (*) 96 - 112 mEq/L   CO2 28  19 - 32 mEq/L   Glucose, Bld 196 (*) 70 - 99 mg/dL   BUN 17  6 - 23 mg/dL   Creatinine, Ser 1.18 (*) 0.50 - 1.10 mg/dL   Calcium 9.5  8.4 - 10.5 mg/dL   Total Protein 6.7  6.0 - 8.3 g/dL   Albumin 3.7  3.5 - 5.2 g/dL   AST 21  0 - 37 U/L   ALT 19  0 - 35 U/L   Alkaline Phosphatase 62  39 - 117 U/L   Total Bilirubin 0.4  0.3 - 1.2 mg/dL   GFR calc non Af Amer 43 (*)  >90 mL/min   GFR calc Af Amer 50 (*) >90 mL/min  LIPASE, BLOOD      Result Value Ref Range   Lipase 18  11 - 59 U/L   Dg Chest 2 View 01/14/2014   CLINICAL DATA:  Chest pain  EXAM: CHEST  2 VIEW  COMPARISON:  11/01/2009  FINDINGS: Cardiac shadow is within normal limits. Postsurgical changes are again noted. Cardiac. Stents are seen. The lungs are clear. Degenerative changes of the thoracic spine are seen.  IMPRESSION: No active cardiopulmonary disease.   Electronically Signed   By: Inez Catalina M.D.   On: 01/14/2014 08:55   US Abdomen Complete 01/14/2014   CLINICAL DATA:  Right upper quadrant pain  EXAM: ULTRASOUND ABDOMEN COMPLETE  COMPARISON:  None.  FINDINGS: Gallbladder:  Layering echogenic gallstones. Multiple very small stones are present within the gallbladder. Gallbladder wall is 2.0 mm. Negative sonographic Murphy sign.  Common bile duct:  Diameter: 7.1 mm.  Liver:  No focal lesion identified. Within normal limits in parenchymal echogenicity.  IVC:  No abnormality visualized.  Pancreas:  Visualized portion unremarkable.  Spleen:  Size and appearance within normal limits.  Right Kidney:  Length: 9.7 cm. Echogenicity within normal limits. No mass or hydronephrosis visualized.  Left Kidney:  Length: 8.6 cm. Fetal lobulation left midpole. Echogenicity within normal limits. No mass or hydronephrosis visualized.  Abdominal aorta:  No aneurysm visualized.  Other findings:  None.  IMPRESSION: Cholelithiasis. No gallbladder  wall thickening. Common bile duct upper normal in diameter 7.1 mm.   Electronically Signed   By: Franchot Gallo M.D.   On: 01/14/2014 09:35    0940:  Pt's family states they brought pt to the ED today "just for an ultrasound." Dx and testing d/w pt's family.  Questions answered.  Verb understanding. They are agreeable to proceed with CT scan.    Ct Abdomen Pelvis Wo Contrast 01/14/2014   CLINICAL DATA:  Abdominal pain  EXAM: CT ABDOMEN AND PELVIS WITHOUT CONTRAST  TECHNIQUE:  Multidetector CT imaging of the abdomen and pelvis was performed following the standard protocol without IV contrast.  COMPARISON:  Ultrasound 01/14/2014  FINDINGS: Calcified granuloma right lung base just above the diaphragm. Negative for infiltrate or effusion.  Liver and spleen are normal. Multiple small calcified gallstones. No gallbladder wall thickening or biliary duct dilatation.  Atrophic pancreas without edema or mass.  Negative for renal calculi. No renal obstruction. Lobulation of the left lateral renal cortex most likely normal cortex. This is isoechoic to cortex on the ultrasound.  Aorta iliac calcification without aneurysm.  Sigmoid diverticulosis. Negative for diverticulitis. Negative for bowel obstruction. No free fluid. No mass or adenopathy. Appendix not visualized.  Lumbar degenerative changes. No acute bony abnormality in the spine. Degenerative change in the right hip with joint space narrowing and osteophyte formation.  IMPRESSION: Cholelithiasis without gallbladder wall thickening.  Negative for bowel obstruction.  Sigmoid diverticulosis.  Lobular contour left lateral kidney most compatible with fetal lobulation.   Electronically Signed   By: Franchot Gallo M.D.   On: 01/14/2014 12:33    1250:  Workup reassuring. Pt and her family would like to go home now. Dx and testing d/w pt and family.  Questions answered.  Verb understanding, agreeable to d/c home with outpt f/u.   Alfonzo Feller, DO 01/17/14 2224

## 2014-01-14 NOTE — Discharge Instructions (Signed)
°Emergency Department Resource Guide °1) Find a Doctor and Pay Out of Pocket °Although you won't have to find out who is covered by your insurance plan, it is a good idea to ask around and get recommendations. You will then need to call the office and see if the doctor you have chosen will accept you as a new patient and what types of options they offer for patients who are self-pay. Some doctors offer discounts or will set up payment plans for their patients who do not have insurance, but you will need to ask so you aren't surprised when you get to your appointment. ° °2) Contact Your Local Health Department °Not all health departments have doctors that can see patients for sick visits, but many do, so it is worth a call to see if yours does. If you don't know where your local health department is, you can check in your phone book. The CDC also has a tool to help you locate your state's health department, and many state websites also have listings of all of their local health departments. ° °3) Find a Walk-in Clinic °If your illness is not likely to be very severe or complicated, you may want to try a walk in clinic. These are popping up all over the country in pharmacies, drugstores, and shopping centers. They're usually staffed by nurse practitioners or physician assistants that have been trained to treat common illnesses and complaints. They're usually fairly quick and inexpensive. However, if you have serious medical issues or chronic medical problems, these are probably not your best option. ° °No Primary Care Doctor: °- Call Health Connect at  832-8000 - they can help you locate a primary care doctor that  accepts your insurance, provides certain services, etc. °- Physician Referral Service- 1-800-533-3463 ° °Chronic Pain Problems: °Organization         Address  Phone   Notes  °[facility]  (336) 297-2271 Patients need to be referred by their primary care doctor.  ° °Medication  Assistance: °Organization         Address  Phone   Notes  °[place] [facility] [address] °[place], [place] [address] (336) 641-8030 --Must be a resident of [place] °-- Must have NO insurance coverage whatsoever (no Medicaid/ Medicare, etc.) °-- The pt. MUST have a primary care doctor that directs their care regularly and follows them in the community °  °[facility]  (866) 331-1348   °[employer]  (888) 892-1162   ° °Agencies that provide inexpensive medical care: °Organization         Address  Phone   Notes  °[facility]  (336) 832-8035   °[facility]    (336) 832-7272   °[facility] [address] °[place], [place] [address] (336) 832-4777   °[facility] [address], °[place] (336) 271-4999   °[facility]    (336) 373-0678   °[facility]    (336) 272-1050   °[facility] ° [address],  Phone:  (336) 832-4444, Fax:  (336) 832-4440 Hours of Operation:  9 am - 6 pm, M-F.  Also accepts Medicaid/Medicare and self-pay.  °[facility] ° [address],  Phone: (336) 832-3150, Fax: (336) 832-3151. Hours of Operation:  8:30 am - 5:30 pm, M-F.  Also accepts Medicaid and self-pay.  °[facility] [place] [address], [place] Phone: (336) 878-6027   °[facility] [address], [place], [place] (336)723-1848, Ext. 123 Mondays & Thursdays: 7-9 AM.  First 15 patients are seen on a first come, first serve basis. °  ° °Medicaid-accepting [place] Providers: ° °Organization         Address  Phone   Notes  °[facility] [address], [place] (336) 641-2100 Also accepts self-pay patients.  °[facility] [address], [place] ° (336) 856-9996   °[facility] [address], [place]  (336) 288-8857   °[facility] [address] [place] [address], [place] (336) 299-7000   °Veita Bland [address], [place]  ° (336) 373-1557 Only accepts [place] Access Medicaid patients after they have their name applied to their card.  ° °Self-Pay (no insurance) in [place]: ° °Organization         Address  Phone   Notes  °Sickle Cell Patients, [facility] [address], [place] (336) 832-1970   °[facility] [address], [place] (336) 832-4400   °[facility] ° [address], [place] (336) 992-4800   °[facility]/Dr. Osei-Bonsu ° [address] [place] [address], [place] or [address], [place] (336) 841-8500 Phone number for both [place] and [place] locations is the same.  °[facility] [address], [place] (336) 299-0000   °[facility] [address] [place] [address], [place] or [address] (336) 852-7530 °(336) 878-2260   °[facility] [address], [place] (336) 350-1642, phone; (336) 294-5005, fax Sees patients 1st and 3rd Saturday of every month.  Must not qualify for public or private insurance (i.e. Medicaid, Medicare, [place] Health Choice, Veterans' Benefits) • Household income should be no more than 200% of the poverty level •The clinic cannot treat you if you are pregnant or think you are pregnant • Sexually transmitted diseases are not treated at the clinic.  ° ° °Dental Care: °Organization         Address  Phone  Notes  °[place] [facility] [address], [place] (336) 641-6152 Accepts children up to age 21 who are enrolled in Medicaid or [place] Health Choice; pregnant women with a Medicaid card; and children who have applied for Medicaid or [place] Health Choice, but were declined, whose parents can pay a reduced fee at time of service.  °[place]  [facility] [place]  [address], [place] (336) 641-7733 Accepts children up to age 21 who are enrolled in Medicaid or [place] Health Choice; pregnant women with a Medicaid card; and children who have applied for Medicaid or [place] Health Choice, but were declined, whose parents can pay a reduced fee at time of service.  °[facility] ° [address],  (336) 641-4533 Patients are seen by appointment only. Walk-ins are not accepted. [facility] will see patients 18 years of age and older. °Monday - Tuesday (8am-5pm) °Most Wednesdays (8:30-5pm) °$30 per visit, cash only  °[facility] ° [address], [place] (336) 641-4533 Patients are seen by appointment only. Walk-ins are not accepted. [facility] will see patients 18 years of age and older. °One   Wednesday Evening (Monthly: Volunteer Based).  $30 per visit, cash only  °[facility]  (919) 537-3737 for adults; Children under age 4, call Graduate Pediatric Dentistry at (919) [phone]. Children aged 4-14, please call (919) [phone] to request a pediatric application. ° Dental services are provided in all areas of dental care including fillings, crowns and bridges, complete and partial dentures, implants, gum treatment, root canals, and extractions. Preventive care is also provided. Treatment is provided to both adults and children. °Patients are selected via a lottery and there is often a waiting list. °  °[facility] [address], °[place] ° (336) 763-8833 www.drcivils.[email or web address] °  °[facility] [address], [place], [place] (336)723-1848, Ext. 123 Second and Fourth Thursday of each month, opens at 6:30 AM; Clinic ends at 9 AM.  Patients are seen on a first-come first-served basis, and a limited number are seen during each clinic.  ° °[facility] ° [address], [place], [place] (336) 723-7904    Eligibility Requirements °You must have lived in [place], [place], or [place] counties for at least the last three months. °  You cannot be eligible for state or federal sponsored healthcare insurance, including Veterans Administration, Medicaid, or Medicare. °  You generally cannot be eligible for healthcare insurance through your employer.  °  How to apply: °Eligibility screenings are held every Tuesday and Wednesday afternoon from 1:00 pm until 4:00 pm. You do not need an appointment for the interview!  °[facility] [address], [place], [place] 336-631-2330   °[facility]  336-342-8273   °[place] [facility]  336-703-3100   °[facility]  336-570-6415   ° °Behavioral Health Resources in the Community: °Intensive Outpatient Programs °Organization         Address  Phone  Notes  °[facility] [address], [place], [place] 336-878-6098   °[facility] [address], [place], [place] 336-832-9800   °[facility] [address], [place], [place] ° 336-882-2125   °[facility] [address],  °[place], [place] 1-800-853-5163 or 336-641-4981   °Substance Abuse Resources °Organization         Address  Phone  Notes  °[facility]  336-882-2125   °[facility]  336-784-9470   °[facility]  336-285-9073   °[facility]  336-845-3988   °Residential & Outpatient Substance Abuse Program  1-800-659-3381   °Psychological Services °Organization         Address  Phone  Notes  °[facility]  336- 832-9600   °[facility]  336- 378-7881   °[facility] [address], [place] 1-800-853-5163 or 336-641-4981   ° °Mobile Crisis Teams °Organization         Address  Phone  Notes  °[facility], Mobile Crisis Care Unit  1-877-626-1772   °Assertive °[facility] ° [address]  [place], [place] 336-834-9664   °Sharon DeEsch [address] °[place] 336-554-5454   ° °Self-Help/Support Groups °Organization         Address  Phone             Notes  °[facility]  - variety of support groups  336- 373-1402 Call for more information  °Narcotics Anonymous (NA), [facility] [address], °[place]  2 meetings at this location  ° °  Residential Treatment Programs Organization         Address  Phone  Notes  [facility] [address],    [place]  1-775-460-9035   [facility]  [address], [place] [address], [place], [place]   [place], [place] 334-849-2405 Admissions: 8am-3pm M-F  [facility] [address],    [place], [place] 384-536-4680   [facility] [address] [place], [place], [place]   [facility] [address]  [place], [place]   [facility] Intensive Outpatient [address], [place], [place]   [facility] [place]  [place], [place] 1-(435)570-4566 or (223)197-1619   [facility] [address] [place], [place] Accepts Medicaid  [facility] [address]  [place] 1-5673475398 Substance Abuse/Addiction Treatment   [facility]         Address  Phone  Notes  [facility]  548-691-9127   Domenic Schwab, PhD [address] Arlis Porta [place], [place]   (325) 214-3187 or 936-434-0304   [facility] [place], [place] 704-374-6102   [facility] [address] [place], [place] [phone] Insurance/Medicaid/sponsorship through [facility] [address], [address]                                    [place], [place] (332)391-5998 [place] [address], [place] [phone]    Dr. Adele Schilder  [phone]   [facility] Dept. 1) [address], [place] 2) [place] 3)  [address], [place] [phone]  [phone]   [place] 646-717-8757 or (662)624-0577 (After Hours)       Take over the counter tylenol, as directed on packaging, as needed for discomfort.  Call your regular medical doctor today to schedule a follow up appointment within the next 2 days.  Return to the Emergency Department immediately sooner if worsening.

## 2014-01-14 NOTE — ED Notes (Signed)
Patient transported to X-ray then [place]

## 2014-01-14 NOTE — ED Notes (Signed)
Pt from home with c/o RUQ abdominal pain, swelling and tenderness x 1 month.  Pt saw her hospice MD yesterday who was wanting further evaluation to decide how to proceed with care.  Pt in NAD.

## 2014-01-14 NOTE — ED Notes (Signed)
Angel-rn performed the cbg.

## 2014-01-14 NOTE — Telephone Encounter (Signed)
NOTED ./CY 

## 2014-01-21 DIAGNOSIS — E1159 Type 2 diabetes mellitus with other circulatory complications: Secondary | ICD-10-CM | POA: Diagnosis not present

## 2014-01-21 DIAGNOSIS — R0602 Shortness of breath: Secondary | ICD-10-CM | POA: Diagnosis not present

## 2014-01-21 DIAGNOSIS — R630 Anorexia: Secondary | ICD-10-CM | POA: Diagnosis not present

## 2014-01-21 DIAGNOSIS — R059 Cough, unspecified: Secondary | ICD-10-CM | POA: Diagnosis not present

## 2014-01-21 DIAGNOSIS — I209 Angina pectoris, unspecified: Secondary | ICD-10-CM | POA: Diagnosis not present

## 2014-01-21 DIAGNOSIS — I2510 Atherosclerotic heart disease of native coronary artery without angina pectoris: Secondary | ICD-10-CM | POA: Diagnosis not present

## 2014-01-21 DIAGNOSIS — R413 Other amnesia: Secondary | ICD-10-CM | POA: Diagnosis not present

## 2014-01-21 DIAGNOSIS — I10 Essential (primary) hypertension: Secondary | ICD-10-CM | POA: Diagnosis not present

## 2014-01-21 DIAGNOSIS — E785 Hyperlipidemia, unspecified: Secondary | ICD-10-CM | POA: Diagnosis not present

## 2014-01-22 ENCOUNTER — Other Ambulatory Visit: Payer: Self-pay | Admitting: Family Medicine

## 2014-01-28 ENCOUNTER — Ambulatory Visit: Payer: Medicare Other | Admitting: Family Medicine

## 2014-02-06 ENCOUNTER — Encounter: Payer: Self-pay | Admitting: Family Medicine

## 2014-02-06 ENCOUNTER — Ambulatory Visit (INDEPENDENT_AMBULATORY_CARE_PROVIDER_SITE_OTHER): Admitting: Family Medicine

## 2014-02-06 VITALS — BP 140/59 | HR 70 | Temp 98.2°F | Ht 64.0 in | Wt 149.0 lb

## 2014-02-06 DIAGNOSIS — R1011 Right upper quadrant pain: Secondary | ICD-10-CM

## 2014-02-06 DIAGNOSIS — I2510 Atherosclerotic heart disease of native coronary artery without angina pectoris: Secondary | ICD-10-CM

## 2014-02-06 NOTE — Progress Notes (Signed)
Pre visit review using our clinic review tool, if applicable. No additional management support is needed unless otherwise documented below in the visit note. 

## 2014-02-06 NOTE — Progress Notes (Signed)
   Subjective:    Patient ID: Carla Friedman, female    DOB: [birth year], 80 y.o.   MRN: [id number]  HPI Here to follow up an ER visit on 01-14-14 for RUQ pain. She had been having some RUQ pains for a month prior to that but no other sx. She has had chronic loose stools for years and her BMs had not changed. No fever or nausea. Then on the day of the ER visit the pain became worse and the RUQ became swollen. She went to the ER and her CBC was normal. Her abdominal US and CT showed cholelithiasis but no signs of acute obstruction or inflammation. She was sent back home and she has done well since. No pain or any other problems.    Review of Systems  Constitutional: Negative.   Gastrointestinal: Negative.   Genitourinary: Negative.        Objective:   Physical Exam  Constitutional: She appears well-developed and well-nourished.  Abdominal: Soft. Bowel sounds are normal. She exhibits no distension and no mass. There is no rebound and no guarding.  Mildly tender in the RUQ          Assessment & Plan:  She more than likely passed a gall stone the day of the ER visit, but it is hard to say. She seems to be doing well at this point. We will observe and recheck prn

## 2014-02-10 ENCOUNTER — Other Ambulatory Visit: Payer: Self-pay | Admitting: Cardiovascular Disease

## 2014-02-19 ENCOUNTER — Other Ambulatory Visit: Payer: Self-pay | Admitting: Family Medicine

## 2014-02-21 DIAGNOSIS — E785 Hyperlipidemia, unspecified: Secondary | ICD-10-CM | POA: Diagnosis not present

## 2014-02-21 DIAGNOSIS — I209 Angina pectoris, unspecified: Secondary | ICD-10-CM | POA: Diagnosis not present

## 2014-02-21 DIAGNOSIS — R413 Other amnesia: Secondary | ICD-10-CM | POA: Diagnosis not present

## 2014-02-21 DIAGNOSIS — R0602 Shortness of breath: Secondary | ICD-10-CM | POA: Diagnosis not present

## 2014-02-21 DIAGNOSIS — R630 Anorexia: Secondary | ICD-10-CM | POA: Diagnosis not present

## 2014-02-21 DIAGNOSIS — E1159 Type 2 diabetes mellitus with other circulatory complications: Secondary | ICD-10-CM | POA: Diagnosis not present

## 2014-02-21 DIAGNOSIS — I10 Essential (primary) hypertension: Secondary | ICD-10-CM | POA: Diagnosis not present

## 2014-02-21 DIAGNOSIS — I2510 Atherosclerotic heart disease of native coronary artery without angina pectoris: Secondary | ICD-10-CM | POA: Diagnosis not present

## 2014-02-21 DIAGNOSIS — R059 Cough, unspecified: Secondary | ICD-10-CM | POA: Diagnosis not present

## 2014-02-24 ENCOUNTER — Telehealth: Payer: Self-pay | Admitting: Family Medicine

## 2014-02-24 MED ORDER — VALACYCLOVIR HCL 500 MG PO TABS: 500.0000 mg | ORAL_TABLET | Freq: Two times a day (BID) | ORAL | Status: DC

## 2014-02-24 NOTE — Telephone Encounter (Signed)
Please get some more clinical info about this

## 2014-02-24 NOTE — Telephone Encounter (Signed)
Call in Valtrex 500 mg bid #20 with 5 rf

## 2014-02-24 NOTE — Telephone Encounter (Signed)
Onyeja pt hospice nurse call to say pt has develop a sore around the mouth and her lips are swollen . They would like to know what  Dr Sarajane Jews can rx the patient something to help clear this up .  Phone number for [facility] [phone]   [facility]; [facility] [address]

## 2014-02-24 NOTE — Telephone Encounter (Signed)
I spoke with pt's daughter Drue Dun and sent script e-scribe.

## 2014-02-24 NOTE — Telephone Encounter (Signed)
I spoke with Carla Friedman and she thinks pt had a core sore, family member put antibiotic ointment on it. Now lips are swollen and stinging.

## 2014-02-25 ENCOUNTER — Telehealth: Payer: Self-pay | Admitting: Family Medicine

## 2014-02-25 NOTE — Telephone Encounter (Signed)
Per Dr. Sarajane Jews, okay to see pt in the morning. If pt does get any worse or has any type of shortness of breath, then needs to go to the ER. I spoke with pt's daughter and pt did not complain of any SOB. She will bring pt here in the morning and if pt does get any worse she will take her to the ER. I did add Anbesol to pt's drug allergy list ( caused lips to swell ).

## 2014-02-25 NOTE — Telephone Encounter (Signed)
Patient Information:  Caller Name: Drue Dun  Phone: [phone]  Patient: Carla Friedman, Carla Friedman  Gender: Female  DOB: [birth year]  Age: 80 Years  PCP: Alysia Penna [facility])  Office Follow Up:  Does the office need to follow up with this patient?: No  Instructions For The Office: N/A  RN Note:  No appt. in the office today at [place] or [place] [facility] but daughter wants to wait and see Dr. Sarajane Jews on 02/26/14. Declines the ED. Appt. scheduled with Dr. Sarajane Jews on 02/26/14 at 09:30. If the pt. worsens, she will take her to UC.  Symptoms  Reason For Call & Symptoms: Lives in a Facility. Pt. had mouth lesion(02/24/14) and daughter got Ambesol for her.Had allergic reaction. Lips were swollen. Pt. in Hospice. Dr. Sarajane Jews called in Valtrex. Pt. is no better today(02/25/14). Now has rash on her chest and her throat. Itches.  Reviewed Health History In EMR: Yes  Reviewed Medications In EMR: Yes  Reviewed Allergies In EMR: Yes  Reviewed Surgeries / Procedures: Yes  Date of Onset of Symptoms: 02/24/2014  Treatments Tried: Valtrex  Treatments Tried Worked: No  Guideline(s) Used:  Rash - Widespread on Drugs - Drug Reaction  Rash or Redness - Localized  Disposition Per Guideline:   See Today in Office  Reason For Disposition Reached:   Patient wants to be seen  Advice Given:  Call Back If:  You become worse.  Patient Will Follow Care Advice:  YES  Appointment Scheduled:  02/26/2014 09:30:00 Appointment Scheduled Provider:  Alysia Penna [facility])

## 2014-02-26 ENCOUNTER — Ambulatory Visit (INDEPENDENT_AMBULATORY_CARE_PROVIDER_SITE_OTHER): Admitting: Family Medicine

## 2014-02-26 ENCOUNTER — Encounter: Payer: Self-pay | Admitting: Family Medicine

## 2014-02-26 VITALS — BP 126/56 | HR 84 | Temp 98.4°F | Ht 64.0 in | Wt 145.0 lb

## 2014-02-26 DIAGNOSIS — B009 Herpesviral infection, unspecified: Secondary | ICD-10-CM | POA: Diagnosis not present

## 2014-02-26 DIAGNOSIS — I2510 Atherosclerotic heart disease of native coronary artery without angina pectoris: Secondary | ICD-10-CM | POA: Diagnosis not present

## 2014-02-26 DIAGNOSIS — B001 Herpesviral vesicular dermatitis: Secondary | ICD-10-CM

## 2014-02-26 DIAGNOSIS — L509 Urticaria, unspecified: Secondary | ICD-10-CM

## 2014-02-26 NOTE — Progress Notes (Signed)
Pre visit review using our clinic review tool, if applicable. No additional management support is needed unless otherwise documented below in the visit note. 

## 2014-02-26 NOTE — Progress Notes (Signed)
   Subjective:    Patient ID: Carla Friedman, female    DOB: [birth year], 80 y.o.   MRN: [id number]  HPI Here with her daughter to check swollen lips and hives on the chest and abdomen. This all started about 3 days ago when she had an outbreak of cold sores around her entire mouth. She had not had any cold sores for years prior to this. Then they applied some Anbesol to her lips and within 24 hours her lips swelled up. Then we called in some Valtrex and by yesterday she also had some itchy red spots on the trunk. Today she feels better, the cold sores are fading away and the swelling in the lips has greatly reduced. She was never SOB.    Review of Systems  Constitutional: Negative.   Respiratory: Negative.   Cardiovascular: Negative.   Skin: Positive for rash.       Objective:   Physical Exam  Constitutional: She appears well-developed and well-nourished. No distress.  HENT:  Right Ear: External ear normal.  Left Ear: External ear normal.  Nose: Nose normal.  Both lips are mildly swollen and crusty   Eyes: Conjunctivae are normal. Pupils are equal, round, and reactive to light.  Pulmonary/Chest: Effort normal and breath sounds normal.  Skin:  Small patches of urticaria on the chest and lower abdomen           Assessment & Plan:  It seems that this all started with a severe outbreak of fever blisters around her mouth, and this then triggered a hives reaction. The fever blisters are responding to the Valtrex she is taking, so I encouraged her to finish out the 7 day course of this. As for the hives, she will take Zantac 150 mg bid and Claritin 10 mg bid for a week. Recheck prn

## 2014-03-24 DIAGNOSIS — R0602 Shortness of breath: Secondary | ICD-10-CM | POA: Diagnosis not present

## 2014-03-24 DIAGNOSIS — I2510 Atherosclerotic heart disease of native coronary artery without angina pectoris: Secondary | ICD-10-CM | POA: Diagnosis not present

## 2014-03-24 DIAGNOSIS — R059 Cough, unspecified: Secondary | ICD-10-CM | POA: Diagnosis not present

## 2014-03-24 DIAGNOSIS — E785 Hyperlipidemia, unspecified: Secondary | ICD-10-CM | POA: Diagnosis not present

## 2014-03-24 DIAGNOSIS — R413 Other amnesia: Secondary | ICD-10-CM | POA: Diagnosis not present

## 2014-03-24 DIAGNOSIS — R197 Diarrhea, unspecified: Secondary | ICD-10-CM | POA: Diagnosis not present

## 2014-03-24 DIAGNOSIS — I209 Angina pectoris, unspecified: Secondary | ICD-10-CM | POA: Diagnosis not present

## 2014-03-24 DIAGNOSIS — R630 Anorexia: Secondary | ICD-10-CM | POA: Diagnosis not present

## 2014-03-24 DIAGNOSIS — E1159 Type 2 diabetes mellitus with other circulatory complications: Secondary | ICD-10-CM | POA: Diagnosis not present

## 2014-03-24 DIAGNOSIS — I10 Essential (primary) hypertension: Secondary | ICD-10-CM | POA: Diagnosis not present

## 2014-03-30 ENCOUNTER — Other Ambulatory Visit: Payer: Self-pay | Admitting: Family Medicine

## 2014-04-23 DIAGNOSIS — I209 Angina pectoris, unspecified: Secondary | ICD-10-CM | POA: Diagnosis not present

## 2014-04-23 DIAGNOSIS — R630 Anorexia: Secondary | ICD-10-CM | POA: Diagnosis not present

## 2014-04-23 DIAGNOSIS — R0600 Dyspnea, unspecified: Secondary | ICD-10-CM | POA: Diagnosis not present

## 2014-04-23 DIAGNOSIS — E785 Hyperlipidemia, unspecified: Secondary | ICD-10-CM | POA: Diagnosis not present

## 2014-04-23 DIAGNOSIS — I25119 Atherosclerotic heart disease of native coronary artery with unspecified angina pectoris: Secondary | ICD-10-CM | POA: Diagnosis not present

## 2014-04-23 DIAGNOSIS — R413 Other amnesia: Secondary | ICD-10-CM | POA: Diagnosis not present

## 2014-04-23 DIAGNOSIS — R05 Cough: Secondary | ICD-10-CM | POA: Diagnosis not present

## 2014-04-23 DIAGNOSIS — R197 Diarrhea, unspecified: Secondary | ICD-10-CM | POA: Diagnosis not present

## 2014-04-23 DIAGNOSIS — E1159 Type 2 diabetes mellitus with other circulatory complications: Secondary | ICD-10-CM | POA: Diagnosis not present

## 2014-04-23 DIAGNOSIS — I150 Renovascular hypertension: Secondary | ICD-10-CM | POA: Diagnosis not present

## 2014-05-01 ENCOUNTER — Ambulatory Visit: Payer: Medicare Other | Admitting: Family Medicine

## 2014-05-01 DIAGNOSIS — Z23 Encounter for immunization: Secondary | ICD-10-CM | POA: Diagnosis not present

## 2014-05-24 DIAGNOSIS — R197 Diarrhea, unspecified: Secondary | ICD-10-CM | POA: Diagnosis not present

## 2014-05-24 DIAGNOSIS — I209 Angina pectoris, unspecified: Secondary | ICD-10-CM | POA: Diagnosis not present

## 2014-05-24 DIAGNOSIS — R05 Cough: Secondary | ICD-10-CM | POA: Diagnosis not present

## 2014-05-24 DIAGNOSIS — E785 Hyperlipidemia, unspecified: Secondary | ICD-10-CM | POA: Diagnosis not present

## 2014-05-24 DIAGNOSIS — I150 Renovascular hypertension: Secondary | ICD-10-CM | POA: Diagnosis not present

## 2014-05-24 DIAGNOSIS — R413 Other amnesia: Secondary | ICD-10-CM | POA: Diagnosis not present

## 2014-05-24 DIAGNOSIS — I25119 Atherosclerotic heart disease of native coronary artery with unspecified angina pectoris: Secondary | ICD-10-CM | POA: Diagnosis not present

## 2014-05-24 DIAGNOSIS — R0600 Dyspnea, unspecified: Secondary | ICD-10-CM | POA: Diagnosis not present

## 2014-05-24 DIAGNOSIS — R630 Anorexia: Secondary | ICD-10-CM | POA: Diagnosis not present

## 2014-05-24 DIAGNOSIS — E1159 Type 2 diabetes mellitus with other circulatory complications: Secondary | ICD-10-CM | POA: Diagnosis not present

## 2014-05-25 DIAGNOSIS — I25119 Atherosclerotic heart disease of native coronary artery with unspecified angina pectoris: Secondary | ICD-10-CM | POA: Diagnosis not present

## 2014-05-25 DIAGNOSIS — R05 Cough: Secondary | ICD-10-CM | POA: Diagnosis not present

## 2014-05-25 DIAGNOSIS — R630 Anorexia: Secondary | ICD-10-CM | POA: Diagnosis not present

## 2014-05-25 DIAGNOSIS — R0600 Dyspnea, unspecified: Secondary | ICD-10-CM | POA: Diagnosis not present

## 2014-05-25 DIAGNOSIS — I209 Angina pectoris, unspecified: Secondary | ICD-10-CM | POA: Diagnosis not present

## 2014-05-25 DIAGNOSIS — I150 Renovascular hypertension: Secondary | ICD-10-CM | POA: Diagnosis not present

## 2014-05-26 ENCOUNTER — Encounter: Payer: Self-pay | Admitting: Cardiovascular Disease

## 2014-05-26 DIAGNOSIS — I25119 Atherosclerotic heart disease of native coronary artery with unspecified angina pectoris: Secondary | ICD-10-CM | POA: Diagnosis not present

## 2014-05-26 DIAGNOSIS — R630 Anorexia: Secondary | ICD-10-CM | POA: Diagnosis not present

## 2014-05-26 DIAGNOSIS — I209 Angina pectoris, unspecified: Secondary | ICD-10-CM | POA: Diagnosis not present

## 2014-05-26 DIAGNOSIS — R05 Cough: Secondary | ICD-10-CM | POA: Diagnosis not present

## 2014-05-26 DIAGNOSIS — I150 Renovascular hypertension: Secondary | ICD-10-CM | POA: Diagnosis not present

## 2014-05-26 DIAGNOSIS — R0600 Dyspnea, unspecified: Secondary | ICD-10-CM | POA: Diagnosis not present

## 2014-05-29 DIAGNOSIS — I25119 Atherosclerotic heart disease of native coronary artery with unspecified angina pectoris: Secondary | ICD-10-CM | POA: Diagnosis not present

## 2014-05-29 DIAGNOSIS — R630 Anorexia: Secondary | ICD-10-CM | POA: Diagnosis not present

## 2014-05-29 DIAGNOSIS — I150 Renovascular hypertension: Secondary | ICD-10-CM | POA: Diagnosis not present

## 2014-05-29 DIAGNOSIS — R0600 Dyspnea, unspecified: Secondary | ICD-10-CM | POA: Diagnosis not present

## 2014-05-29 DIAGNOSIS — R05 Cough: Secondary | ICD-10-CM | POA: Diagnosis not present

## 2014-05-29 DIAGNOSIS — I209 Angina pectoris, unspecified: Secondary | ICD-10-CM | POA: Diagnosis not present

## 2014-06-01 DIAGNOSIS — R0600 Dyspnea, unspecified: Secondary | ICD-10-CM | POA: Diagnosis not present

## 2014-06-01 DIAGNOSIS — R05 Cough: Secondary | ICD-10-CM | POA: Diagnosis not present

## 2014-06-01 DIAGNOSIS — I25119 Atherosclerotic heart disease of native coronary artery with unspecified angina pectoris: Secondary | ICD-10-CM | POA: Diagnosis not present

## 2014-06-01 DIAGNOSIS — R630 Anorexia: Secondary | ICD-10-CM | POA: Diagnosis not present

## 2014-06-01 DIAGNOSIS — I150 Renovascular hypertension: Secondary | ICD-10-CM | POA: Diagnosis not present

## 2014-06-01 DIAGNOSIS — I209 Angina pectoris, unspecified: Secondary | ICD-10-CM | POA: Diagnosis not present

## 2014-06-02 DIAGNOSIS — I25119 Atherosclerotic heart disease of native coronary artery with unspecified angina pectoris: Secondary | ICD-10-CM | POA: Diagnosis not present

## 2014-06-02 DIAGNOSIS — I150 Renovascular hypertension: Secondary | ICD-10-CM | POA: Diagnosis not present

## 2014-06-02 DIAGNOSIS — R0600 Dyspnea, unspecified: Secondary | ICD-10-CM | POA: Diagnosis not present

## 2014-06-02 DIAGNOSIS — R05 Cough: Secondary | ICD-10-CM | POA: Diagnosis not present

## 2014-06-02 DIAGNOSIS — R630 Anorexia: Secondary | ICD-10-CM | POA: Diagnosis not present

## 2014-06-02 DIAGNOSIS — I209 Angina pectoris, unspecified: Secondary | ICD-10-CM | POA: Diagnosis not present

## 2014-06-04 ENCOUNTER — Encounter (HOSPITAL_COMMUNITY): Payer: Self-pay | Admitting: Emergency Medicine

## 2014-06-04 ENCOUNTER — Inpatient Hospital Stay (HOSPITAL_COMMUNITY)
Admission: EM | Admit: 2014-06-04 | Discharge: 2014-06-08 | DRG: 086 | Disposition: A | Discharge status: Skilled Nursing Facility | Attending: Internal Medicine | Admitting: Internal Medicine

## 2014-06-04 DIAGNOSIS — S065X9A Traumatic subdural hemorrhage with loss of consciousness of unspecified duration, initial encounter: Secondary | ICD-10-CM

## 2014-06-04 DIAGNOSIS — E871 Hypo-osmolality and hyponatremia: Secondary | ICD-10-CM | POA: Diagnosis present

## 2014-06-04 DIAGNOSIS — R630 Anorexia: Secondary | ICD-10-CM | POA: Diagnosis not present

## 2014-06-04 DIAGNOSIS — R05 Cough: Secondary | ICD-10-CM | POA: Diagnosis not present

## 2014-06-04 DIAGNOSIS — R0789 Other chest pain: Secondary | ICD-10-CM

## 2014-06-04 DIAGNOSIS — B001 Herpesviral vesicular dermatitis: Secondary | ICD-10-CM

## 2014-06-04 DIAGNOSIS — R079 Chest pain, unspecified: Secondary | ICD-10-CM | POA: Diagnosis not present

## 2014-06-04 DIAGNOSIS — S065X0A Traumatic subdural hemorrhage without loss of consciousness, initial encounter: Secondary | ICD-10-CM | POA: Diagnosis not present

## 2014-06-04 DIAGNOSIS — S0590XA Unspecified injury of unspecified eye and orbit, initial encounter: Secondary | ICD-10-CM | POA: Diagnosis not present

## 2014-06-04 DIAGNOSIS — I609 Nontraumatic subarachnoid hemorrhage, unspecified: Secondary | ICD-10-CM

## 2014-06-04 DIAGNOSIS — Y9301 Activity, walking, marching and hiking: Secondary | ICD-10-CM

## 2014-06-04 DIAGNOSIS — Z955 Presence of coronary angioplasty implant and graft: Secondary | ICD-10-CM

## 2014-06-04 DIAGNOSIS — S01119A Laceration without foreign body of unspecified eyelid and periocular area, initial encounter: Secondary | ICD-10-CM | POA: Diagnosis present

## 2014-06-04 DIAGNOSIS — I209 Angina pectoris, unspecified: Secondary | ICD-10-CM | POA: Diagnosis not present

## 2014-06-04 DIAGNOSIS — E785 Hyperlipidemia, unspecified: Secondary | ICD-10-CM | POA: Diagnosis present

## 2014-06-04 DIAGNOSIS — S065XAA Traumatic subdural hemorrhage with loss of consciousness status unknown, initial encounter: Secondary | ICD-10-CM | POA: Diagnosis present

## 2014-06-04 DIAGNOSIS — C4490 Unspecified malignant neoplasm of skin, unspecified: Secondary | ICD-10-CM

## 2014-06-04 DIAGNOSIS — Z7902 Long term (current) use of antithrombotics/antiplatelets: Secondary | ICD-10-CM

## 2014-06-04 DIAGNOSIS — R0600 Dyspnea, unspecified: Secondary | ICD-10-CM | POA: Diagnosis not present

## 2014-06-04 DIAGNOSIS — W19XXXA Unspecified fall, initial encounter: Secondary | ICD-10-CM

## 2014-06-04 DIAGNOSIS — I2510 Atherosclerotic heart disease of native coronary artery without angina pectoris: Secondary | ICD-10-CM | POA: Diagnosis present

## 2014-06-04 DIAGNOSIS — W109XXA Fall (on) (from) unspecified stairs and steps, initial encounter: Secondary | ICD-10-CM | POA: Diagnosis present

## 2014-06-04 DIAGNOSIS — I739 Peripheral vascular disease, unspecified: Secondary | ICD-10-CM | POA: Diagnosis present

## 2014-06-04 DIAGNOSIS — Z79899 Other long term (current) drug therapy: Secondary | ICD-10-CM

## 2014-06-04 DIAGNOSIS — E119 Type 2 diabetes mellitus without complications: Secondary | ICD-10-CM

## 2014-06-04 DIAGNOSIS — I150 Renovascular hypertension: Secondary | ICD-10-CM | POA: Diagnosis not present

## 2014-06-04 DIAGNOSIS — Z87891 Personal history of nicotine dependence: Secondary | ICD-10-CM

## 2014-06-04 DIAGNOSIS — E1140 Type 2 diabetes mellitus with diabetic neuropathy, unspecified: Secondary | ICD-10-CM | POA: Diagnosis present

## 2014-06-04 DIAGNOSIS — I25119 Atherosclerotic heart disease of native coronary artery with unspecified angina pectoris: Secondary | ICD-10-CM | POA: Diagnosis not present

## 2014-06-04 DIAGNOSIS — E039 Hypothyroidism, unspecified: Secondary | ICD-10-CM | POA: Diagnosis present

## 2014-06-04 DIAGNOSIS — Y92009 Unspecified place in unspecified non-institutional (private) residence as the place of occurrence of the external cause: Secondary | ICD-10-CM

## 2014-06-04 DIAGNOSIS — Z85828 Personal history of other malignant neoplasm of skin: Secondary | ICD-10-CM

## 2014-06-04 DIAGNOSIS — S066X0A Traumatic subarachnoid hemorrhage without loss of consciousness, initial encounter: Secondary | ICD-10-CM | POA: Diagnosis present

## 2014-06-04 DIAGNOSIS — R0602 Shortness of breath: Secondary | ICD-10-CM

## 2014-06-04 DIAGNOSIS — L509 Urticaria, unspecified: Secondary | ICD-10-CM

## 2014-06-04 DIAGNOSIS — I10 Essential (primary) hypertension: Secondary | ICD-10-CM | POA: Diagnosis present

## 2014-06-04 DIAGNOSIS — F0390 Unspecified dementia without behavioral disturbance: Secondary | ICD-10-CM | POA: Diagnosis present

## 2014-06-04 DIAGNOSIS — R413 Other amnesia: Secondary | ICD-10-CM | POA: Diagnosis present

## 2014-06-04 DIAGNOSIS — N390 Urinary tract infection, site not specified: Secondary | ICD-10-CM

## 2014-06-04 DIAGNOSIS — Z951 Presence of aortocoronary bypass graft: Secondary | ICD-10-CM

## 2014-06-04 DIAGNOSIS — Z7982 Long term (current) use of aspirin: Secondary | ICD-10-CM

## 2014-06-04 NOTE — ED Notes (Addendum)
Pt. tripped and fell at patio this evening , no LOC , presents with right periorbital swelling/bruise and laceration at right lateral eyebrow approx. 1/2 inch with minimal bleeding , right elbow swelling/bruise and right knee abrasion . Pt. takes Plavix daily. Reports headache and right eye pain .

## 2014-06-05 ENCOUNTER — Emergency Department (HOSPITAL_COMMUNITY)

## 2014-06-05 ENCOUNTER — Encounter (HOSPITAL_COMMUNITY): Payer: Self-pay

## 2014-06-05 DIAGNOSIS — M6281 Muscle weakness (generalized): Secondary | ICD-10-CM | POA: Diagnosis not present

## 2014-06-05 DIAGNOSIS — I609 Nontraumatic subarachnoid hemorrhage, unspecified: Secondary | ICD-10-CM

## 2014-06-05 DIAGNOSIS — B001 Herpesviral vesicular dermatitis: Secondary | ICD-10-CM | POA: Diagnosis not present

## 2014-06-05 DIAGNOSIS — S065X9A Traumatic subdural hemorrhage with loss of consciousness of unspecified duration, initial encounter: Secondary | ICD-10-CM | POA: Diagnosis present

## 2014-06-05 DIAGNOSIS — I739 Peripheral vascular disease, unspecified: Secondary | ICD-10-CM | POA: Diagnosis not present

## 2014-06-05 DIAGNOSIS — Z79899 Other long term (current) drug therapy: Secondary | ICD-10-CM | POA: Diagnosis not present

## 2014-06-05 DIAGNOSIS — Z7902 Long term (current) use of antithrombotics/antiplatelets: Secondary | ICD-10-CM | POA: Diagnosis not present

## 2014-06-05 DIAGNOSIS — G8929 Other chronic pain: Secondary | ICD-10-CM | POA: Diagnosis not present

## 2014-06-05 DIAGNOSIS — I200 Unstable angina: Secondary | ICD-10-CM | POA: Diagnosis not present

## 2014-06-05 DIAGNOSIS — R630 Anorexia: Secondary | ICD-10-CM | POA: Diagnosis not present

## 2014-06-05 DIAGNOSIS — I150 Renovascular hypertension: Secondary | ICD-10-CM | POA: Diagnosis not present

## 2014-06-05 DIAGNOSIS — Z87891 Personal history of nicotine dependence: Secondary | ICD-10-CM | POA: Diagnosis not present

## 2014-06-05 DIAGNOSIS — K5900 Constipation, unspecified: Secondary | ICD-10-CM | POA: Diagnosis not present

## 2014-06-05 DIAGNOSIS — Z7982 Long term (current) use of aspirin: Secondary | ICD-10-CM | POA: Diagnosis not present

## 2014-06-05 DIAGNOSIS — E119 Type 2 diabetes mellitus without complications: Secondary | ICD-10-CM

## 2014-06-05 DIAGNOSIS — R488 Other symbolic dysfunctions: Secondary | ICD-10-CM | POA: Diagnosis not present

## 2014-06-05 DIAGNOSIS — Z951 Presence of aortocoronary bypass graft: Secondary | ICD-10-CM | POA: Diagnosis not present

## 2014-06-05 DIAGNOSIS — S066X0A Traumatic subarachnoid hemorrhage without loss of consciousness, initial encounter: Secondary | ICD-10-CM | POA: Diagnosis present

## 2014-06-05 DIAGNOSIS — S01119A Laceration without foreign body of unspecified eyelid and periocular area, initial encounter: Secondary | ICD-10-CM | POA: Diagnosis present

## 2014-06-05 DIAGNOSIS — I2510 Atherosclerotic heart disease of native coronary artery without angina pectoris: Secondary | ICD-10-CM | POA: Diagnosis not present

## 2014-06-05 DIAGNOSIS — Z85828 Personal history of other malignant neoplasm of skin: Secondary | ICD-10-CM | POA: Diagnosis not present

## 2014-06-05 DIAGNOSIS — E871 Hypo-osmolality and hyponatremia: Secondary | ICD-10-CM | POA: Diagnosis present

## 2014-06-05 DIAGNOSIS — E1140 Type 2 diabetes mellitus with diabetic neuropathy, unspecified: Secondary | ICD-10-CM | POA: Diagnosis present

## 2014-06-05 DIAGNOSIS — E568 Deficiency of other vitamins: Secondary | ICD-10-CM | POA: Diagnosis not present

## 2014-06-05 DIAGNOSIS — R279 Unspecified lack of coordination: Secondary | ICD-10-CM | POA: Diagnosis not present

## 2014-06-05 DIAGNOSIS — S065XAA Traumatic subdural hemorrhage with loss of consciousness status unknown, initial encounter: Secondary | ICD-10-CM | POA: Diagnosis present

## 2014-06-05 DIAGNOSIS — E039 Hypothyroidism, unspecified: Secondary | ICD-10-CM | POA: Diagnosis present

## 2014-06-05 DIAGNOSIS — F0390 Unspecified dementia without behavioral disturbance: Secondary | ICD-10-CM | POA: Diagnosis present

## 2014-06-05 DIAGNOSIS — R05 Cough: Secondary | ICD-10-CM | POA: Diagnosis not present

## 2014-06-05 DIAGNOSIS — I10 Essential (primary) hypertension: Secondary | ICD-10-CM | POA: Diagnosis not present

## 2014-06-05 DIAGNOSIS — R0789 Other chest pain: Secondary | ICD-10-CM | POA: Diagnosis not present

## 2014-06-05 DIAGNOSIS — I6200 Nontraumatic subdural hemorrhage, unspecified: Secondary | ICD-10-CM

## 2014-06-05 DIAGNOSIS — R296 Repeated falls: Secondary | ICD-10-CM | POA: Diagnosis not present

## 2014-06-05 DIAGNOSIS — R413 Other amnesia: Secondary | ICD-10-CM | POA: Diagnosis not present

## 2014-06-05 DIAGNOSIS — Y92009 Unspecified place in unspecified non-institutional (private) residence as the place of occurrence of the external cause: Secondary | ICD-10-CM | POA: Diagnosis not present

## 2014-06-05 DIAGNOSIS — R110 Nausea: Secondary | ICD-10-CM | POA: Diagnosis not present

## 2014-06-05 DIAGNOSIS — Z955 Presence of coronary angioplasty implant and graft: Secondary | ICD-10-CM | POA: Diagnosis not present

## 2014-06-05 DIAGNOSIS — E785 Hyperlipidemia, unspecified: Secondary | ICD-10-CM | POA: Diagnosis not present

## 2014-06-05 DIAGNOSIS — F338 Other recurrent depressive disorders: Secondary | ICD-10-CM | POA: Diagnosis not present

## 2014-06-05 DIAGNOSIS — R0600 Dyspnea, unspecified: Secondary | ICD-10-CM | POA: Diagnosis not present

## 2014-06-05 DIAGNOSIS — S065X0A Traumatic subdural hemorrhage without loss of consciousness, initial encounter: Secondary | ICD-10-CM | POA: Diagnosis not present

## 2014-06-05 DIAGNOSIS — Y9301 Activity, walking, marching and hiking: Secondary | ICD-10-CM | POA: Diagnosis not present

## 2014-06-05 DIAGNOSIS — N390 Urinary tract infection, site not specified: Secondary | ICD-10-CM | POA: Diagnosis present

## 2014-06-05 DIAGNOSIS — W109XXA Fall (on) (from) unspecified stairs and steps, initial encounter: Secondary | ICD-10-CM | POA: Diagnosis present

## 2014-06-05 DIAGNOSIS — I209 Angina pectoris, unspecified: Secondary | ICD-10-CM | POA: Diagnosis not present

## 2014-06-05 DIAGNOSIS — I25119 Atherosclerotic heart disease of native coronary artery with unspecified angina pectoris: Secondary | ICD-10-CM | POA: Diagnosis not present

## 2014-06-05 LAB — BASIC METABOLIC PANEL
Anion gap: 13 (ref 5–15)
Anion gap: 12 (ref 5–15)
Anion gap: 12 (ref 5–15)
BUN: 12 mg/dL (ref 6–23)
BUN: 12 mg/dL (ref 6–23)
BUN: 12 mg/dL (ref 6–23)
CALCIUM: 8.6 mg/dL (ref 8.4–10.5)
CALCIUM: 8.8 mg/dL (ref 8.4–10.5)
CO2: 23 mEq/L (ref 19–32)
CO2: 22 meq/L (ref 19–32)
CO2: 23 meq/L (ref 19–32)
CREATININE: 0.94 mg/dL (ref 0.50–1.10)
Calcium: 8.6 mg/dL (ref 8.4–10.5)
Chloride: 90 mEq/L — ABNORMAL LOW (ref 96–112)
Chloride: 91 mEq/L — ABNORMAL LOW (ref 96–112)
Chloride: 91 mEq/L — ABNORMAL LOW (ref 96–112)
Creatinine, Ser: 0.94 mg/dL (ref 0.50–1.10)
Creatinine, Ser: 0.98 mg/dL (ref 0.50–1.10)
GFR calc Af Amer: 66 mL/min — ABNORMAL LOW (ref >90)
GFR calc Af Amer: 66 mL/min — ABNORMAL LOW (ref >90)
GFR calc Af Amer: 63 mL/min — ABNORMAL LOW (ref >90)
GFR calc non Af Amer: 54 mL/min — ABNORMAL LOW (ref >90)
GFR, EST NON AFRICAN AMERICAN: 57 mL/min — AB (ref >90)
GFR, EST NON AFRICAN AMERICAN: 57 mL/min — AB (ref >90)
GLUCOSE: 151 mg/dL — AB (ref 70–99)
Glucose, Bld: 151 mg/dL — ABNORMAL HIGH (ref 70–99)
Glucose, Bld: 136 mg/dL — ABNORMAL HIGH (ref 70–99)
POTASSIUM: 4.4 meq/L (ref 3.7–5.3)
Potassium: 4.7 mEq/L (ref 3.7–5.3)
Potassium: 5.1 mEq/L (ref 3.7–5.3)
SODIUM: 126 meq/L — AB (ref 137–147)
SODIUM: 125 meq/L — AB (ref 137–147)
SODIUM: 126 meq/L — AB (ref 137–147)

## 2014-06-05 LAB — CBC WITH DIFFERENTIAL/PLATELET
BASOS PCT: 0 % (ref 0–1)
Basophils Absolute: 0 10*3/uL (ref 0.0–0.1)
EOS ABS: 0 10*3/uL (ref 0.0–0.7)
Eosinophils Relative: 0 % (ref 0–5)
HCT: 34.2 % — ABNORMAL LOW (ref 36.0–46.0)
Hemoglobin: 11.8 g/dL — ABNORMAL LOW (ref 12.0–15.0)
Lymphocytes Relative: 6 % — ABNORMAL LOW (ref 12–46)
Lymphs Abs: 1.1 10*3/uL (ref 0.7–4.0)
MCH: 29.5 pg (ref 26.0–34.0)
MCHC: 34.5 g/dL (ref 30.0–36.0)
MCV: 85.5 fL (ref 78.0–100.0)
Monocytes Absolute: 0.6 10*3/uL (ref 0.1–1.0)
Monocytes Relative: 3 % (ref 3–12)
NEUTROS PCT: 91 % — AB (ref 43–77)
Neutro Abs: 16.8 10*3/uL — ABNORMAL HIGH (ref 1.7–7.7)
PLATELETS: 276 10*3/uL (ref 150–400)
RBC: 4 MIL/uL (ref 3.87–5.11)
RDW: 12.5 % (ref 11.5–15.5)
WBC: 18.5 10*3/uL — ABNORMAL HIGH (ref 4.0–10.5)

## 2014-06-05 LAB — URINE MICROSCOPIC-ADD ON

## 2014-06-05 LAB — COMPREHENSIVE METABOLIC PANEL
ALBUMIN: 3.6 g/dL (ref 3.5–5.2)
ALK PHOS: 58 U/L (ref 39–117)
ALT: 10 U/L (ref 0–35)
ALT: 11 U/L (ref 0–35)
ANION GAP: 12 (ref 5–15)
AST: 15 U/L (ref 0–37)
AST: 13 U/L (ref 0–37)
Albumin: 3.2 g/dL — ABNORMAL LOW (ref 3.5–5.2)
Alkaline Phosphatase: 49 U/L (ref 39–117)
Anion gap: 18 — ABNORMAL HIGH (ref 5–15)
BUN: 14 mg/dL (ref 6–23)
BUN: 13 mg/dL (ref 6–23)
CALCIUM: 8.8 mg/dL (ref 8.4–10.5)
CO2: 20 mEq/L (ref 19–32)
CO2: 25 mEq/L (ref 19–32)
CREATININE: 0.97 mg/dL (ref 0.50–1.10)
Calcium: 9.4 mg/dL (ref 8.4–10.5)
Chloride: 87 mEq/L — ABNORMAL LOW (ref 96–112)
Chloride: 91 mEq/L — ABNORMAL LOW (ref 96–112)
Creatinine, Ser: 1.05 mg/dL (ref 0.50–1.10)
GFR calc Af Amer: 58 mL/min — ABNORMAL LOW (ref >90)
GFR calc Af Amer: 64 mL/min — ABNORMAL LOW (ref >90)
GFR calc non Af Amer: 50 mL/min — ABNORMAL LOW (ref >90)
GFR calc non Af Amer: 55 mL/min — ABNORMAL LOW (ref >90)
Glucose, Bld: 242 mg/dL — ABNORMAL HIGH (ref 70–99)
Glucose, Bld: 168 mg/dL — ABNORMAL HIGH (ref 70–99)
POTASSIUM: 4.4 meq/L (ref 3.7–5.3)
Potassium: 4.7 mEq/L (ref 3.7–5.3)
SODIUM: 125 meq/L — AB (ref 137–147)
Sodium: 128 mEq/L — ABNORMAL LOW (ref 137–147)
TOTAL PROTEIN: 6.6 g/dL (ref 6.0–8.3)
Total Bilirubin: 0.4 mg/dL (ref 0.3–1.2)
Total Bilirubin: 0.4 mg/dL (ref 0.3–1.2)
Total Protein: 5.7 g/dL — ABNORMAL LOW (ref 6.0–8.3)

## 2014-06-05 LAB — CBG MONITORING, ED
Glucose-Capillary: 173 mg/dL — ABNORMAL HIGH (ref 70–99)
Glucose-Capillary: 148 mg/dL — ABNORMAL HIGH (ref 70–99)

## 2014-06-05 LAB — CBC
HEMATOCRIT: 29.2 % — AB (ref 36.0–46.0)
Hemoglobin: 10.4 g/dL — ABNORMAL LOW (ref 12.0–15.0)
MCH: 29.5 pg (ref 26.0–34.0)
MCHC: 35.6 g/dL (ref 30.0–36.0)
MCV: 83 fL (ref 78.0–100.0)
PLATELETS: 278 10*3/uL (ref 150–400)
RBC: 3.52 MIL/uL — AB (ref 3.87–5.11)
RDW: 12.6 % (ref 11.5–15.5)
WBC: 14.8 10*3/uL — ABNORMAL HIGH (ref 4.0–10.5)

## 2014-06-05 LAB — I-STAT CG4 LACTIC ACID, ED: LACTIC ACID, VENOUS: 1.78 mmol/L (ref 0.5–2.2)

## 2014-06-05 LAB — URINALYSIS, ROUTINE W REFLEX MICROSCOPIC
Bilirubin Urine: NEGATIVE
GLUCOSE, UA: NEGATIVE mg/dL
Hgb urine dipstick: NEGATIVE
KETONES UR: 15 mg/dL — AB
NITRITE: NEGATIVE
Protein, ur: NEGATIVE mg/dL
Specific Gravity, Urine: 1.017 (ref 1.005–1.030)
Urobilinogen, UA: 0.2 mg/dL (ref 0.0–1.0)
pH: 6 (ref 5.0–8.0)

## 2014-06-05 LAB — BASIC METABOLIC PANEL WITH GFR
Anion gap: 12 (ref 5–15)
BUN: 13 mg/dL (ref 6–23)
CO2: 23 meq/L (ref 19–32)
Calcium: 8.9 mg/dL (ref 8.4–10.5)
Chloride: 91 meq/L — ABNORMAL LOW (ref 96–112)
Creatinine, Ser: 0.91 mg/dL (ref 0.50–1.10)
GFR calc Af Amer: 69 mL/min — ABNORMAL LOW
GFR calc non Af Amer: 59 mL/min — ABNORMAL LOW
Glucose, Bld: 143 mg/dL — ABNORMAL HIGH (ref 70–99)
Potassium: 4.8 meq/L (ref 3.7–5.3)
Sodium: 126 meq/L — ABNORMAL LOW (ref 137–147)

## 2014-06-05 LAB — OSMOLALITY, URINE: Osmolality, Ur: 453 mOsm/kg (ref 390–1090)

## 2014-06-05 LAB — HEMOGLOBIN A1C
HEMOGLOBIN A1C: 6.1 % — AB (ref <5.7)
MEAN PLASMA GLUCOSE: 128 mg/dL — AB (ref <117)

## 2014-06-05 LAB — PROTIME-INR
INR: 1.05 (ref 0.00–1.49)
INR: 1.11 (ref 0.00–1.49)
Prothrombin Time: 13.9 seconds (ref 11.6–15.2)
Prothrombin Time: 14.5 s (ref 11.6–15.2)

## 2014-06-05 LAB — TSH: TSH: 0.019 u[IU]/mL — ABNORMAL LOW (ref 0.350–4.500)

## 2014-06-05 LAB — LIPASE, BLOOD: Lipase: 14 U/L (ref 11–59)

## 2014-06-05 LAB — I-STAT TROPONIN, ED: Troponin i, poc: 0.02 ng/mL (ref 0.00–0.08)

## 2014-06-05 LAB — TROPONIN I

## 2014-06-05 LAB — SODIUM, URINE, RANDOM: Sodium, Ur: 42 mEq/L

## 2014-06-05 LAB — OSMOLALITY: Osmolality: 265 mOsm/kg — ABNORMAL LOW (ref 275–300)

## 2014-06-05 MED ORDER — RANOLAZINE ER 500 MG PO TB12
500.0000 mg | ORAL_TABLET | Freq: Two times a day (BID) | ORAL | Status: DC
  Administered 2014-06-05 – 2014-06-08 (×7): 500 mg via ORAL
  Filled 2014-06-05 (×8): qty 1

## 2014-06-05 MED ORDER — ACETAMINOPHEN 650 MG RE SUPP: 650.0000 mg | Freq: Four times a day (QID) | RECTAL | Status: DC | PRN

## 2014-06-05 MED ORDER — ONDANSETRON HCL 4 MG/2ML IJ SOLN
4.0000 mg | Freq: Four times a day (QID) | INTRAMUSCULAR | Status: DC | PRN
  Administered 2014-06-05: 4 mg via INTRAVENOUS
  Administered 2014-06-05: 8 mg via INTRAVENOUS
  Filled 2014-06-05 (×2): qty 2

## 2014-06-05 MED ORDER — AMITRIPTYLINE HCL 25 MG PO TABS
50.0000 mg | ORAL_TABLET | Freq: Every day | ORAL | Status: DC
  Administered 2014-06-05 – 2014-06-07 (×3): 50 mg via ORAL
  Filled 2014-06-05 (×3): qty 2

## 2014-06-05 MED ORDER — LIDOCAINE-EPINEPHRINE 1 %-1:100000 IJ SOLN
10.0000 mL | Freq: Once | INTRAMUSCULAR | Status: AC
  Administered 2014-06-05: 10 mL via INTRADERMAL
  Filled 2014-06-05: qty 1

## 2014-06-05 MED ORDER — GABAPENTIN 100 MG PO CAPS
100.0000 mg | ORAL_CAPSULE | Freq: Two times a day (BID) | ORAL | Status: DC
  Administered 2014-06-05 – 2014-06-08 (×7): 100 mg via ORAL
  Filled 2014-06-05 (×8): qty 1

## 2014-06-05 MED ORDER — ISOSORBIDE DINITRATE 30 MG PO TABS
30.0000 mg | ORAL_TABLET | Freq: Two times a day (BID) | ORAL | Status: DC
  Administered 2014-06-05 – 2014-06-08 (×7): 30 mg via ORAL
  Filled 2014-06-05 (×10): qty 1

## 2014-06-05 MED ORDER — SODIUM CHLORIDE 0.9 % IV BOLUS (SEPSIS)
1000.0000 mL | Freq: Once | INTRAVENOUS | Status: AC
  Administered 2014-06-05: 1000 mL via INTRAVENOUS

## 2014-06-05 MED ORDER — ONDANSETRON HCL 4 MG PO TABS: 4.0000 mg | ORAL_TABLET | Freq: Four times a day (QID) | ORAL | Status: DC | PRN

## 2014-06-05 MED ORDER — SODIUM CHLORIDE 0.9 % IV SOLN
INTRAVENOUS | Status: DC
  Administered 2014-06-05 – 2014-06-07 (×3): via INTRAVENOUS

## 2014-06-05 MED ORDER — OXYCODONE HCL 5 MG PO TABS: 5.0000 mg | ORAL_TABLET | ORAL | Status: DC | PRN

## 2014-06-05 MED ORDER — TETANUS-DIPHTH-ACELL PERTUSSIS 5-2.5-18.5 LF-MCG/0.5 IM SUSP
0.5000 mL | Freq: Once | INTRAMUSCULAR | Status: AC
  Administered 2014-06-05: 0.5 mL via INTRAMUSCULAR
  Filled 2014-06-05: qty 0.5

## 2014-06-05 MED ORDER — RAMIPRIL 5 MG PO CAPS
10.0000 mg | ORAL_CAPSULE | Freq: Every day | ORAL | Status: DC
  Administered 2014-06-05 – 2014-06-08 (×4): 10 mg via ORAL
  Filled 2014-06-05 (×2): qty 2
  Filled 2014-06-05: qty 1
  Filled 2014-06-05: qty 2

## 2014-06-05 MED ORDER — NITROGLYCERIN 0.4 MG SL SUBL: 0.4000 mg | SUBLINGUAL_TABLET | SUBLINGUAL | Status: DC | PRN

## 2014-06-05 MED ORDER — ONDANSETRON HCL 4 MG/2ML IJ SOLN
4.0000 mg | Freq: Once | INTRAMUSCULAR | Status: AC
  Administered 2014-06-05: 4 mg via INTRAVENOUS
  Filled 2014-06-05: qty 2

## 2014-06-05 MED ORDER — DEXTROSE 5 % IV SOLN
1.0000 g | Freq: Once | INTRAVENOUS | Status: AC
  Administered 2014-06-05: 1 g via INTRAVENOUS
  Filled 2014-06-05: qty 10

## 2014-06-05 MED ORDER — ONDANSETRON 8 MG/NS 50 ML IVPB
8.0000 mg | Freq: Once | INTRAVENOUS | Status: DC
  Filled 2014-06-05: qty 8

## 2014-06-05 MED ORDER — PROMETHAZINE HCL 25 MG/ML IJ SOLN
12.5000 mg | Freq: Four times a day (QID) | INTRAMUSCULAR | Status: DC | PRN
  Filled 2014-06-05: qty 1

## 2014-06-05 MED ORDER — PROMETHAZINE HCL 25 MG PO TABS
25.0000 mg | ORAL_TABLET | Freq: Four times a day (QID) | ORAL | Status: DC | PRN
  Administered 2014-06-05: 25 mg via ORAL
  Filled 2014-06-05 (×2): qty 1

## 2014-06-05 MED ORDER — ACETAMINOPHEN 325 MG PO TABS: 650.0000 mg | ORAL_TABLET | Freq: Four times a day (QID) | ORAL | Status: DC | PRN

## 2014-06-05 MED ORDER — SODIUM CHLORIDE 0.9 % IJ SOLN
3.0000 mL | Freq: Two times a day (BID) | INTRAMUSCULAR | Status: DC
  Administered 2014-06-06 – 2014-06-07 (×3): 3 mL via INTRAVENOUS

## 2014-06-05 MED ORDER — ATENOLOL 25 MG PO TABS
50.0000 mg | ORAL_TABLET | ORAL | Status: DC
  Administered 2014-06-05 – 2014-06-07 (×3): 50 mg via ORAL
  Filled 2014-06-05 (×4): qty 2

## 2014-06-05 MED ORDER — OXYCODONE HCL 5 MG PO TABS
5.0000 mg | ORAL_TABLET | Freq: Once | ORAL | Status: AC
  Administered 2014-06-05: 5 mg via ORAL
  Filled 2014-06-05: qty 1

## 2014-06-05 MED ORDER — DEXTROSE 5 % IV SOLN
1.0000 g | INTRAVENOUS | Status: DC
  Administered 2014-06-06 – 2014-06-08 (×3): 1 g via INTRAVENOUS
  Filled 2014-06-05 (×3): qty 10

## 2014-06-05 MED ORDER — PRAVASTATIN SODIUM 40 MG PO TABS
40.0000 mg | ORAL_TABLET | Freq: Every evening | ORAL | Status: DC
  Administered 2014-06-05 – 2014-06-07 (×3): 40 mg via ORAL
  Filled 2014-06-05 (×5): qty 1

## 2014-06-05 NOTE — ED Notes (Signed)
Pt nauseated and vomiting slightly, pt medicated

## 2014-06-05 NOTE — Clinical Social Work Placement (Addendum)
Clinical Social Work Department CLINICAL SOCIAL WORK PLACEMENT NOTE 06/05/2014  Patient:  Carla Friedman, Carla Friedman  Account Number:  [id number] Admit date:  06/04/2014  Clinical Social Worker:  Greta Doom, LCSWA  Date/time:  06/05/2014 05:31 PM  Clinical Social Work is seeking post-discharge placement for this patient at the following level of care:   SKILLED NURSING   (*CSW will update this form in Epic as items are completed)   06/05/2014  Patient/family provided with [place] Department of Clinical Social Work's list of facilities offering this level of care within the geographic area requested by the patient (or if unable, by the patient's family).  06/05/2014  Patient/family informed of their freedom to choose among providers that offer the needed level of care, that participate in Medicare, Medicaid or managed care program needed by the patient, have an available bed and are willing to accept the patient.  06/05/2014  Patient/family informed of [facility]' ownership interest in [facility], as well as of the fact that they are under no obligation to receive care at this facility.  PASARR submitted to EDS on 06/05/2014 PASARR number received on 06/05/2014  FL2 transmitted to all facilities in geographic area requested by pt/family on  06/05/2014 FL2 transmitted to all facilities within larger geographic area on 06/05/2014  Patient informed that his/her managed care company has contracts with or will negotiate with  certain facilities, including the following:     Patient/family informed of bed offers received:  06/08/2014 Patient chooses bed at [facility]  Physician recommends and patient chooses bed at    Patient to be transferred to [facility]  on  06/08/2014 Patient to be transferred to facility by Daughter Drue Dun Patient and family notified of transfer on 06/08/2014 Name of family member notified:  Drue Dun   The following physician request were entered in  Epic:   Additional Comments:  King, MSW, [place]

## 2014-06-05 NOTE — Progress Notes (Signed)
Patient seen in the ER, admitted few hours ago.  She was at home with home hospice for advanced CAD. She's been getting gradually weak and had a mechanical fall at home. Had right for head laceration and a large right periorbital hematoma along with a subdural hematoma. She was on Plavix which has been held.  She also has a UTI for which she is on appropriate antibiotics.   Hyponatremia. Decreased by mouth intake, gentle hydration, check urine sodium osmolarity and serum osmolarity.   Discussed her care in detail with daughter at bedside. She is a DO NOT RESUSCITATE. They do not want to pursue any heroics and just supportive care. They are open to PT and placement if she qualifies. PT and social work consult placed. Fall precautions ambulate with assistance.

## 2014-06-05 NOTE — Progress Notes (Signed)
Inpatient RN visit- Carla Friedman [facility] ED Room C 29-[facility] RN Visit-Karen [place] RN  Related admission to [facility] diagnosis of Heart Disease.  Pt is DNR code.  Pt seen in the ED , is scheduled to be admitted, H+P done, pt awaiting bed assignment. Pt seen lying on the ED stretcher, alert, oriented to place, time and situation, some mild confusion/forgetfulness noted. Daughter Carla Friedman and [facility] LCSW Carla Friedman present during visit. Pt able to recall the events that led to her fall. No loss of consciousness. Periorbital swelling and ecchymosis to R eye, three sutures in place above right eye. Pt also noted with significant swelling and ecchymosis to right elbow. She denied pain. Last pain medication given at 1:51am, Oxy IR 5mg . per patient and her daughter she has had nausea and has vomited several times since the fall.  Per chart review She has required 3 doses of IV Zofran 4mg  and a phenergan tablet since arrival to the ED.  Pt denied nausea during visit, was able to sip water. Pt denied pain. Pt lives alone in a self care adult living apt, and per [facility] LCSW and [facility] physician Dr. Lyman Speller  has had a steady decline functional  over the past few months.  Carla Friedman voiced appreciation for RN visit and voiced understanding that [facility] will follow daily.  Patient's home medication list is on shadow chart.  Please call [facility] @ [phone]-  with any hospice needs.   Thank you. Tracey Harries, RN  [facility]  Hospice Liaison  [phone])

## 2014-06-05 NOTE — ED Notes (Signed)
Pt c/o of fall this afternoon. Reports ambulating with boyfriend when she tripped and fell face first. Large hematoma noted to R eye; states "It feels like something is pulling behind my eye." Eye swollen shut, unable to look down. Daughter reports pt is a hospice patient. Pt has had decreased appetite, dizziness, and lightheadedness since Sunday associated with NV. Emesis is "bright red." Pt also had an episode on Sunday and today of heaviness in chest and belching. Denies chest pain at present. Bruising also noted to R elbow and abrasion to R knee.

## 2014-06-05 NOTE — Clinical Social Work Psychosocial (Signed)
Clinical Social Work Department BRIEF PSYCHOSOCIAL ASSESSMENT 06/05/2014  Patient:  Carla Friedman, Carla Friedman     Account Number:  [id number]     Admit date:  06/04/2014  Clinical Social Worker:  Marciano Sequin  Date/Time:  06/05/2014 05:30 PM  Referred by:  CSW  Date Referred:  06/05/2014 Referred for  SNF Placement   Other Referral:   Interview type:  Family Other interview type:   Pt is confused. Carla Friedman,Carla Friedman Daughter [phone] or [phone]    PSYCHOSOCIAL DATA Living Status:  FACILITY Admitted from facility:  [facility] Level of care:  Independent Living Primary support name:  Carla Friedman,Carla Friedman Primary support relationship to patient:  CHILD, ADULT Degree of support available:   Strong Support    CURRENT CONCERNS  Other Concerns:    SOCIAL WORK ASSESSMENT / PLAN CSW met pt and pt's daughter Carla Friedman at bedside. CSW introduced self and purpose of the visit. Carla Friedman reported that the pt is under the care of [facility] while she is living at [facility]. Carla Friedman reported that she wants the pt to return back home if it is possible. Carla Friedman reported understanding if the clinical recommendations maybe SNF. CSW explained the SNF process to Carla Friedman. Carla Friedman reported that she will update the [facility] regarding the pt's condition. CSW reviewed the SNF list with Carla Friedman. CSW provided [facility] with contact information for further questions. CSW will continue to follow this pt and assist with discharge as needed.   Assessment/plan status:  Psychosocial Support/Ongoing Assessment of Needs Other assessment/ plan:   Information/referral to community resources:    PATIENT'S/FAMILY'S RESPONSE TO PLAN OF CARE: Pt's daughter reported a significant change in the pt's overall health and mental status. Pt's daughter was receptive to the clinical recommendations.     [place], MSW, [place]

## 2014-06-05 NOTE — Progress Notes (Signed)
[facility] ([facility] Sw note:  Sw met with [name] who was at Pt's bedside, both gave report of Pt's fall at home. PTA Pt lived in her own apartment at [facility] which is an older adult apartment community, which provides no support to residents. Carla Friedman calls/visits Pt and fills her med box but Pt is alone most of the time.  Pt has been under [facility] services for several years related to her heart issues. Pt has been dealing with heart pain requiring nitro, mental status changes with forgetfulness, weakness in that she stays in her apartment most of the time leaving only for meals and even then at times she will not go to meals because she is too weak to make it to the dining room. Family unsure of disposition plans at this time and understand that hospital Sw will be in charge of this planning. Sw provided active/reflective listening, counseling to normalize feelings expressed. This Sw will continue to follow for support/education during this hospital stay.  [place], [place]

## 2014-06-05 NOTE — ED Notes (Signed)
Dr Candiss Norse came out of the room and gave a verbal order for Zofran 8mg  one time.

## 2014-06-05 NOTE — Consult Note (Addendum)
  Family known to me. Patient fell at home.hit her head and elbow. No loc. Daughter applied pressure to the right face to stop bleeding. Then she started to vomit and was brought to the ER. CT  Head done. Clinically she is stable. C/o of pain in the elbow.no headache. Clinically, right raccon eye with periorbital swelling. No evidence of csf coming from nose or ears. No battle sign. Oriented x 3. CN  Left pupil 45mmm, err. Left fom. No facial weakness ,except for left facial swelling. Able to move all 4 extremities.  Sensory, nl.  Ct head small sdh. No shift. Ct spine shows ddd Idid speak to her daughter at length including home supervision, her history of angina,plavix. i did reassure that brain wise she is ok but we will repeat the head ct in 24 hours. We will follow

## 2014-06-05 NOTE — ED Provider Notes (Signed)
CSN: [id number]     Arrival date & time 06/04/14  2340 History   First MD Initiated Contact with Patient 06/05/14 0002     Chief Complaint  Patient presents with  . Fall     (Consider location/radiation/quality/duration/timing/severity/associated sxs/prior Treatment) HPI  Carla Friedman is a 80 y.o. female with past medical history of diabetes, hypertension, hyperlipidemia, coronary artery disease presenting today after a fall. This occurred a meal he prior to arrival. Patient missed her step going into her home, she fell and landed face first on her right side. She has tenderness around her right eye and also at her right elbow. Patient does take aspirin and Plavix every day, and large hematomas are present. She denies feeling dizzy or lightheaded prior to the fall. She had no prodromal symptoms. She is currently on Bactrim for UTI since last Friday. This medication however has made her nauseous and vomiting and she has not been eating well. Per the daughter who is in the room patient has also been experiencing chest pain intermittently over the last week. This is been relieved with nitroglycerin, she has felt short of breath as well. Patient has not sought medical care for this.  10 Systems reviewed and are negative for acute change except as noted in the HPI.     Past Medical History  Diagnosis Date  . Coronary artery disease     sees Dr. Johnsie Cancel, in Hospice for this   . Chest pain, atypical   . PVD (peripheral vascular disease)   . Hypertension   . Dyslipidemia   . Diabetes mellitus type II   . Peptic ulcer disease   . Depression   . Diabetic neuropathy   . Hypothyroidism   . Back pain     low  . Cancer, skin, squamous cell     sees Dr. Lois Huxley at [facility] [place] dermatology  . Confusion    Past Surgical History  Procedure Laterality Date  . Appendectomy    . Tonsillectomy    . Coronary artery bypass graft      stent placement 2001  . Cataract extraction    .  Colonoscopy  02-20-08    per Dr. Olevia Perches, diverticulosis, no polyps, repeat in 10 yrs    Family History  Problem Relation Age of Onset  . Emphysema Mother   . Heart attack Father   . Diabetes Father   . Breast cancer Maternal Grandmother   . Diabetes Paternal Grandfather   . Hypertension      family history   History  Substance Use Topics  . Smoking status: Former Research scientist (life sciences)  . Smokeless tobacco: Never Used  . Alcohol Use: No   OB History    No data available     Review of Systems    Allergies  Anbesol cold sore therapy; Morphine; Other; and Codeine  Home Medications   Prior to Admission medications   Medication Sig Start Date End Date Taking? Authorizing Provider  amitriptyline (ELAVIL) 50 MG tablet TAKE ONE TABLET BY MOUTH NIGHTLY AT BEDTIME  02/19/14   Laurey Morale, MD  Ascorbic Acid (VITAMIN C) 500 MG tablet Take 500 mg by mouth daily.      Historical Provider, MD  aspirin 81 MG tablet Take 81 mg by mouth at bedtime.     Historical Provider, MD  atenolol (TENORMIN) 50 MG tablet TAKE ONE TABLET BY MOUTH ONE TIME DAILY     Josue Hector, MD  Bee Pollen 500 MG TABS  Take 500 mg by mouth daily.     Historical Provider, MD  Calcium Carbonate (CALCIUM 600 PO) Take 600 mg by mouth daily.     Historical Provider, MD  Cholecalciferol (VITAMIN D) 1000 UNITS capsule Take 1,000 Units by mouth daily.      Historical Provider, MD  clopidogrel (PLAVIX) 75 MG tablet Take 75 mg by mouth daily with breakfast.    Historical Provider, MD  furosemide (LASIX) 20 MG tablet Take 20 mg by mouth daily.    Historical Provider, MD  furosemide (LASIX) 20 MG tablet TAKE ONE TABLET BY MOUTH ONE TIME DAILY  03/31/14   Laurey Morale, MD  gabapentin (NEURONTIN) 100 MG capsule Take 100 mg by mouth 2 (two) times daily.    Historical Provider, MD  gabapentin (NEURONTIN) 100 MG capsule TAKE ONE CAPSULE BY MOUTH THREE TIMES DAILY  03/31/14   Laurey Morale, MD  isosorbide dinitrate (ISORDIL) 30 MG tablet Take 30 mg  by mouth 2 (two) times daily.    Historical Provider, MD  metFORMIN (GLUCOPHAGE) 1000 MG tablet Take 1,000 mg by mouth daily with breakfast.    Historical Provider, MD  Multiple Vitamin (MULTIVITAMIN) capsule Take 1 capsule by mouth daily.      Historical Provider, MD  nitroGLYCERIN (NITROSTAT) 0.4 MG SL tablet Place 0.4 mg under the tongue every 5 (five) minutes as needed for chest pain.     Historical Provider, MD  oxycodone (OXY-IR) 5 MG capsule Take 5 mg by mouth every 4 (four) hours as needed (severe pain).     Historical Provider, MD  pravastatin (PRAVACHOL) 40 MG tablet Take 40 mg by mouth every evening.    Historical Provider, MD  promethazine (PHENERGAN) 25 MG tablet Take 25 mg by mouth every 6 (six) hours as needed for nausea or vomiting.     Historical Provider, MD  ramipril (ALTACE) 10 MG capsule TAKE ONE CAPSULE BY MOUTH ONE TIME DAILY  03/31/14   Laurey Morale, MD  ranolazine (RANEXA) 500 MG 12 hr tablet Take 500 mg by mouth 2 (two) times daily.    Historical Provider, MD  valACYclovir (VALTREX) 500 MG tablet Take 1 tablet (500 mg total) by mouth 2 (two) times daily. 02/24/14   Laurey Morale, MD   BP 129/63 mmHg  Pulse 68  Temp(Src) 97.8 F (36.6 C) (Oral)  Resp 20  SpO2 98% Physical Exam  Constitutional: She is oriented to person, place, and time. She appears well-developed and well-nourished. No distress.  HENT:  Nose: Nose normal.  Mouth/Throat: Oropharynx is clear and moist. No oropharyngeal exudate.  Obvious large right periorbital hematoma. There is tenderness to palpation around the orbit.  Eyes: Conjunctivae and EOM are normal. Pupils are equal, round, and reactive to light. No scleral icterus.  Accessory muscles are intact on the left. Patient has limited downward gaze on the right, and this causes pain.  Neck: Normal range of motion. Neck supple. No JVD present. No tracheal deviation present. No thyromegaly present.  Cardiovascular: Normal rate, regular rhythm and normal  heart sounds.  Exam reveals no gallop and no friction rub.   No murmur heard. Pulmonary/Chest: Effort normal and breath sounds normal. No respiratory distress. She has no wheezes. She exhibits no tenderness.  Abdominal: Soft. Bowel sounds are normal. She exhibits no distension and no mass. There is no tenderness. There is no rebound and no guarding.  Musculoskeletal: Normal range of motion. She exhibits tenderness. She exhibits no edema.  There is  a large hematoma at the right elbow.. There is tenderness to palpation. There is no crepitus or bony step-offs. There is also a small superficial abrasion to the right knee. No crepitus no bony deformities.  Lymphadenopathy:    She has no cervical adenopathy.  Neurological: She is alert and oriented to person, place, and time. A cranial nerve deficit is present. She exhibits normal muscle tone.  5 out of 5 strength 4 extremities. Normal sensation 4 extremities. Cerebellar testing is normal, gait was not assessed.  Skin: Skin is warm and dry. No rash noted. She is not diaphoretic. No erythema. No pallor.  Nursing note and vitals reviewed.   ED Course  Procedures (including critical care time) Labs Review Labs Reviewed  CBC WITH DIFFERENTIAL - Abnormal; Notable for the following:    WBC 18.5 (*)    Hemoglobin 11.8 (*)    HCT 34.2 (*)    Neutrophils Relative % 91 (*)    Neutro Abs 16.8 (*)    Lymphocytes Relative 6 (*)    All other components within normal limits  COMPREHENSIVE METABOLIC PANEL - Abnormal; Notable for the following:    Sodium 125 (*)    Chloride 87 (*)    Glucose, Bld 242 (*)    GFR calc non Af Amer 50 (*)    GFR calc Af Amer 58 (*)    Anion gap 18 (*)    All other components within normal limits  URINALYSIS, ROUTINE W REFLEX MICROSCOPIC - Abnormal; Notable for the following:    Ketones, ur 15 (*)    Leukocytes, UA TRACE (*)    All other components within normal limits  URINE MICROSCOPIC-ADD ON - Abnormal; Notable for  the following:    Casts HYALINE CASTS (*)    All other components within normal limits  CBG MONITORING, ED - Abnormal; Notable for the following:    Glucose-Capillary 173 (*)    All other components within normal limits  URINE CULTURE  LIPASE, BLOOD  PROTIME-INR  HEMOGLOBIN A1C  TSH  TROPONIN I  COMPREHENSIVE METABOLIC PANEL  CBC  PROTIME-INR  I-STAT TROPOININ, ED  I-STAT CG4 LACTIC ACID, ED    Imaging Review Dg Chest 2 View  06/05/2014   CLINICAL DATA:  Chest pain beginning today which shortness of breath.  EXAM: CHEST  2 VIEW  COMPARISON:  11/01/2009  FINDINGS: Sternotomy wires unchanged. Lungs are adequately inflated without focal consolidation or effusion. Cardiomediastinal silhouette is within normal. Coronary artery stent unchanged. Remainder of the exam is unchanged.  IMPRESSION: No active cardiopulmonary disease.   Electronically Signed   By: Marin Olp M.D.   On: 06/05/2014 01:03   Ct Head Wo Contrast  06/05/2014   CLINICAL DATA:  Trip and fall on patio this evening. RIGHT periorbital soft tissue swelling and laceration. On Plavix.  EXAM: CT HEAD WITHOUT CONTRAST  CT ORBITS WITHOUT CONTRAST  CT CERVICAL SPINE WITHOUT CONTRAST  TECHNIQUE: Multidetector CT imaging of the head, cervical spine, and orbital structures were performed using the standard protocol without intravenous contrast. Multiplanar CT image reconstructions of the cervical spine and maxillofacial structures were also generated.  COMPARISON:  CT of the head February 21, 2006  FINDINGS: CT HEAD FINDINGS  The ventricles and sulci are normal for age. No intraparenchymal hemorrhage, mass effect nor midline shift. Patchy supratentorial white matter hypodensities are less than expected for patient's age and though non-specific suggest sequelae of chronic small vessel ischemic disease. No acute large vascular territory infarcts.  3  mm RIGHT frontal subdural hematoma, axial 16/29. Small amount of LEFT frontal subarachnoid  blood. Basal cisterns are patent. Moderate calcific atherosclerosis of the carotid siphons. No skull fracture.  CT ORBITS FINDINGS  Orbital walls and rims intact. No acute visualized facial fracture. No destructive bony lesions.  Paranasal sinuses are well aerated. Nasal septum slightly deviates to the RIGHT with bilateral concha bullosa.  Large RIGHT periorbital soft tissue swelling without subcutaneous gas or radiopaque foreign bodies. Ocular globes intact. Status post bilateral ocular lens implants. Preservation the retrobulbar and extra Conal fat. Normal appearance optic nerve sheath complexes. Normal appearance of the extraocular muscles.  CT CERVICAL SPINE FINDINGS  Cervical vertebral bodies and posterior elements are intact. Minimal grade 1 C3-4 retrolisthesis on degenerative basis. Severe C3-4 through C7-T1 degenerative discs. C1-2 articulation maintained with severe arthropathy. Minimal calcification about the odontoid process may be seen with CPPD. No destructive bony lesions. Moderate calcific atherosclerosis of the carotid arteries.  IMPRESSION: CT HEAD: 3 mm RIGHT frontal subdural hematoma without mass effect. Small amount of LEFT frontal subarachnoid blood.  Involutional changes. Mild white matter changes suggest chronic small vessel ischemic disease.  CT ORBITS: RIGHT periorbital soft tissue swelling without postseptal hematoma. No a acute orbital fracture.  CT CERVICAL SPINE: No acute fracture. Minimal grade 1 C3-4 retrolisthesis on degenerative basis.  Acute findings discussed with and reconfirmed by Dr.Evalyne Cortopassi on 06/05/2014 at 1:00 am.   Electronically Signed   By: Elon Alas   On: 06/05/2014 01:05   Ct Cervical Spine Wo Contrast  06/05/2014   CLINICAL DATA:  Trip and fall on patio this evening. RIGHT periorbital soft tissue swelling and laceration. On Plavix.  EXAM: CT HEAD WITHOUT CONTRAST  CT ORBITS WITHOUT CONTRAST  CT CERVICAL SPINE WITHOUT CONTRAST  TECHNIQUE: Multidetector CT  imaging of the head, cervical spine, and orbital structures were performed using the standard protocol without intravenous contrast. Multiplanar CT image reconstructions of the cervical spine and maxillofacial structures were also generated.  COMPARISON:  CT of the head February 21, 2006  FINDINGS: CT HEAD FINDINGS  The ventricles and sulci are normal for age. No intraparenchymal hemorrhage, mass effect nor midline shift. Patchy supratentorial white matter hypodensities are less than expected for patient's age and though non-specific suggest sequelae of chronic small vessel ischemic disease. No acute large vascular territory infarcts.  3 mm RIGHT frontal subdural hematoma, axial 16/29. Small amount of LEFT frontal subarachnoid blood. Basal cisterns are patent. Moderate calcific atherosclerosis of the carotid siphons. No skull fracture.  CT ORBITS FINDINGS  Orbital walls and rims intact. No acute visualized facial fracture. No destructive bony lesions.  Paranasal sinuses are well aerated. Nasal septum slightly deviates to the RIGHT with bilateral concha bullosa.  Large RIGHT periorbital soft tissue swelling without subcutaneous gas or radiopaque foreign bodies. Ocular globes intact. Status post bilateral ocular lens implants. Preservation the retrobulbar and extra Conal fat. Normal appearance optic nerve sheath complexes. Normal appearance of the extraocular muscles.  CT CERVICAL SPINE FINDINGS  Cervical vertebral bodies and posterior elements are intact. Minimal grade 1 C3-4 retrolisthesis on degenerative basis. Severe C3-4 through C7-T1 degenerative discs. C1-2 articulation maintained with severe arthropathy. Minimal calcification about the odontoid process may be seen with CPPD. No destructive bony lesions. Moderate calcific atherosclerosis of the carotid arteries.  IMPRESSION: CT HEAD: 3 mm RIGHT frontal subdural hematoma without mass effect. Small amount of LEFT frontal subarachnoid blood.  Involutional changes.  Mild white matter changes suggest chronic small vessel ischemic disease.  CT ORBITS: RIGHT periorbital soft tissue swelling without postseptal hematoma. No a acute orbital fracture.  CT CERVICAL SPINE: No acute fracture. Minimal grade 1 C3-4 retrolisthesis on degenerative basis.  Acute findings discussed with and reconfirmed by Dr.Latesa Fratto on 06/05/2014 at 1:00 am.   Electronically Signed   By: Elon Alas   On: 06/05/2014 01:05   Ct Orbitss W/o Cm  06/05/2014   CLINICAL DATA:  Trip and fall on patio this evening. RIGHT periorbital soft tissue swelling and laceration. On Plavix.  EXAM: CT HEAD WITHOUT CONTRAST  CT ORBITS WITHOUT CONTRAST  CT CERVICAL SPINE WITHOUT CONTRAST  TECHNIQUE: Multidetector CT imaging of the head, cervical spine, and orbital structures were performed using the standard protocol without intravenous contrast. Multiplanar CT image reconstructions of the cervical spine and maxillofacial structures were also generated.  COMPARISON:  CT of the head February 21, 2006  FINDINGS: CT HEAD FINDINGS  The ventricles and sulci are normal for age. No intraparenchymal hemorrhage, mass effect nor midline shift. Patchy supratentorial white matter hypodensities are less than expected for patient's age and though non-specific suggest sequelae of chronic small vessel ischemic disease. No acute large vascular territory infarcts.  3 mm RIGHT frontal subdural hematoma, axial 16/29. Small amount of LEFT frontal subarachnoid blood. Basal cisterns are patent. Moderate calcific atherosclerosis of the carotid siphons. No skull fracture.  CT ORBITS FINDINGS  Orbital walls and rims intact. No acute visualized facial fracture. No destructive bony lesions.  Paranasal sinuses are well aerated. Nasal septum slightly deviates to the RIGHT with bilateral concha bullosa.  Large RIGHT periorbital soft tissue swelling without subcutaneous gas or radiopaque foreign bodies. Ocular globes intact. Status post bilateral  ocular lens implants. Preservation the retrobulbar and extra Conal fat. Normal appearance optic nerve sheath complexes. Normal appearance of the extraocular muscles.  CT CERVICAL SPINE FINDINGS  Cervical vertebral bodies and posterior elements are intact. Minimal grade 1 C3-4 retrolisthesis on degenerative basis. Severe C3-4 through C7-T1 degenerative discs. C1-2 articulation maintained with severe arthropathy. Minimal calcification about the odontoid process may be seen with CPPD. No destructive bony lesions. Moderate calcific atherosclerosis of the carotid arteries.  IMPRESSION: CT HEAD: 3 mm RIGHT frontal subdural hematoma without mass effect. Small amount of LEFT frontal subarachnoid blood.  Involutional changes. Mild white matter changes suggest chronic small vessel ischemic disease.  CT ORBITS: RIGHT periorbital soft tissue swelling without postseptal hematoma. No a acute orbital fracture.  CT CERVICAL SPINE: No acute fracture. Minimal grade 1 C3-4 retrolisthesis on degenerative basis.  Acute findings discussed with and reconfirmed by Dr.Jestina Stephani on 06/05/2014 at 1:00 am.   Electronically Signed   By: Elon Alas   On: 06/05/2014 01:05     EKG Interpretation   Date/Time:  Friday June 05 2014 00:00:56 EST Ventricular Rate:  64 PR Interval:  206 QRS Duration: 93 QT Interval:  445 QTC Calculation: 459 R Axis:   -19 Text Interpretation:  Sinus rhythm Borderline left axis deviation No  significant change since last tracing Confirmed by Glynn Octave  [phone]) on 06/05/2014 1:08:50 AM      MDM   Final diagnoses:  Fall    She presents emergency department after a fall. Will evaluate with, workup including CT scans and x-rays. Patient will also need a medical evaluation due to her persistent nausea vomiting chest pain and lightheadedness over the past week.  CT scan does not reveal any acute fracture. CT of her head does reveal a right sided subdural hematoma,  3 mm no  mass effect. Also a left frontal subarachnoid hemorrhage. I discussed the case with the on-call neurosurgeon who does not recommend transfusion of platelets at this time. He will evaluate the patient in the morning and will require repeat CT scan as the patient is on aspirin and Plavix. Regarding the cause for her fall, she has been having what appears to be ischemic chest pain which will be evaluated in the hospital. Also her urine infection may not be properly treated. She was given ceftriaxone empirically. 18 this may be due to infection, it could also just be an acute phase reactant from the trauma. Patient was admitted to [facility], telemetry unit.  CRITICAL CARE Performed by: Everlene Balls   Total critical care time: 45 minutes  Critical care time was exclusive of separately billable procedures and treating other patients.  Critical care was necessary to treat or prevent imminent or life-threatening deterioration.  Critical care was time spent personally by me on the following activities: development of treatment plan with patient and/or surrogate as well as nursing, discussions with consultants, evaluation of patient's response to treatment, examination of patient, obtaining history from patient or surrogate, ordering and performing treatments and interventions, ordering and review of laboratory studies, ordering and review of radiographic studies, pulse oximetry and re-evaluation of patient's condition.   LACERATION REPAIR Performed by: Everlene Balls Authorized byEverlene Balls Consent: Verbal consent obtained. Risks and benefits: risks, benefits and alternatives were discussed Consent given by: patient Patient identity confirmed: provided demographic data Prepped and Draped in normal sterile fashion Wound explored  Laceration Location: right super orbital area  Laceration Length: 3cm  No Foreign Bodies seen or palpated  Anesthesia: local infiltration  Local anesthetic:  lidocaine 1% with epinephrine  Anesthetic total: 2 ml  Irrigation method: syringe Amount of cleaning: standard  Skin closure: sutures  Number of sutures: 3  Technique: simple interrupted  Patient tolerance: Patient tolerated the procedure well with no immediate complications.     Everlene Balls, MD 06/05/14 [id number]

## 2014-06-05 NOTE — ED Notes (Signed)
Dr Oletta Lamas into see pt and speak to family

## 2014-06-05 NOTE — Progress Notes (Signed)
Patient arrived to floor from Ed with daughter.  Patient alert and oriented x4 and was oriented to [facility] policies and procedures.  Her plan of care was discussed and patient settled in room with vital signs taken.  Will continue to monitor.  Kizzie Bane, RN

## 2014-06-05 NOTE — H&P (Signed)
[facility] History and Physical  Patient: Carla Friedman  JIR:[id number]  DOB: 25-May-1934  DOS: the patient was seen and examined on 06/05/2014 PCP: Laurey Morale, MD  Chief Complaint: fall  HPI: Carla Friedman is a 80 y.o. female with Past medical history of coronary artery disease 1 hospitalized for this, but if there was no disease, hypertension, dyslipidemia, diabetes mellitus, peptic ulcer, hypothyroidism, memory issue. The patient is presenting with a fall. The patient lives in the group home and while she was walking there she missed a step and fell down with face first on the right side. She did not passed out did not had any dizziness or lightheadedness no vertigo reported. At the time of my evaluation she did not have any chest pain shortness of breath nausea vomiting abdominal pain focal deficit. Since last one week the daughter has noted the patient has been having progressively worsening confusion and generalized weakness. Since last 2 weeks patient has poor appetite and has not been eating and drinking good now. She started having profuse vomiting after the episode of the fall tonight. She was started on Bactrim since last Friday for UTI which has been making her nauseousness. She has on and off chest pain since long and is been taking nitroglycerin and ranexa for the same. She mentions that earlier this morning she had very severe chest pain and has taken 2 nitroglycerin back-to-back.  The patient is coming from home. And at her baseline dependent for most of her ADL.  Review of Systems: as mentioned in the history of present illness.  A Comprehensive review of the other systems is negative.  Past Medical History  Diagnosis Date  . Coronary artery disease     sees Dr. Johnsie Cancel, in Hospice for this   . Chest pain, atypical   . PVD (peripheral vascular disease)   . Hypertension   . Dyslipidemia   . Diabetes mellitus type II   . Peptic ulcer disease   .  Depression   . Diabetic neuropathy   . Hypothyroidism   . Back pain     low  . Cancer, skin, squamous cell     sees Dr. Lois Huxley at [facility] dermatology  . Confusion    Past Surgical History  Procedure Laterality Date  . Appendectomy    . Tonsillectomy    . Coronary artery bypass graft      stent placement 2001  . Cataract extraction    . Colonoscopy  02-20-08    per Dr. Olevia Perches, diverticulosis, no polyps, repeat in 10 yrs    Social History:  reports that she has quit smoking. She has never used smokeless tobacco. She reports that she does not drink alcohol or use illicit drugs.  Allergies  Allergen Reactions  . Anbesol Cold Sore Therapy [Lip Medex]     Caused lips to swell  . Morphine Other (See Comments)    Respiratory distress  . Other Other (See Comments)    Vicryl Stitches  "body rejects them"  . Codeine Other (See Comments)    Respiratory distress    Family History  Problem Relation Age of Onset  . Emphysema Mother   . Heart attack Father   . Diabetes Father   . Breast cancer Maternal Grandmother   . Diabetes Paternal Grandfather   . Hypertension      family history    Prior to Admission medications   Medication Sig Start Date End Date Taking? Authorizing Provider  amitriptyline (ELAVIL)  50 MG tablet Take 50 mg by mouth at bedtime.   Yes Historical Provider, MD  Ascorbic Acid (VITAMIN C) 500 MG tablet Take 500 mg by mouth daily.     Yes Historical Provider, MD  aspirin 81 MG tablet Take 81 mg by mouth at bedtime.    Yes Historical Provider, MD  atenolol (TENORMIN) 50 MG tablet Take 50 mg by mouth every morning.   Yes Historical Provider, MD  Bee Pollen 500 MG TABS Take 500 mg by mouth daily.    Yes Historical Provider, MD  Calcium Carbonate (CALCIUM 600 PO) Take 600 mg by mouth daily.    Yes Historical Provider, MD  Cholecalciferol (VITAMIN D) 1000 UNITS capsule Take 1,000 Units by mouth daily.     Yes Historical Provider, MD  clopidogrel (PLAVIX) 75  MG tablet Take 75 mg by mouth daily with breakfast.   Yes Historical Provider, MD  furosemide (LASIX) 20 MG tablet Take 20 mg by mouth daily.   Yes Historical Provider, MD  gabapentin (NEURONTIN) 100 MG capsule Take 100 mg by mouth 2 (two) times daily.   Yes Historical Provider, MD  isosorbide dinitrate (ISORDIL) 30 MG tablet Take 30 mg by mouth 2 (two) times daily.   Yes Historical Provider, MD  metFORMIN (GLUCOPHAGE) 1000 MG tablet Take 1,000 mg by mouth daily with breakfast.   Yes Historical Provider, MD  Multiple Vitamin (MULTIVITAMIN) capsule Take 1 capsule by mouth daily.     Yes Historical Provider, MD  nitroGLYCERIN (NITROSTAT) 0.4 MG SL tablet Place 0.4 mg under the tongue every 5 (five) minutes as needed for chest pain.    Yes Historical Provider, MD  oxycodone (OXY-IR) 5 MG capsule Take 5 mg by mouth every 4 (four) hours as needed (severe pain).    Yes Historical Provider, MD  pravastatin (PRAVACHOL) 40 MG tablet Take 40 mg by mouth every evening.   Yes Historical Provider, MD  promethazine (PHENERGAN) 25 MG tablet Take 25 mg by mouth every 6 (six) hours as needed for nausea or vomiting.    Yes Historical Provider, MD  ramipril (ALTACE) 10 MG capsule Take 10 mg by mouth daily.   Yes Historical Provider, MD  ranolazine (RANEXA) 500 MG 12 hr tablet Take 500 mg by mouth 2 (two) times daily.   Yes Historical Provider, MD  sulfamethoxazole-trimethoprim (BACTRIM DS,SEPTRA DS) 800-160 MG per tablet Take 1 tablet by mouth 2 (two) times daily.   Yes Historical Provider, MD  valACYclovir (VALTREX) 500 MG tablet Take 1 tablet (500 mg total) by mouth 2 (two) times daily. Patient taking differently: Take 500 mg by mouth 2 (two) times daily as needed (fever blisters).  02/24/14  Yes Laurey Morale, MD  amitriptyline (ELAVIL) 50 MG tablet TAKE ONE TABLET BY MOUTH NIGHTLY AT BEDTIME  02/19/14   Laurey Morale, MD  atenolol (TENORMIN) 50 MG tablet TAKE ONE TABLET BY MOUTH ONE TIME DAILY     Josue Hector, MD   furosemide (LASIX) 20 MG tablet TAKE ONE TABLET BY MOUTH ONE TIME DAILY  03/31/14   Laurey Morale, MD  gabapentin (NEURONTIN) 100 MG capsule TAKE ONE CAPSULE BY MOUTH THREE TIMES DAILY  03/31/14   Laurey Morale, MD  ramipril (ALTACE) 10 MG capsule TAKE ONE CAPSULE BY MOUTH ONE TIME DAILY  03/31/14   Laurey Morale, MD    Physical Exam: Filed Vitals:   06/05/14 0157 06/05/14 0245 06/05/14 0452 06/05/14 0526  BP: 121/53 127/44 123/78  Pulse: 63 62 64   Temp:   98 F (36.7 C) 97.5 F (36.4 C)  TempSrc:   Oral   Resp: 16 18 16    SpO2: 97% 100% 96%     General: Alert, Awake and Oriented to Time, Place and Person. Appear in mild distress Eyes: right eyelid laceration, hematoma Eye movement appreciated on the eyelid, not painful. ENT: Oral Mucosa clear moist. Neck: no JVD Cardiovascular: S1 and S2 Present, aortic systolic Murmur, Peripheral Pulses Present Respiratory: Bilateral Air entry equal and Decreased, Clear to Auscultation, noCrackles, no wheezes Abdomen: Bowel Sound present, Soft and non tender Skin: no Rash Extremities: no Pedal edema, no calf tenderness Neurologic: Grossly no focal neuro deficit.  Labs on Admission:  CBC:  Recent Labs Lab 06/05/14 0021 06/05/14 0457  WBC 18.5* 14.8*  NEUTROABS 16.8*  --   HGB 11.8* 10.4*  HCT 34.2* 29.2*  MCV 85.5 83.0  PLT 276 278    CMP     Component Value Date/Time   NA 128* 06/05/2014 0457   K 4.7 06/05/2014 0457   CL 91* 06/05/2014 0457   CO2 25 06/05/2014 0457   GLUCOSE 168* 06/05/2014 0457   BUN 13 06/05/2014 0457   CREATININE 0.97 06/05/2014 0457   CALCIUM 8.8 06/05/2014 0457   PROT 5.7* 06/05/2014 0457   ALBUMIN 3.2* 06/05/2014 0457   AST 13 06/05/2014 0457   ALT 10 06/05/2014 0457   ALKPHOS 49 06/05/2014 0457   BILITOT 0.4 06/05/2014 0457   GFRNONAA 55* 06/05/2014 0457   GFRAA 64* 06/05/2014 0457     Recent Labs Lab 06/05/14 0021  LIPASE 14   No results for input(s): AMMONIA in the last 168  hours.   Recent Labs Lab 06/05/14 0457  TROPONINI <0.30   BNP (last 3 results) No results for input(s): PROBNP in the last 8760 hours.  Radiological Exams on Admission: Dg Chest 2 View  06/05/2014   CLINICAL DATA:  Chest pain beginning today which shortness of breath.  EXAM: CHEST  2 VIEW  COMPARISON:  11/01/2009  FINDINGS: Sternotomy wires unchanged. Lungs are adequately inflated without focal consolidation or effusion. Cardiomediastinal silhouette is within normal. Coronary artery stent unchanged. Remainder of the exam is unchanged.  IMPRESSION: No active cardiopulmonary disease.   Electronically Signed   By: Marin Olp M.D.   On: 06/05/2014 01:03   Ct Head Wo Contrast  06/05/2014   CLINICAL DATA:  Trip and fall on patio this evening. RIGHT periorbital soft tissue swelling and laceration. On Plavix.  EXAM: CT HEAD WITHOUT CONTRAST  CT ORBITS WITHOUT CONTRAST  CT CERVICAL SPINE WITHOUT CONTRAST  TECHNIQUE: Multidetector CT imaging of the head, cervical spine, and orbital structures were performed using the standard protocol without intravenous contrast. Multiplanar CT image reconstructions of the cervical spine and maxillofacial structures were also generated.  COMPARISON:  CT of the head February 21, 2006  FINDINGS: CT HEAD FINDINGS  The ventricles and sulci are normal for age. No intraparenchymal hemorrhage, mass effect nor midline shift. Patchy supratentorial white matter hypodensities are less than expected for patient's age and though non-specific suggest sequelae of chronic small vessel ischemic disease. No acute large vascular territory infarcts.  3 mm RIGHT frontal subdural hematoma, axial 16/29. Small amount of LEFT frontal subarachnoid blood. Basal cisterns are patent. Moderate calcific atherosclerosis of the carotid siphons. No skull fracture.  CT ORBITS FINDINGS  Orbital walls and rims intact. No acute visualized facial fracture. No destructive bony lesions.  Paranasal sinuses are  well  aerated. Nasal septum slightly deviates to the RIGHT with bilateral concha bullosa.  Large RIGHT periorbital soft tissue swelling without subcutaneous gas or radiopaque foreign bodies. Ocular globes intact. Status post bilateral ocular lens implants. Preservation the retrobulbar and extra Conal fat. Normal appearance optic nerve sheath complexes. Normal appearance of the extraocular muscles.  CT CERVICAL SPINE FINDINGS  Cervical vertebral bodies and posterior elements are intact. Minimal grade 1 C3-4 retrolisthesis on degenerative basis. Severe C3-4 through C7-T1 degenerative discs. C1-2 articulation maintained with severe arthropathy. Minimal calcification about the odontoid process may be seen with CPPD. No destructive bony lesions. Moderate calcific atherosclerosis of the carotid arteries.  IMPRESSION: CT HEAD: 3 mm RIGHT frontal subdural hematoma without mass effect. Small amount of LEFT frontal subarachnoid blood.  Involutional changes. Mild white matter changes suggest chronic small vessel ischemic disease.  CT ORBITS: RIGHT periorbital soft tissue swelling without postseptal hematoma. No a acute orbital fracture.  CT CERVICAL SPINE: No acute fracture. Minimal grade 1 C3-4 retrolisthesis on degenerative basis.  Acute findings discussed with and reconfirmed by Dr.ADELEKE ONI on 06/05/2014 at 1:00 am.   Electronically Signed   By: [place] Alas   On: 06/05/2014 01:05   Ct Cervical Spine Wo Contrast  06/05/2014   CLINICAL DATA:  Trip and fall on patio this evening. RIGHT periorbital soft tissue swelling and laceration. On Plavix.  EXAM: CT HEAD WITHOUT CONTRAST  CT ORBITS WITHOUT CONTRAST  CT CERVICAL SPINE WITHOUT CONTRAST  TECHNIQUE: Multidetector CT imaging of the head, cervical spine, and orbital structures were performed using the standard protocol without intravenous contrast. Multiplanar CT image reconstructions of the cervical spine and maxillofacial structures were also generated.  COMPARISON:   CT of the head February 21, 2006  FINDINGS: CT HEAD FINDINGS  The ventricles and sulci are normal for age. No intraparenchymal hemorrhage, mass effect nor midline shift. Patchy supratentorial white matter hypodensities are less than expected for patient's age and though non-specific suggest sequelae of chronic small vessel ischemic disease. No acute large vascular territory infarcts.  3 mm RIGHT frontal subdural hematoma, axial 16/29. Small amount of LEFT frontal subarachnoid blood. Basal cisterns are patent. Moderate calcific atherosclerosis of the carotid siphons. No skull fracture.  CT ORBITS FINDINGS  Orbital walls and rims intact. No acute visualized facial fracture. No destructive bony lesions.  Paranasal sinuses are well aerated. Nasal septum slightly deviates to the RIGHT with bilateral concha bullosa.  Large RIGHT periorbital soft tissue swelling without subcutaneous gas or radiopaque foreign bodies. Ocular globes intact. Status post bilateral ocular lens implants. Preservation the retrobulbar and extra Conal fat. Normal appearance optic nerve sheath complexes. Normal appearance of the extraocular muscles.  CT CERVICAL SPINE FINDINGS  Cervical vertebral bodies and posterior elements are intact. Minimal grade 1 C3-4 retrolisthesis on degenerative basis. Severe C3-4 through C7-T1 degenerative discs. C1-2 articulation maintained with severe arthropathy. Minimal calcification about the odontoid process may be seen with CPPD. No destructive bony lesions. Moderate calcific atherosclerosis of the carotid arteries.  IMPRESSION: CT HEAD: 3 mm RIGHT frontal subdural hematoma without mass effect. Small amount of LEFT frontal subarachnoid blood.  Involutional changes. Mild white matter changes suggest chronic small vessel ischemic disease.  CT ORBITS: RIGHT periorbital soft tissue swelling without postseptal hematoma. No a acute orbital fracture.  CT CERVICAL SPINE: No acute fracture. Minimal grade 1 C3-4 retrolisthesis  on degenerative basis.  Acute findings discussed with and reconfirmed by Dr.ADELEKE ONI on 06/05/2014 at 1:00 am.   Electronically Signed  By: [place]on Alas   On: 06/05/2014 01:05   Ct Orbitss W/o Cm  06/05/2014   CLINICAL DATA:  Trip and fall on patio this evening. RIGHT periorbital soft tissue swelling and laceration. On Plavix.  EXAM: CT HEAD WITHOUT CONTRAST  CT ORBITS WITHOUT CONTRAST  CT CERVICAL SPINE WITHOUT CONTRAST  TECHNIQUE: Multidetector CT imaging of the head, cervical spine, and orbital structures were performed using the standard protocol without intravenous contrast. Multiplanar CT image reconstructions of the cervical spine and maxillofacial structures were also generated.  COMPARISON:  CT of the head February 21, 2006  FINDINGS: CT HEAD FINDINGS  The ventricles and sulci are normal for age. No intraparenchymal hemorrhage, mass effect nor midline shift. Patchy supratentorial white matter hypodensities are less than expected for patient's age and though non-specific suggest sequelae of chronic small vessel ischemic disease. No acute large vascular territory infarcts.  3 mm RIGHT frontal subdural hematoma, axial 16/29. Small amount of LEFT frontal subarachnoid blood. Basal cisterns are patent. Moderate calcific atherosclerosis of the carotid siphons. No skull fracture.  CT ORBITS FINDINGS  Orbital walls and rims intact. No acute visualized facial fracture. No destructive bony lesions.  Paranasal sinuses are well aerated. Nasal septum slightly deviates to the RIGHT with bilateral concha bullosa.  Large RIGHT periorbital soft tissue swelling without subcutaneous gas or radiopaque foreign bodies. Ocular globes intact. Status post bilateral ocular lens implants. Preservation the retrobulbar and extra Conal fat. Normal appearance optic nerve sheath complexes. Normal appearance of the extraocular muscles.  CT CERVICAL SPINE FINDINGS  Cervical vertebral bodies and posterior elements are intact.  Minimal grade 1 C3-4 retrolisthesis on degenerative basis. Severe C3-4 through C7-T1 degenerative discs. C1-2 articulation maintained with severe arthropathy. Minimal calcification about the odontoid process may be seen with CPPD. No destructive bony lesions. Moderate calcific atherosclerosis of the carotid arteries.  IMPRESSION: CT HEAD: 3 mm RIGHT frontal subdural hematoma without mass effect. Small amount of LEFT frontal subarachnoid blood.  Involutional changes. Mild white matter changes suggest chronic small vessel ischemic disease.  CT ORBITS: RIGHT periorbital soft tissue swelling without postseptal hematoma. No a acute orbital fracture.  CT CERVICAL SPINE: No acute fracture. Minimal grade 1 C3-4 retrolisthesis on degenerative basis.  Acute findings discussed with and reconfirmed by Dr.ADELEKE ONI on 06/05/2014 at 1:00 am.   Electronically Signed   By: Elon Alas   On: 06/05/2014 01:05   EKG: Independently reviewed. normal sinus rhythm, nonspecific ST and T waves changes.  Assessment/Plan Principal Problem:   SDH (subdural hematoma) Active Problems:   Essential hypertension   Coronary atherosclerosis   Peripheral vascular disease   Memory loss   SAH (subarachnoid hemorrhage)   Diabetes mellitus type 2 in nonobese   1. SDH (subdural hematoma) The patient is presenting with a mechanical fall. Patient is taking Plavix due to her history of coronary artery disease and peripheral vascular disease. After the fall she had profuse vomiting.  CT of the head shows 3 mm right frontal subdural hematoma without any mass effect as well as a small left frontal subarachnoid hemorrhage. Neuro-surgery has been consulted who will be following up with the patient. Currently the patient will be admitted in the telemetry unit with serial monitoring of neuro checks as well as telemetry. PTOT consultation will also be requested. Close monitoring for secondary infection of the eyelid laceration, May need  oral antibiotic for the same.  2. UTI Patient was on Bactrim at home. At present I would continue her on ceftriaxone.  3.hyponatremia Patient has not been eating and drinking well and has been acting confused since last one week which is progressively worsening. Probable etiology of her hyponatremia is poor oral intake also possibility of amitriptyline causing hyponatremia cannot be ruled out. At present I would give her gentle IV hydration and monitor her BMP every 2 hours.  4.coronary artery disease Peripheral vascular disease Patient is on aspirin and Plavix for the same. At present we will hold both in view of SDH, and discussed with neurosurgery for resumption.  Advance goals of care discussion: DNR/DNI on hospice   Consults: neurosurgery  DVT Prophylaxis: mechanical compression device Nutrition: nothing by mouth  Family Communication: daughter was present at bedside, opportunity was given to ask question and all questions were answered satisfactorily at the time of interview. Disposition: Admitted to inpatient in telemetry unit.  Author: Berle Mull, MD [facility] Pager: [phone] 06/05/2014,   If 7PM-7AM, please contact night-coverage [email or web address] Password [email or web address]

## 2014-06-05 NOTE — ED Notes (Signed)
Patient c/o feeling "sick"  Has vomited small amount of greenish bile.  Daughter went to get toast and banana and patient had only 1-2 bites

## 2014-06-05 NOTE — Progress Notes (Signed)
Patient has not urinated since 0300.  Bladder scan revealed 434 cc urine.  In and out catheterization performed using sterile technique with assistance from the charge RN.  400 cc dark amber urine removed from bladder.  Post cath bladder scan revealed less than 100 cc.  Will continue to monitor.  Kizzie Bane, RN

## 2014-06-05 NOTE — Evaluation (Signed)
Physical Therapy Evaluation Patient Details Name: Carla Friedman MRN: [id number] DOB: 11/19/1933 Today's Date: 06/05/2014   History of Present Illness  Patient is a 80 y/o with CHF admitted after a fall at her independent living facitilty with right facial laceration and contusion and elbow contusion.  She also had UTI and was vomiting after taking antibiotics/  Clinical Impression  Patient presents s/p fall with decreased independence and safety with mobility.  She will benefit from skilled PT in the acute setting to allow return to independent living with intermittent daughter assist after SNF level rehab stay.    Follow Up Recommendations SNF    Equipment Recommendations  None recommended by PT    Recommendations for Other Services       Precautions / Restrictions Precautions Precautions: Fall      Mobility  Bed Mobility Overal bed mobility: Needs Assistance Bed Mobility: Supine to Sit;Sit to Supine     Supine to sit: Min guard;HOB elevated Sit to supine: Min assist   General bed mobility comments: assist for positioning in bed  Transfers Overall transfer level: Needs assistance Equipment used: Rolling walker (2 wheeled) Transfers: Sit to/from Stand Sit to Stand: Min assist         General transfer comment: cues for safety  Ambulation/Gait Ambulation/Gait assistance: Min assist Ambulation Distance (Feet): 120 Feet Assistive device: Rolling walker (2 wheeled) Gait Pattern/deviations: Step-through pattern;Drifts right/left;Decreased stride length     General Gait Details: loss of balance on turns assist for safety to negotiate around bed  Stairs            Wheelchair Mobility    Modified Rankin (Stroke Patients Only)       Balance Overall balance assessment: Needs assistance   Sitting balance-Leahy Scale: Good       Standing balance-Leahy Scale: Poor Standing balance comment: UE assist needed for balance                              Pertinent Vitals/Pain Pain Assessment: Faces Faces Pain Scale: Hurts little more Pain Location: right face  and back Pain Intervention(s): Monitored during session    Home Living Family/patient expects to be discharged to:: Skilled nursing facility                      Prior Function Level of Independence: Needs assistance   Gait / Transfers Assistance Needed: independent with walker  ADL's / Homemaking Assistance Needed: assist for bath/meds/walked to meals        Hand Dominance        Extremity/Trunk Assessment               Lower Extremity Assessment: Generalized weakness         Communication   Communication: No difficulties  Cognition Arousal/Alertness: Awake/alert Behavior During Therapy: WFL for tasks assessed/performed Overall Cognitive Status: Impaired/Different from baseline Area of Impairment: Orientation;Memory Orientation Level:  (oriented at times, but fluctuates, at times thinks she is home)   Memory: Decreased short-term memory              General Comments General comments (skin integrity, edema, etc.): daughters initially there and gave extensive history; plan to terminate hospice status to allow her to go through rehab to get back to ILF    Exercises        Assessment/Plan    PT Assessment Patient needs continued PT services  PT Diagnosis Abnormality of  gait;Generalized weakness   PT Problem List Decreased strength;Decreased activity tolerance;Decreased balance;Decreased cognition;Decreased mobility;Decreased safety awareness;Pain  PT Treatment Interventions DME instruction;Gait training;Therapeutic exercise;Functional mobility training;Patient/family education;Therapeutic activities;Balance training   PT Goals (Current goals can be found in the Care Plan section) Acute Rehab PT Goals Patient Stated Goal: To go to rehab to get back to ILF PT Goal Formulation: With patient/family Time For Goal Achievement:  06/19/14 Potential to Achieve Goals: Good    Frequency Min 3X/week   Barriers to discharge        Co-evaluation               End of Session Equipment Utilized During Treatment: Gait belt Activity Tolerance: Patient limited by fatigue Patient left: in bed;with call bell/phone within reach;with bed alarm set           Time: 7628-3151 PT Time Calculation (min) (ACUTE ONLY): 25 min   Charges:   PT Evaluation $Initial PT Evaluation Tier I: 1 Procedure PT Treatments $Gait Training: 8-22 mins   PT G Codes:          [place],CYNDI 2014-06-13, 4:52 PM Magda Kiel, [place] 2014/06/13

## 2014-06-06 ENCOUNTER — Inpatient Hospital Stay (HOSPITAL_COMMUNITY)

## 2014-06-06 LAB — BASIC METABOLIC PANEL
ANION GAP: 13 (ref 5–15)
Anion gap: 12 (ref 5–15)
Anion gap: 12 (ref 5–15)
Anion gap: 11 (ref 5–15)
BUN: 11 mg/dL (ref 6–23)
BUN: 10 mg/dL (ref 6–23)
BUN: 9 mg/dL (ref 6–23)
BUN: 9 mg/dL (ref 6–23)
CO2: 23 meq/L (ref 19–32)
CO2: 24 meq/L (ref 19–32)
CO2: 24 mEq/L (ref 19–32)
CO2: 23 mEq/L (ref 19–32)
Calcium: 8.5 mg/dL (ref 8.4–10.5)
Calcium: 8.8 mg/dL (ref 8.4–10.5)
Calcium: 8.8 mg/dL (ref 8.4–10.5)
Calcium: 8.7 mg/dL (ref 8.4–10.5)
Chloride: 92 mEq/L — ABNORMAL LOW (ref 96–112)
Chloride: 96 mEq/L (ref 96–112)
Chloride: 99 mEq/L (ref 96–112)
Chloride: 98 mEq/L (ref 96–112)
Creatinine, Ser: 0.94 mg/dL (ref 0.50–1.10)
Creatinine, Ser: 0.98 mg/dL (ref 0.50–1.10)
Creatinine, Ser: 0.99 mg/dL (ref 0.50–1.10)
Creatinine, Ser: 0.97 mg/dL (ref 0.50–1.10)
GFR calc Af Amer: 66 mL/min — ABNORMAL LOW (ref >90)
GFR calc Af Amer: 63 mL/min — ABNORMAL LOW (ref >90)
GFR calc Af Amer: 62 mL/min — ABNORMAL LOW (ref >90)
GFR calc non Af Amer: 57 mL/min — ABNORMAL LOW (ref >90)
GFR calc non Af Amer: 54 mL/min — ABNORMAL LOW (ref >90)
GFR, EST AFRICAN AMERICAN: 64 mL/min — AB (ref >90)
GFR, EST NON AFRICAN AMERICAN: 54 mL/min — AB (ref >90)
GFR, EST NON AFRICAN AMERICAN: 55 mL/min — AB (ref >90)
GLUCOSE: 131 mg/dL — AB (ref 70–99)
GLUCOSE: 124 mg/dL — AB (ref 70–99)
GLUCOSE: 152 mg/dL — AB (ref 70–99)
Glucose, Bld: 125 mg/dL — ABNORMAL HIGH (ref 70–99)
POTASSIUM: 4.2 meq/L (ref 3.7–5.3)
POTASSIUM: 4.6 meq/L (ref 3.7–5.3)
POTASSIUM: 4.5 meq/L (ref 3.7–5.3)
Potassium: 4.3 mEq/L (ref 3.7–5.3)
SODIUM: 127 meq/L — AB (ref 137–147)
SODIUM: 132 meq/L — AB (ref 137–147)
SODIUM: 134 meq/L — AB (ref 137–147)
Sodium: 134 mEq/L — ABNORMAL LOW (ref 137–147)

## 2014-06-06 LAB — URINE CULTURE
Colony Count: NO GROWTH
Culture: NO GROWTH

## 2014-06-06 MED ORDER — DOCUSATE SODIUM 100 MG PO CAPS
100.0000 mg | ORAL_CAPSULE | Freq: Every day | ORAL | Status: DC | PRN
  Administered 2014-06-06 – 2014-06-07 (×2): 100 mg via ORAL
  Filled 2014-06-06 (×2): qty 1

## 2014-06-06 NOTE — Progress Notes (Signed)
Subjective: Patient reports headache  Objective: Vital signs in last 24 hours: Temp:  [97.7 F (36.5 C)-98.2 F (36.8 C)] 97.7 F (36.5 C) (11/14 0436) Pulse Rate:  [64-72] 65 (11/14 0436) Resp:  [11-28] 20 (11/14 0436) BP: (105-142)/(38-89) 137/44 mmHg (11/14 0436) SpO2:  [96 %-100 %] 100 % (11/13 1852) Weight:  [68.947 kg (152 lb)] 68.947 kg (152 lb) (11/14 0500)  Intake/Output from previous day: 11/13 0701 - 11/14 0700 In: -  Out: 800 [Urine:800] Intake/Output this shift:    Physical Exam: Stable exam  Lab Results:  Recent Labs  06/05/14 0021 06/05/14 0457  WBC 18.5* 14.8*  HGB 11.8* 10.4*  HCT 34.2* 29.2*  PLT 276 278   BMET  Recent Labs  06/05/14 2327 06/06/14 0449  NA 127* 132*  K 4.2 4.6  CL 92* 96  CO2 23 24  GLUCOSE 125* 131*  BUN 11 10  CREATININE 0.94 0.98  CALCIUM 8.5 8.8    Studies/Results: Dg Chest 2 View  06/05/2014   CLINICAL DATA:  Chest pain beginning today which shortness of breath.  EXAM: CHEST  2 VIEW  COMPARISON:  11/01/2009  FINDINGS: Sternotomy wires unchanged. Lungs are adequately inflated without focal consolidation or effusion. Cardiomediastinal silhouette is within normal. Coronary artery stent unchanged. Remainder of the exam is unchanged.  IMPRESSION: No active cardiopulmonary disease.   Electronically Signed   By: Marin Olp M.D.   On: 06/05/2014 01:03   Ct Head Wo Contrast  06/05/2014   CLINICAL DATA:  Trip and fall on patio this evening. RIGHT periorbital soft tissue swelling and laceration. On Plavix.  EXAM: CT HEAD WITHOUT CONTRAST  CT ORBITS WITHOUT CONTRAST  CT CERVICAL SPINE WITHOUT CONTRAST  TECHNIQUE: Multidetector CT imaging of the head, cervical spine, and orbital structures were performed using the standard protocol without intravenous contrast. Multiplanar CT image reconstructions of the cervical spine and maxillofacial structures were also generated.  COMPARISON:  CT of the head February 21, 2006  FINDINGS: CT  HEAD FINDINGS  The ventricles and sulci are normal for age. No intraparenchymal hemorrhage, mass effect nor midline shift. Patchy supratentorial white matter hypodensities are less than expected for patient's age and though non-specific suggest sequelae of chronic small vessel ischemic disease. No acute large vascular territory infarcts.  3 mm RIGHT frontal subdural hematoma, axial 16/29. Small amount of LEFT frontal subarachnoid blood. Basal cisterns are patent. Moderate calcific atherosclerosis of the carotid siphons. No skull fracture.  CT ORBITS FINDINGS  Orbital walls and rims intact. No acute visualized facial fracture. No destructive bony lesions.  Paranasal sinuses are well aerated. Nasal septum slightly deviates to the RIGHT with bilateral concha bullosa.  Large RIGHT periorbital soft tissue swelling without subcutaneous gas or radiopaque foreign bodies. Ocular globes intact. Status post bilateral ocular lens implants. Preservation the retrobulbar and extra Conal fat. Normal appearance optic nerve sheath complexes. Normal appearance of the extraocular muscles.  CT CERVICAL SPINE FINDINGS  Cervical vertebral bodies and posterior elements are intact. Minimal grade 1 C3-4 retrolisthesis on degenerative basis. Severe C3-4 through C7-T1 degenerative discs. C1-2 articulation maintained with severe arthropathy. Minimal calcification about the odontoid process may be seen with CPPD. No destructive bony lesions. Moderate calcific atherosclerosis of the carotid arteries.  IMPRESSION: CT HEAD: 3 mm RIGHT frontal subdural hematoma without mass effect. Small amount of LEFT frontal subarachnoid blood.  Involutional changes. Mild white matter changes suggest chronic small vessel ischemic disease.  CT ORBITS: RIGHT periorbital soft tissue swelling without postseptal hematoma. No  a acute orbital fracture.  CT CERVICAL SPINE: No acute fracture. Minimal grade 1 C3-4 retrolisthesis on degenerative basis.  Acute findings  discussed with and reconfirmed by Dr.ADELEKE ONI on 06/05/2014 at 1:00 am.   Electronically Signed   By: Elon Alas   On: 06/05/2014 01:05   Ct Cervical Spine Wo Contrast  06/05/2014   CLINICAL DATA:  Trip and fall on patio this evening. RIGHT periorbital soft tissue swelling and laceration. On Plavix.  EXAM: CT HEAD WITHOUT CONTRAST  CT ORBITS WITHOUT CONTRAST  CT CERVICAL SPINE WITHOUT CONTRAST  TECHNIQUE: Multidetector CT imaging of the head, cervical spine, and orbital structures were performed using the standard protocol without intravenous contrast. Multiplanar CT image reconstructions of the cervical spine and maxillofacial structures were also generated.  COMPARISON:  CT of the head February 21, 2006  FINDINGS: CT HEAD FINDINGS  The ventricles and sulci are normal for age. No intraparenchymal hemorrhage, mass effect nor midline shift. Patchy supratentorial white matter hypodensities are less than expected for patient's age and though non-specific suggest sequelae of chronic small vessel ischemic disease. No acute large vascular territory infarcts.  3 mm RIGHT frontal subdural hematoma, axial 16/29. Small amount of LEFT frontal subarachnoid blood. Basal cisterns are patent. Moderate calcific atherosclerosis of the carotid siphons. No skull fracture.  CT ORBITS FINDINGS  Orbital walls and rims intact. No acute visualized facial fracture. No destructive bony lesions.  Paranasal sinuses are well aerated. Nasal septum slightly deviates to the RIGHT with bilateral concha bullosa.  Large RIGHT periorbital soft tissue swelling without subcutaneous gas or radiopaque foreign bodies. Ocular globes intact. Status post bilateral ocular lens implants. Preservation the retrobulbar and extra Conal fat. Normal appearance optic nerve sheath complexes. Normal appearance of the extraocular muscles.  CT CERVICAL SPINE FINDINGS  Cervical vertebral bodies and posterior elements are intact. Minimal grade 1 C3-4  retrolisthesis on degenerative basis. Severe C3-4 through C7-T1 degenerative discs. C1-2 articulation maintained with severe arthropathy. Minimal calcification about the odontoid process may be seen with CPPD. No destructive bony lesions. Moderate calcific atherosclerosis of the carotid arteries.  IMPRESSION: CT HEAD: 3 mm RIGHT frontal subdural hematoma without mass effect. Small amount of LEFT frontal subarachnoid blood.  Involutional changes. Mild white matter changes suggest chronic small vessel ischemic disease.  CT ORBITS: RIGHT periorbital soft tissue swelling without postseptal hematoma. No a acute orbital fracture.  CT CERVICAL SPINE: No acute fracture. Minimal grade 1 C3-4 retrolisthesis on degenerative basis.  Acute findings discussed with and reconfirmed by Dr.ADELEKE ONI on 06/05/2014 at 1:00 am.   Electronically Signed   By: Elon Alas   On: 06/05/2014 01:05   Ct Orbitss W/o Cm  06/05/2014   CLINICAL DATA:  Trip and fall on patio this evening. RIGHT periorbital soft tissue swelling and laceration. On Plavix.  EXAM: CT HEAD WITHOUT CONTRAST  CT ORBITS WITHOUT CONTRAST  CT CERVICAL SPINE WITHOUT CONTRAST  TECHNIQUE: Multidetector CT imaging of the head, cervical spine, and orbital structures were performed using the standard protocol without intravenous contrast. Multiplanar CT image reconstructions of the cervical spine and maxillofacial structures were also generated.  COMPARISON:  CT of the head February 21, 2006  FINDINGS: CT HEAD FINDINGS  The ventricles and sulci are normal for age. No intraparenchymal hemorrhage, mass effect nor midline shift. Patchy supratentorial white matter hypodensities are less than expected for patient's age and though non-specific suggest sequelae of chronic small vessel ischemic disease. No acute large vascular territory infarcts.  3 mm RIGHT  frontal subdural hematoma, axial 16/29. Small amount of LEFT frontal subarachnoid blood. Basal cisterns are patent.  Moderate calcific atherosclerosis of the carotid siphons. No skull fracture.  CT ORBITS FINDINGS  Orbital walls and rims intact. No acute visualized facial fracture. No destructive bony lesions.  Paranasal sinuses are well aerated. Nasal septum slightly deviates to the RIGHT with bilateral concha bullosa.  Large RIGHT periorbital soft tissue swelling without subcutaneous gas or radiopaque foreign bodies. Ocular globes intact. Status post bilateral ocular lens implants. Preservation the retrobulbar and extra Conal fat. Normal appearance optic nerve sheath complexes. Normal appearance of the extraocular muscles.  CT CERVICAL SPINE FINDINGS  Cervical vertebral bodies and posterior elements are intact. Minimal grade 1 C3-4 retrolisthesis on degenerative basis. Severe C3-4 through C7-T1 degenerative discs. C1-2 articulation maintained with severe arthropathy. Minimal calcification about the odontoid process may be seen with CPPD. No destructive bony lesions. Moderate calcific atherosclerosis of the carotid arteries.  IMPRESSION: CT HEAD: 3 mm RIGHT frontal subdural hematoma without mass effect. Small amount of LEFT frontal subarachnoid blood.  Involutional changes. Mild white matter changes suggest chronic small vessel ischemic disease.  CT ORBITS: RIGHT periorbital soft tissue swelling without postseptal hematoma. No a acute orbital fracture.  CT CERVICAL SPINE: No acute fracture. Minimal grade 1 C3-4 retrolisthesis on degenerative basis.  Acute findings discussed with and reconfirmed by Dr.ADELEKE ONI on 06/05/2014 at 1:00 am.   Electronically Signed   By: Elon Alas   On: 06/05/2014 01:05    Assessment/Plan: Repeat Head CT to assess SDH per Dr. Harley Hallmark recommendations.  Patient stable clinically.    LOS: 2 days    Peggyann Shoals, MD 06/06/2014, 7:17 AM

## 2014-06-06 NOTE — Plan of Care (Signed)
Problem: Phase I Progression Outcomes Goal: Pain controlled with appropriate interventions Outcome: Completed/Met Date Met:  06/06/14 Goal: Initial discharge plan identified Outcome: Completed/Met Date Met:  06/06/14     

## 2014-06-06 NOTE — Progress Notes (Signed)
[facility] PROGRESS NOTE  Carla Friedman MWU:[id number] DOB: July 16, 1934 DOA: 06/04/2014 PCP: Laurey Morale, MD  Assessment/Plan: 1. SDH (subdural hematoma) -very small, conservative mgt, NSG following -plan for repeat CT head -not likely to be causing any of her confusion   2. UTI-partially treated, was on Bactrim at facility -continue ceftriaxone  -suspect that urine Cx will be negative since this was partially treated  3.hyponatremia -due to hypovolemia, improving -stop IVF tomorrow  4.coronary artery disease s/p CABG, Peripheral vascular disease -hold ASA/plavix   5. Dementia -memory worsening per daughter, will likely need higher level of care, currently at ILF -Pt/Ot eval  Advance goals of care discussion: DNR/DNI on hospice   Consults: neurosurgery  DVT Prophylaxis: mechanical compression device  Code Status: DNR Family Communication: d/w daughetr at bedside Disposition Plan: will need higher level of care ALF vs SNF   Consultants:  NSG  Antibiotics:  Caftriaxone  HPI/Subjective: Some confusion overnight, c/o neuropathy in legs-chronic  Objective: Filed Vitals:   06/06/14 0940  BP: 110/30  Pulse: 59  Temp: 98.2 F (36.8 C)  Resp: 16    Intake/Output Summary (Last 24 hours) at 06/06/14 1033 Last data filed at 06/06/14 0430  Gross per 24 hour  Intake      0 ml  Output    800 ml  Net   -800 ml   Filed Weights   06/06/14 0500  Weight: 68.947 kg (152 lb)    Exam:   General: AAOx to self and to [place], no distress  HEENT: PERRLA, bruising around R eye and sutures above  Cardiovascular: S1S2/RRR  Respiratory: CTAB  Abdomen: soft, Nt, BS present  Musculoskeletal:no edema c/c  Neuro: non focal  Data Reviewed: Basic Metabolic Panel:  Recent Labs Lab 06/05/14 0923 06/05/14 1200 06/05/14 1626 06/05/14 2327 06/06/14 0449  NA 126* 125* 126* 127* 132*  K 4.7 5.1 4.4 4.2 4.6  CL 90* 91* 91* 92* 96  CO2 23 22 23 23  24   GLUCOSE 151* 136* 151* 125* 131*  BUN 12 12 12 11 10   CREATININE 0.94 0.94 0.98 0.94 0.98  CALCIUM 8.6 8.8 8.6 8.5 8.8   Liver Function Tests:  Recent Labs Lab 06/05/14 0021 06/05/14 0457  AST 15 13  ALT 11 10  ALKPHOS 58 49  BILITOT 0.4 0.4  PROT 6.6 5.7*  ALBUMIN 3.6 3.2*    Recent Labs Lab 06/05/14 0021  LIPASE 14   No results for input(s): AMMONIA in the last 168 hours. CBC:  Recent Labs Lab 06/05/14 0021 06/05/14 0457  WBC 18.5* 14.8*  NEUTROABS 16.8*  --   HGB 11.8* 10.4*  HCT 34.2* 29.2*  MCV 85.5 83.0  PLT 276 278   Cardiac Enzymes:  Recent Labs Lab 06/05/14 0457  TROPONINI <0.30   BNP (last 3 results) No results for input(s): PROBNP in the last 8760 hours. CBG:  Recent Labs Lab 06/05/14 0509 06/05/14 0642  GLUCAP 173* 148*    No results found for this or any previous visit (from the past 240 hour(s)).   Studies: Dg Chest 2 View  06/05/2014   CLINICAL DATA:  Chest pain beginning today which shortness of breath.  EXAM: CHEST  2 VIEW  COMPARISON:  11/01/2009  FINDINGS: Sternotomy wires unchanged. Lungs are adequately inflated without focal consolidation or effusion. Cardiomediastinal silhouette is within normal. Coronary artery stent unchanged. Remainder of the exam is unchanged.  IMPRESSION: No active cardiopulmonary disease.   Electronically Signed   By: Marin Olp  M.D.   On: 06/05/2014 01:03   Ct Head Wo Contrast  06/05/2014   CLINICAL DATA:  Trip and fall on patio this evening. RIGHT periorbital soft tissue swelling and laceration. On Plavix.  EXAM: CT HEAD WITHOUT CONTRAST  CT ORBITS WITHOUT CONTRAST  CT CERVICAL SPINE WITHOUT CONTRAST  TECHNIQUE: Multidetector CT imaging of the head, cervical spine, and orbital structures were performed using the standard protocol without intravenous contrast. Multiplanar CT image reconstructions of the cervical spine and maxillofacial structures were also generated.  COMPARISON:  CT of the head  February 21, 2006  FINDINGS: CT HEAD FINDINGS  The ventricles and sulci are normal for age. No intraparenchymal hemorrhage, mass effect nor midline shift. Patchy supratentorial white matter hypodensities are less than expected for patient's age and though non-specific suggest sequelae of chronic small vessel ischemic disease. No acute large vascular territory infarcts.  3 mm RIGHT frontal subdural hematoma, axial 16/29. Small amount of LEFT frontal subarachnoid blood. Basal cisterns are patent. Moderate calcific atherosclerosis of the carotid siphons. No skull fracture.  CT ORBITS FINDINGS  Orbital walls and rims intact. No acute visualized facial fracture. No destructive bony lesions.  Paranasal sinuses are well aerated. Nasal septum slightly deviates to the RIGHT with bilateral concha bullosa.  Large RIGHT periorbital soft tissue swelling without subcutaneous gas or radiopaque foreign bodies. Ocular globes intact. Status post bilateral ocular lens implants. Preservation the retrobulbar and extra Conal fat. Normal appearance optic nerve sheath complexes. Normal appearance of the extraocular muscles.  CT CERVICAL SPINE FINDINGS  Cervical vertebral bodies and posterior elements are intact. Minimal grade 1 C3-4 retrolisthesis on degenerative basis. Severe C3-4 through C7-T1 degenerative discs. C1-2 articulation maintained with severe arthropathy. Minimal calcification about the odontoid process may be seen with CPPD. No destructive bony lesions. Moderate calcific atherosclerosis of the carotid arteries.  IMPRESSION: CT HEAD: 3 mm RIGHT frontal subdural hematoma without mass effect. Small amount of LEFT frontal subarachnoid blood.  Involutional changes. Mild white matter changes suggest chronic small vessel ischemic disease.  CT ORBITS: RIGHT periorbital soft tissue swelling without postseptal hematoma. No a acute orbital fracture.  CT CERVICAL SPINE: No acute fracture. Minimal grade 1 C3-4 retrolisthesis on degenerative  basis.  Acute findings discussed with and reconfirmed by Dr.ADELEKE ONI on 06/05/2014 at 1:00 am.   Electronically Signed   By: Elon Alas   On: 06/05/2014 01:05   Ct Cervical Spine Wo Contrast  06/05/2014   CLINICAL DATA:  Trip and fall on patio this evening. RIGHT periorbital soft tissue swelling and laceration. On Plavix.  EXAM: CT HEAD WITHOUT CONTRAST  CT ORBITS WITHOUT CONTRAST  CT CERVICAL SPINE WITHOUT CONTRAST  TECHNIQUE: Multidetector CT imaging of the head, cervical spine, and orbital structures were performed using the standard protocol without intravenous contrast. Multiplanar CT image reconstructions of the cervical spine and maxillofacial structures were also generated.  COMPARISON:  CT of the head February 21, 2006  FINDINGS: CT HEAD FINDINGS  The ventricles and sulci are normal for age. No intraparenchymal hemorrhage, mass effect nor midline shift. Patchy supratentorial white matter hypodensities are less than expected for patient's age and though non-specific suggest sequelae of chronic small vessel ischemic disease. No acute large vascular territory infarcts.  3 mm RIGHT frontal subdural hematoma, axial 16/29. Small amount of LEFT frontal subarachnoid blood. Basal cisterns are patent. Moderate calcific atherosclerosis of the carotid siphons. No skull fracture.  CT ORBITS FINDINGS  Orbital walls and rims intact. No acute visualized facial fracture. No destructive  bony lesions.  Paranasal sinuses are well aerated. Nasal septum slightly deviates to the RIGHT with bilateral concha bullosa.  Large RIGHT periorbital soft tissue swelling without subcutaneous gas or radiopaque foreign bodies. Ocular globes intact. Status post bilateral ocular lens implants. Preservation the retrobulbar and extra Conal fat. Normal appearance optic nerve sheath complexes. Normal appearance of the extraocular muscles.  CT CERVICAL SPINE FINDINGS  Cervical vertebral bodies and posterior elements are intact. Minimal  grade 1 C3-4 retrolisthesis on degenerative basis. Severe C3-4 through C7-T1 degenerative discs. C1-2 articulation maintained with severe arthropathy. Minimal calcification about the odontoid process may be seen with CPPD. No destructive bony lesions. Moderate calcific atherosclerosis of the carotid arteries.  IMPRESSION: CT HEAD: 3 mm RIGHT frontal subdural hematoma without mass effect. Small amount of LEFT frontal subarachnoid blood.  Involutional changes. Mild white matter changes suggest chronic small vessel ischemic disease.  CT ORBITS: RIGHT periorbital soft tissue swelling without postseptal hematoma. No a acute orbital fracture.  CT CERVICAL SPINE: No acute fracture. Minimal grade 1 C3-4 retrolisthesis on degenerative basis.  Acute findings discussed with and reconfirmed by Dr.ADELEKE ONI on 06/05/2014 at 1:00 am.   Electronically Signed   By: Elon Alas   On: 06/05/2014 01:05   Ct Orbitss W/o Cm  06/05/2014   CLINICAL DATA:  Trip and fall on patio this evening. RIGHT periorbital soft tissue swelling and laceration. On Plavix.  EXAM: CT HEAD WITHOUT CONTRAST  CT ORBITS WITHOUT CONTRAST  CT CERVICAL SPINE WITHOUT CONTRAST  TECHNIQUE: Multidetector CT imaging of the head, cervical spine, and orbital structures were performed using the standard protocol without intravenous contrast. Multiplanar CT image reconstructions of the cervical spine and maxillofacial structures were also generated.  COMPARISON:  CT of the head February 21, 2006  FINDINGS: CT HEAD FINDINGS  The ventricles and sulci are normal for age. No intraparenchymal hemorrhage, mass effect nor midline shift. Patchy supratentorial white matter hypodensities are less than expected for patient's age and though non-specific suggest sequelae of chronic small vessel ischemic disease. No acute large vascular territory infarcts.  3 mm RIGHT frontal subdural hematoma, axial 16/29. Small amount of LEFT frontal subarachnoid blood. Basal cisterns are  patent. Moderate calcific atherosclerosis of the carotid siphons. No skull fracture.  CT ORBITS FINDINGS  Orbital walls and rims intact. No acute visualized facial fracture. No destructive bony lesions.  Paranasal sinuses are well aerated. Nasal septum slightly deviates to the RIGHT with bilateral concha bullosa.  Large RIGHT periorbital soft tissue swelling without subcutaneous gas or radiopaque foreign bodies. Ocular globes intact. Status post bilateral ocular lens implants. Preservation the retrobulbar and extra Conal fat. Normal appearance optic nerve sheath complexes. Normal appearance of the extraocular muscles.  CT CERVICAL SPINE FINDINGS  Cervical vertebral bodies and posterior elements are intact. Minimal grade 1 C3-4 retrolisthesis on degenerative basis. Severe C3-4 through C7-T1 degenerative discs. C1-2 articulation maintained with severe arthropathy. Minimal calcification about the odontoid process may be seen with CPPD. No destructive bony lesions. Moderate calcific atherosclerosis of the carotid arteries.  IMPRESSION: CT HEAD: 3 mm RIGHT frontal subdural hematoma without mass effect. Small amount of LEFT frontal subarachnoid blood.  Involutional changes. Mild white matter changes suggest chronic small vessel ischemic disease.  CT ORBITS: RIGHT periorbital soft tissue swelling without postseptal hematoma. No a acute orbital fracture.  CT CERVICAL SPINE: No acute fracture. Minimal grade 1 C3-4 retrolisthesis on degenerative basis.  Acute findings discussed with and reconfirmed by Dr.ADELEKE ONI on 06/05/2014 at 1:00 am.  Electronically Signed   By: Elon Alas   On: 06/05/2014 01:05    Scheduled Meds: . amitriptyline  50 mg Oral QHS  . atenolol  50 mg Oral [id number]  . cefTRIAXone (ROCEPHIN)  IV  1 g Intravenous Q24H  . gabapentin  100 mg Oral BID  . isosorbide dinitrate  30 mg Oral BID WC  . ondansetron (ZOFRAN) IV  8 mg Intravenous Once  . pravastatin  40 mg Oral QPM  . ramipril  10 mg  Oral Daily  . ranolazine  500 mg Oral BID  . sodium chloride  3 mL Intravenous Q12H   Continuous Infusions: . sodium chloride 50 mL/hr at 06/05/14 0451   Antibiotics Given (last 72 hours)    None      Principal Problem:   SDH (subdural hematoma) Active Problems:   Essential hypertension   Coronary atherosclerosis   Peripheral vascular disease   Memory loss   SAH (subarachnoid hemorrhage)   Diabetes mellitus type 2 in nonobese    Time spent:3min    [facility]  [facility] Pager [phone]. If 7PM-7AM, please contact night-coverage at [email or web address], password [email or web address] 06/06/2014, 10:33 AM  LOS: 2 days

## 2014-06-07 ENCOUNTER — Other Ambulatory Visit: Payer: Self-pay | Admitting: Family Medicine

## 2014-06-07 LAB — BASIC METABOLIC PANEL
Anion gap: 12 (ref 5–15)
BUN: 8 mg/dL (ref 6–23)
CO2: 22 mEq/L (ref 19–32)
Calcium: 8.4 mg/dL (ref 8.4–10.5)
Chloride: 101 mEq/L (ref 96–112)
Creatinine, Ser: 0.88 mg/dL (ref 0.50–1.10)
GFR, EST AFRICAN AMERICAN: 72 mL/min — AB (ref >90)
GFR, EST NON AFRICAN AMERICAN: 62 mL/min — AB (ref >90)
GLUCOSE: 133 mg/dL — AB (ref 70–99)
POTASSIUM: 3.9 meq/L (ref 3.7–5.3)
SODIUM: 135 meq/L — AB (ref 137–147)

## 2014-06-07 NOTE — Progress Notes (Signed)
[facility] PROGRESS NOTE  Carla Friedman UTM:[id number] DOB: Mar 20, 1934 DOA: 06/04/2014 PCP: Laurey Morale, MD  Assessment/Plan: 1. SDH (subdural hematoma) -very small, conservative mgt, NSG following -repeat CT scan ok -not likely to be causing any of her confusion   2. UTI-partially treated, was on Bactrim at facility -continue ceftriaxone  -suspect that urine Cx will be negative since this was partially treated  3.hyponatremia -due to hypovolemia, improving -d/c IVF  4.coronary artery disease s/p CABG, Peripheral vascular disease -hold ASA/plavix   5. Dementia -memory worsening per daughter, will likely need higher level of care, currently at ILF -SNF  Advance goals of care discussion: DNR/DNI on hospice   Consults: neurosurgery  DVT Prophylaxis: mechanical compression device  Code Status: DNR Family Communication: d/w daughters at bedside Disposition Plan: SNF   Consultants:  NSG  Antibiotics:  Caftriaxone  HPI/Subjective: Doing better this AM Some tenderness on right outside hip area  Objective: Filed Vitals:   06/07/14 1006  BP: 136/60  Pulse: 60  Temp: 97.8 F (36.6 C)  Resp: 18   No intake or output data in the 24 hours ending 06/07/14 1225 Filed Weights   06/06/14 0500 06/06/14 2340 06/07/14 0500  Weight: 68.947 kg (152 lb) 68.357 kg (150 lb 11.2 oz) 68.357 kg (150 lb 11.2 oz)    Exam:   General: AAOx to self and to [place], no distress  HEENT: PERRLA, bruising around R eye and sutures above  Cardiovascular: S1S2/RRR  Respiratory: CTAB  Abdomen: soft, Nt, BS present  Musculoskeletal:no edema c/c  Neuro: non focal  Data Reviewed: Basic Metabolic Panel:  Recent Labs Lab 06/05/14 2327 06/06/14 0449 06/06/14 1120 06/06/14 1714 06/07/14 0620  NA 127* 132* 134* 134* 135*  K 4.2 4.6 4.5 4.3 3.9  CL 92* 96 99 98 101  CO2 23 24 24 23 22   GLUCOSE 125* 131* 124* 152* 133*  BUN 11 10 9 9 8   CREATININE 0.94 0.98  0.99 0.97 0.88  CALCIUM 8.5 8.8 8.8 8.7 8.4   Liver Function Tests:  Recent Labs Lab 06/05/14 0021 06/05/14 0457  AST 15 13  ALT 11 10  ALKPHOS 58 49  BILITOT 0.4 0.4  PROT 6.6 5.7*  ALBUMIN 3.6 3.2*    Recent Labs Lab 06/05/14 0021  LIPASE 14   No results for input(s): AMMONIA in the last 168 hours. CBC:  Recent Labs Lab 06/05/14 0021 06/05/14 0457  WBC 18.5* 14.8*  NEUTROABS 16.8*  --   HGB 11.8* 10.4*  HCT 34.2* 29.2*  MCV 85.5 83.0  PLT 276 278   Cardiac Enzymes:  Recent Labs Lab 06/05/14 0457  TROPONINI <0.30   BNP (last 3 results) No results for input(s): PROBNP in the last 8760 hours. CBG:  Recent Labs Lab 06/05/14 0509 06/05/14 0642  GLUCAP 173* 148*    Recent Results (from the past 240 hour(s))  Urine culture     Status: None   Collection Time: 06/05/14  1:44 AM  Result Value Ref Range Status   Specimen Description URINE, CLEAN CATCH  Final   Special Requests NONE  Final   Culture  Setup Time   Final    06/05/2014 09:09 Performed at [place] Performed at [facility]   Final   Culture NO GROWTH Performed at [facility]   Final   Report Status 06/06/2014 FINAL  Final     Studies: Ct Head Wo Contrast  06/06/2014  CLINICAL DATA:  Follow-up subdural hematoma, increased confusion  EXAM: CT HEAD WITHOUT CONTRAST  TECHNIQUE: Contiguous axial images were obtained from the base of the skull through the vertex without intravenous contrast.  COMPARISON:  06/05/2014  FINDINGS: Again noted right periorbital soft tissue swelling with small subcutaneous supraorbital hematoma measures 1.6 cm length by 7 mm thickness. Tiny amount subdural hemorrhage right frontal region is stable measures about 2.8 mm thickness. Again noted small amount of subarachnoid hemorrhage in left frontal lobe without significant change. There is no mass effect or midline shift. Ventricular size is stable from prior  exam. Stable atrophy and chronic white matter disease. No intraventricular hemorrhage.  IMPRESSION: 1. Again noted soft tissue swelling right periorbital region with small subcutaneous supraorbital hematoma. 2. Stable small right frontal subdural hemorrhage measures 2.8 mm in thickness without significant change. No significant change in small subarachnoid hemorrhage in left frontal lobe. No mass effect or midline shift. No new hemorrhage is noted. No intraventricular hemorrhage. Stable atrophy and chronic white matter disease.   Electronically Signed   By: Lahoma Crocker M.D.   On: 06/06/2014 16:21    Scheduled Meds: . amitriptyline  50 mg Oral QHS  . atenolol  50 mg Oral BH-q7a  . cefTRIAXone (ROCEPHIN)  IV  1 g Intravenous Q24H  . gabapentin  100 mg Oral BID  . isosorbide dinitrate  30 mg Oral BID WC  . ondansetron (ZOFRAN) IV  8 mg Intravenous Once  . pravastatin  40 mg Oral QPM  . ramipril  10 mg Oral Daily  . ranolazine  500 mg Oral BID  . sodium chloride  3 mL Intravenous Q12H   Continuous Infusions: . sodium chloride 50 mL/hr at 06/07/14 0606   Antibiotics Given (last 72 hours)    None      Principal Problem:   SDH (subdural hematoma) Active Problems:   Essential hypertension   Coronary atherosclerosis   Peripheral vascular disease   Memory loss   SAH (subarachnoid hemorrhage)   Diabetes mellitus type 2 in nonobese    Time spent:32min    Eliseo Squires, JESSICA  [facility] Pager [phone]. If 7PM-7AM, please contact night-coverage at [email or web address], password [facility] 06/07/2014, 12:25 PM  LOS: 3 days

## 2014-06-07 NOTE — Progress Notes (Signed)
Occupational Therapy Evaluation Patient Details Name: Carla Friedman MRN: [id number] DOB: 1933-10-13 Today's Date: 06/07/2014    History of Present Illness Patient is a 80 y/o with CHF admitted after a fall at her independent living facitilty with right facial laceration and contusion and elbow contusion.  She also had UTI and was vomiting after taking antibiotics/   Clinical Impression   PTA pt lived at [facility] and required assistance for baths and medication management and ambulated to meals with use of RW. Pt currently presents with generalized weakness which impairs her independence with ADLs. Pt's cognition is improved with some short term memory deficits at baseline. Pt will benefit from ST rehab at SNF to enable her to return to [place]. Pt will benefit from acute OT to address independence with LB ADLs and functional mobility.     Follow Up Recommendations  SNF;Supervision/Assistance - 24 hour    Equipment Recommendations  None recommended by OT    Recommendations for Other Services       Precautions / Restrictions Precautions Precautions: Fall Restrictions Weight Bearing Restrictions: No      Mobility Bed Mobility Overal bed mobility: Needs Assistance Bed Mobility: Supine to Sit     Supine to sit: Supervision;HOB elevated     General bed mobility comments: Supervision for safety.   Transfers Overall transfer level: Needs assistance Equipment used: Rolling walker (2 wheeled) Transfers: Sit to/from Stand Sit to Stand: Min guard         General transfer comment: Min guard for safety and balance. No physical assist needed. VC's for hand placement and technique.         ADL Overall ADL's : Needs assistance/impaired Eating/Feeding: Independent;Sitting   Grooming: Min guard;Standing   Upper Body Bathing: Set up;Sitting   Lower Body Bathing: Minimal assistance;Sit to/from stand   Upper Body Dressing : Set up;Sitting   Lower Body  Dressing: Moderate assistance;Sit to/from stand   Toilet Transfer: Min guard;Ambulation;Comfort height toilet;RW   Toileting- Water quality scientist and Hygiene: Min guard;Sit to/from stand       Functional mobility during ADLs: Min guard;Rolling walker General ADL Comments: Pt limited by generalized weakness, however cognition is resolving. Pt demonstrates some short term memory deficits, which are baseline per daughter.      Vision  Pt reports no change from baseline.                    Perception Perception Perception Tested?: No   Praxis Praxis Praxis tested?: Within functional limits    Pertinent Vitals/Pain Pain Assessment: No/denies pain     Hand Dominance Right   Extremity/Trunk Assessment Upper Extremity Assessment Upper Extremity Assessment: Generalized weakness   Lower Extremity Assessment Lower Extremity Assessment: Generalized weakness   Cervical / Trunk Assessment Cervical / Trunk Assessment: Normal   Communication Communication Communication: No difficulties   Cognition Arousal/Alertness: Awake/alert Behavior During Therapy: WFL for tasks assessed/performed Overall Cognitive Status: Within Functional Limits for tasks assessed Area of Impairment: Memory     Memory: Decreased short-term memory                        Home Living Family/patient expects to be discharged to:: Skilled nursing facility                                 Additional Comments: Pt and daughter would like ST rehab at SNF to  enable pt to return to ILF.       Prior Functioning/Environment Level of Independence: Needs assistance  Gait / Transfers Assistance Needed: independent with walker ADL's / Homemaking Assistance Needed: assist for bath/meds/walked to meals        OT Diagnosis: Generalized weakness   OT Problem List: Decreased strength;Decreased activity tolerance;Impaired balance (sitting and/or standing)   OT Treatment/Interventions:  Self-care/ADL training;Therapeutic exercise;Energy conservation;DME and/or AE instruction;Therapeutic activities;Patient/family education;Balance training    OT Goals(Current goals can be found in the care plan section) Acute Rehab OT Goals Patient Stated Goal: to get strong enough to go back home after rehab OT Goal Formulation: With patient/family Time For Goal Achievement: 06/21/14 Potential to Achieve Goals: Good ADL Goals Pt Will Perform Grooming: with supervision;standing Pt Will Perform Lower Body Bathing: with set-up;with supervision;sit to/from stand Pt Will Perform Lower Body Dressing: with set-up;with supervision;sit to/from stand Pt Will Transfer to Toilet: with supervision;ambulating;bedside commode Pt Will Perform Toileting - Clothing Manipulation and hygiene: with supervision;sit to/from stand  OT Frequency: Min 2X/week    End of Session Equipment Utilized During Treatment: Gait belt;Rolling walker  Activity Tolerance: Patient tolerated treatment well Patient left: in chair;with call bell/phone within reach;with family/visitor present;with nursing/sitter in room   Time: 2202-5427 OT Time Calculation (min): 60 min Charges:  OT General Charges $OT Visit: 1 Procedure OT Evaluation $Initial OT Evaluation Tier I: 1 Procedure OT Treatments $Self Care/Home Management : 38-52 mins  Juluis Rainier 06/07/2014, 4:32 PM   Cyndie Chime, OTR/L Occupational Therapist [phone] (pager)

## 2014-06-07 NOTE — Progress Notes (Signed)
Patient ID: Carla Friedman, female   DOB: July 08, 1934, 80 y.o.   MRN: [id number] BP 136/60 mmHg  Pulse 60  Temp(Src) 97.8 F (36.6 C) (Oral)  Resp 18  Ht 5\' 4"  (1.626 m)  Wt 68.357 kg (150 lb 11.2 oz)  BMI 25.85 kg/m2  SpO2 100% Alert and oriented x 4, speech is clear, and fluent perrl full eom Symmetric facies, tongue and uvula midline Moving all extremities well Resolving bruising on face and neck CT is unchanged.

## 2014-06-07 NOTE — Progress Notes (Signed)
GIP visit Fransico Him [id number] RN  Related admission to [facility] diagnosis of Heart Disease. Patient is a DNR. Patient sitting up in the bed with daughter at bedside. Patient is alert and oriented and has no complaints at this time. Daughters stated that they are working towards [employer] or Tuesday. Patient is agreeable to plan.   Please call [facility] @ [phone]- with any hospice needs

## 2014-06-07 NOTE — Progress Notes (Signed)
GIP visit- Carla Friedman of [facility] RN Visit-Erin Maxwell Caul  RN  Related admission to [facility] diagnosis of Heart Disease.  Pt is DNR code. Patient sitting in bed with daughters Carla Friedman and Carla Friedman at bedside. Daughters states that patient was very confused and agitated this morning. Upon arrival patient was very pleasant answering questions appropriately. Patient denies any pain other than some pain in her head near her eye. Spoke with daughters in length about discharge plans, daughter want her to go to rehab and use MedA days and then when her MedA days are up apply for medicaid in the process and find a SNF bed with medicaid if unable to go back to [place] with hospice. Patient would want hospice at SNF with medicaid. Daughter very appreciative of visit and encouraged to use [place] as support and call us with any concerns. Patient receiving IV antibiotics and IV fluids.   Please call [facility] @ [phone]- with any hospice needs

## 2014-06-07 NOTE — Plan of Care (Signed)
Problem: Phase I Progression Outcomes Goal: Adequate ventilation/oxygenation Outcome: Completed/Met Date Met:  06/07/14 Goal: Hemodynamically stable Outcome: Completed/Met Date Met:  06/07/14 Goal: Bedrest HOB at 30 degrees Outcome: Completed/Met Date Met:  06/07/14 Goal: Voiding-avoid urinary catheter unless indicated Outcome: Completed/Met Date Met:  06/07/14  Problem: Phase II Progression Outcomes Goal: Pain controlled Outcome: Completed/Met Date Met:  06/07/14 Goal: Bedrest HOB at 30 degrees Outcome: Completed/Met Date Met:  06/07/14 Goal: Discharge plan established Outcome: Completed/Met Date Met:  06/07/14 Goal: Tube feedings started Outcome: Not Applicable Date Met:  40/10/27 Goal: Effective improved ventilation & oxygenation Outcome: Completed/Met Date Met:  06/07/14 Goal: Lurline Idol considered if not weaned Outcome: Not Applicable Date Met:  25/36/64

## 2014-06-08 DIAGNOSIS — I2510 Atherosclerotic heart disease of native coronary artery without angina pectoris: Secondary | ICD-10-CM | POA: Diagnosis not present

## 2014-06-08 DIAGNOSIS — N390 Urinary tract infection, site not specified: Secondary | ICD-10-CM | POA: Diagnosis not present

## 2014-06-08 DIAGNOSIS — I6200 Nontraumatic subdural hemorrhage, unspecified: Secondary | ICD-10-CM | POA: Diagnosis not present

## 2014-06-08 DIAGNOSIS — R05 Cough: Secondary | ICD-10-CM | POA: Diagnosis not present

## 2014-06-08 DIAGNOSIS — R488 Other symbolic dysfunctions: Secondary | ICD-10-CM | POA: Diagnosis not present

## 2014-06-08 DIAGNOSIS — E568 Deficiency of other vitamins: Secondary | ICD-10-CM | POA: Diagnosis not present

## 2014-06-08 DIAGNOSIS — I200 Unstable angina: Secondary | ICD-10-CM | POA: Diagnosis not present

## 2014-06-08 DIAGNOSIS — R413 Other amnesia: Secondary | ICD-10-CM | POA: Diagnosis not present

## 2014-06-08 DIAGNOSIS — I150 Renovascular hypertension: Secondary | ICD-10-CM | POA: Diagnosis not present

## 2014-06-08 DIAGNOSIS — B001 Herpesviral vesicular dermatitis: Secondary | ICD-10-CM | POA: Diagnosis not present

## 2014-06-08 DIAGNOSIS — R279 Unspecified lack of coordination: Secondary | ICD-10-CM | POA: Diagnosis not present

## 2014-06-08 DIAGNOSIS — R0789 Other chest pain: Secondary | ICD-10-CM | POA: Diagnosis not present

## 2014-06-08 DIAGNOSIS — E119 Type 2 diabetes mellitus without complications: Secondary | ICD-10-CM | POA: Diagnosis not present

## 2014-06-08 DIAGNOSIS — R0600 Dyspnea, unspecified: Secondary | ICD-10-CM | POA: Diagnosis not present

## 2014-06-08 DIAGNOSIS — G8929 Other chronic pain: Secondary | ICD-10-CM | POA: Diagnosis not present

## 2014-06-08 DIAGNOSIS — R630 Anorexia: Secondary | ICD-10-CM | POA: Diagnosis not present

## 2014-06-08 DIAGNOSIS — S065X0A Traumatic subdural hemorrhage without loss of consciousness, initial encounter: Secondary | ICD-10-CM | POA: Diagnosis not present

## 2014-06-08 DIAGNOSIS — H6120 Impacted cerumen, unspecified ear: Secondary | ICD-10-CM | POA: Diagnosis not present

## 2014-06-08 DIAGNOSIS — I25119 Atherosclerotic heart disease of native coronary artery with unspecified angina pectoris: Secondary | ICD-10-CM | POA: Diagnosis not present

## 2014-06-08 DIAGNOSIS — M6281 Muscle weakness (generalized): Secondary | ICD-10-CM | POA: Diagnosis not present

## 2014-06-08 DIAGNOSIS — I10 Essential (primary) hypertension: Secondary | ICD-10-CM

## 2014-06-08 DIAGNOSIS — F338 Other recurrent depressive disorders: Secondary | ICD-10-CM | POA: Diagnosis not present

## 2014-06-08 DIAGNOSIS — R110 Nausea: Secondary | ICD-10-CM | POA: Diagnosis not present

## 2014-06-08 DIAGNOSIS — E785 Hyperlipidemia, unspecified: Secondary | ICD-10-CM | POA: Diagnosis not present

## 2014-06-08 DIAGNOSIS — R296 Repeated falls: Secondary | ICD-10-CM | POA: Diagnosis not present

## 2014-06-08 DIAGNOSIS — I209 Angina pectoris, unspecified: Secondary | ICD-10-CM | POA: Diagnosis not present

## 2014-06-08 DIAGNOSIS — K5900 Constipation, unspecified: Secondary | ICD-10-CM | POA: Diagnosis not present

## 2014-06-08 MED ORDER — OXYCODONE HCL 5 MG PO CAPS
5.0000 mg | ORAL_CAPSULE | ORAL | Status: DC | PRN
Start: 2014-06-08 — End: 2014-11-02

## 2014-06-08 MED ORDER — DSS 100 MG PO CAPS: 100.0000 mg | ORAL_CAPSULE | Freq: Every day | ORAL | Status: DC | PRN

## 2014-06-08 MED ORDER — VALACYCLOVIR HCL 500 MG PO TABS: 500.0000 mg | ORAL_TABLET | Freq: Two times a day (BID) | ORAL | Status: AC | PRN

## 2014-06-08 NOTE — Discharge Summary (Addendum)
Physician Discharge Summary  [name] LJQ:[id number] DOB: 14-Jul-1934 DOA: 06/04/2014  PCP: Carla Morale, MD  Admit date: 06/04/2014 Discharge date: 06/08/2014  Time spent: 35 minutes  Recommendations for Outpatient Follow-up:  1. Watch for signs of fluid accumulation- may need lasix resumed 2. Bmp 1 week re Na  Discharge Diagnoses:  Principal Problem:   SDH (subdural hematoma) Active Problems:   Essential hypertension   Coronary atherosclerosis   Peripheral vascular disease   Memory loss   SAH (subarachnoid hemorrhage)   Diabetes mellitus type 2 in nonobese   Discharge Condition: improved  Diet recommendation: cardiac/diabetic  Filed Weights   06/06/14 2340 06/07/14 0500 06/08/14 0453  Weight: 68.357 kg (150 lb 11.2 oz) 68.357 kg (150 lb 11.2 oz) 70.035 kg (154 lb 6.4 oz)    History of present illness:  Carla Friedman is a 79 y.o. female with Past medical history of coronary artery disease 1 hospitalized for this, but if there was no disease, hypertension, dyslipidemia, diabetes mellitus, peptic ulcer, hypothyroidism, memory issue. The patient is presenting with a fall. The patient lives in the group home and while she was walking there she missed a step and fell down with face first on the right side. She did not passed out did not had any dizziness or lightheadedness no vertigo reported. At the time of my evaluation she did not have any chest pain shortness of breath nausea vomiting abdominal pain focal deficit. Since last one week the daughter has noted the patient has been having progressively worsening confusion and generalized weakness. Since last 2 weeks patient has poor appetite and has not been eating and drinking good now. She started having profuse vomiting after the episode of the fall tonight. She was started on Bactrim since last Friday for UTI which has been making her nauseousness. She has on and off chest pain since long and is been taking  nitroglycerin and ranexa for the same. She mentions that earlier this morning she had very severe chest pain and has taken 2 nitroglycerin back-to-back.  The patient is coming from home. And at her baseline dependent for most of her ADL.  Hospital Course:  SDH (subdural hematoma) -very small, conservative mgt, NSG following -repeat CT scan ok   UTI-partially treated, was on Bactrim at facility -s/p ceftriaxone  -suspect that urine Cx will be negative since this was partially treated  hyponatremia -due to hypovolemia, improving -d/c IVF- hold lasix- watch for signs of fluid overload at facility  coronary artery disease s/p CABG, Peripheral vascular disease -hold ASA/plavix   Dementia -memory worsening per daughter, will likely need higher level of care, currently at [facility] -[facility]  -was on hospice but family plans to revoke  Procedures:  CT scan  Consultations:  NS  Discharge Exam: Filed Vitals:   06/08/14 0903  BP: 122/65  Pulse: 64  Temp: 97.9 F (36.6 C)  Resp: 18    General: pleasant/cooperative Cardiovascular: rrr Respiratory: clear anterior  Discharge Instructions You were cared for by a hospitalist during your hospital stay. If you have any questions about your discharge medications or the care you received while you were in the hospital after you are discharged, you can call the unit and asked to speak with the hospitalist on call if the hospitalist that took care of you is not available. Once you are discharged, your primary care physician will handle any further medical issues. Please note that NO REFILLS for any discharge medications will be authorized once you are  discharged, as it is imperative that you return to your primary care physician (or establish a relationship with a primary care physician if you do not have one) for your aftercare needs so that they can reassess your need for medications and monitor your lab values.  Discharge Instructions    Diet  - low sodium heart healthy    Complete by:  As directed      Diet Carb Modified    Complete by:  As directed      Increase activity slowly    Complete by:  As directed           Current Discharge Medication List    START taking these medications   Details  docusate sodium 100 MG CAPS Take 100 mg by mouth daily as needed for mild constipation. Qty: 10 capsule, Refills: 0      CONTINUE these medications which have CHANGED   Details  oxycodone (OXY-IR) 5 MG capsule Take 1 capsule (5 mg total) by mouth every 4 (four) hours as needed (severe pain). Qty: 15 capsule, Refills: 0    valACYclovir (VALTREX) 500 MG tablet Take 1 tablet (500 mg total) by mouth 2 (two) times daily as needed (fever blisters). Qty: 20 tablet, Refills: 5      CONTINUE these medications which have NOT CHANGED   Details  amitriptyline (ELAVIL) 50 MG tablet Take 50 mg by mouth at bedtime.    Ascorbic Acid (VITAMIN C) 500 MG tablet Take 500 mg by mouth daily.      aspirin 81 MG tablet Take 81 mg by mouth at bedtime.     atenolol (TENORMIN) 50 MG tablet Take 50 mg by mouth every morning.    Bee Pollen 500 MG TABS Take 500 mg by mouth daily.     Calcium Carbonate (CALCIUM 600 PO) Take 600 mg by mouth daily.     Cholecalciferol (VITAMIN D) 1000 UNITS capsule Take 1,000 Units by mouth daily.      gabapentin (NEURONTIN) 100 MG capsule Take 100 mg by mouth 2 (two) times daily.    isosorbide dinitrate (ISORDIL) 30 MG tablet Take 30 mg by mouth 2 (two) times daily.    metFORMIN (GLUCOPHAGE) 1000 MG tablet Take 1,000 mg by mouth daily with breakfast.    Multiple Vitamin (MULTIVITAMIN) capsule Take 1 capsule by mouth daily.      nitroGLYCERIN (NITROSTAT) 0.4 MG SL tablet Place 0.4 mg under the tongue every 5 (five) minutes as needed for chest pain.     pravastatin (PRAVACHOL) 40 MG tablet Take 40 mg by mouth every evening.    promethazine (PHENERGAN) 25 MG tablet Take 25 mg by mouth every 6 (six) hours as  needed for nausea or vomiting.     ramipril (ALTACE) 10 MG capsule Take 10 mg by mouth daily.    ranolazine (RANEXA) 500 MG 12 hr tablet Take 500 mg by mouth 2 (two) times daily.      STOP taking these medications     clopidogrel (PLAVIX) 75 MG tablet      furosemide (LASIX) 20 MG tablet      sulfamethoxazole-trimethoprim (BACTRIM DS,SEPTRA DS) 800-160 MG per tablet      furosemide (LASIX) 20 MG tablet        Allergies  Allergen Reactions  . Anbesol Cold Sore Therapy [Lip Medex]     Caused lips to swell  . Morphine Other (See Comments)    Respiratory distress  . Other Other (See Comments)  Vicryl Stitches  "body rejects them"  . Codeine Other (See Comments)    Respiratory distress      The results of significant diagnostics from this hospitalization (including imaging, microbiology, ancillary and laboratory) are listed below for reference.    Significant Diagnostic Studies: Dg Chest 2 View  06/05/2014   CLINICAL DATA:  Chest pain beginning today which shortness of breath.  EXAM: CHEST  2 VIEW  COMPARISON:  11/01/2009  FINDINGS: Sternotomy wires unchanged. Lungs are adequately inflated without focal consolidation or effusion. Cardiomediastinal silhouette is within normal. Coronary artery stent unchanged. Remainder of the exam is unchanged.  IMPRESSION: No active cardiopulmonary disease.   Electronically Signed   By: Marin Olp M.D.   On: 06/05/2014 01:03   Ct Head Wo Contrast  06/06/2014   CLINICAL DATA:  Follow-up subdural hematoma, increased confusion  EXAM: CT HEAD WITHOUT CONTRAST  TECHNIQUE: Contiguous axial images were obtained from the base of the skull through the vertex without intravenous contrast.  COMPARISON:  06/05/2014  FINDINGS: Again noted right periorbital soft tissue swelling with small subcutaneous supraorbital hematoma measures 1.6 cm length by 7 mm thickness. Tiny amount subdural hemorrhage right frontal region is stable measures about 2.8 mm  thickness. Again noted small amount of subarachnoid hemorrhage in left frontal lobe without significant change. There is no mass effect or midline shift. Ventricular size is stable from prior exam. Stable atrophy and chronic white matter disease. No intraventricular hemorrhage.  IMPRESSION: 1. Again noted soft tissue swelling right periorbital region with small subcutaneous supraorbital hematoma. 2. Stable small right frontal subdural hemorrhage measures 2.8 mm in thickness without significant change. No significant change in small subarachnoid hemorrhage in left frontal lobe. No mass effect or midline shift. No new hemorrhage is noted. No intraventricular hemorrhage. Stable atrophy and chronic white matter disease.   Electronically Signed   By: Lahoma Crocker M.D.   On: 06/06/2014 16:21   Ct Head Wo Contrast  06/05/2014   CLINICAL DATA:  Trip and fall on patio this evening. RIGHT periorbital soft tissue swelling and laceration. On Plavix.  EXAM: CT HEAD WITHOUT CONTRAST  CT ORBITS WITHOUT CONTRAST  CT CERVICAL SPINE WITHOUT CONTRAST  TECHNIQUE: Multidetector CT imaging of the head, cervical spine, and orbital structures were performed using the standard protocol without intravenous contrast. Multiplanar CT image reconstructions of the cervical spine and maxillofacial structures were also generated.  COMPARISON:  CT of the head February 21, 2006  FINDINGS: CT HEAD FINDINGS  The ventricles and sulci are normal for age. No intraparenchymal hemorrhage, mass effect nor midline shift. Patchy supratentorial white matter hypodensities are less than expected for patient's age and though non-specific suggest sequelae of chronic small vessel ischemic disease. No acute large vascular territory infarcts.  3 mm RIGHT frontal subdural hematoma, axial 16/29. Small amount of LEFT frontal subarachnoid blood. Basal cisterns are patent. Moderate calcific atherosclerosis of the carotid siphons. No skull fracture.  CT ORBITS FINDINGS   Orbital walls and rims intact. No acute visualized facial fracture. No destructive bony lesions.  Paranasal sinuses are well aerated. Nasal septum slightly deviates to the RIGHT with bilateral concha bullosa.  Large RIGHT periorbital soft tissue swelling without subcutaneous gas or radiopaque foreign bodies. Ocular globes intact. Status post bilateral ocular lens implants. Preservation the retrobulbar and extra Conal fat. Normal appearance optic nerve sheath complexes. Normal appearance of the extraocular muscles.  CT CERVICAL SPINE FINDINGS  Cervical vertebral bodies and posterior elements are intact. Minimal grade 1 C3-4 retrolisthesis  on degenerative basis. Severe C3-4 through C7-T1 degenerative discs. C1-2 articulation maintained with severe arthropathy. Minimal calcification about the odontoid process may be seen with CPPD. No destructive bony lesions. Moderate calcific atherosclerosis of the carotid arteries.  IMPRESSION: CT HEAD: 3 mm RIGHT frontal subdural hematoma without mass effect. Small amount of LEFT frontal subarachnoid blood.  Involutional changes. Mild white matter changes suggest chronic small vessel ischemic disease.  CT ORBITS: RIGHT periorbital soft tissue swelling without postseptal hematoma. No a acute orbital fracture.  CT CERVICAL SPINE: No acute fracture. Minimal grade 1 C3-4 retrolisthesis on degenerative basis.  Acute findings discussed with and reconfirmed by Dr.ADELEKE ONI on 06/05/2014 at 1:00 am.   Electronically Signed   By: Elon Alas   On: 06/05/2014 01:05   Ct Cervical Spine Wo Contrast  06/05/2014   CLINICAL DATA:  Trip and fall on patio this evening. RIGHT periorbital soft tissue swelling and laceration. On Plavix.  EXAM: CT HEAD WITHOUT CONTRAST  CT ORBITS WITHOUT CONTRAST  CT CERVICAL SPINE WITHOUT CONTRAST  TECHNIQUE: Multidetector CT imaging of the head, cervical spine, and orbital structures were performed using the standard protocol without intravenous contrast.  Multiplanar CT image reconstructions of the cervical spine and maxillofacial structures were also generated.  COMPARISON:  CT of the head February 21, 2006  FINDINGS: CT HEAD FINDINGS  The ventricles and sulci are normal for age. No intraparenchymal hemorrhage, mass effect nor midline shift. Patchy supratentorial white matter hypodensities are less than expected for patient's age and though non-specific suggest sequelae of chronic small vessel ischemic disease. No acute large vascular territory infarcts.  3 mm RIGHT frontal subdural hematoma, axial 16/29. Small amount of LEFT frontal subarachnoid blood. Basal cisterns are patent. Moderate calcific atherosclerosis of the carotid siphons. No skull fracture.  CT ORBITS FINDINGS  Orbital walls and rims intact. No acute visualized facial fracture. No destructive bony lesions.  Paranasal sinuses are well aerated. Nasal septum slightly deviates to the RIGHT with bilateral concha bullosa.  Large RIGHT periorbital soft tissue swelling without subcutaneous gas or radiopaque foreign bodies. Ocular globes intact. Status post bilateral ocular lens implants. Preservation the retrobulbar and extra Conal fat. Normal appearance optic nerve sheath complexes. Normal appearance of the extraocular muscles.  CT CERVICAL SPINE FINDINGS  Cervical vertebral bodies and posterior elements are intact. Minimal grade 1 C3-4 retrolisthesis on degenerative basis. Severe C3-4 through C7-T1 degenerative discs. C1-2 articulation maintained with severe arthropathy. Minimal calcification about the odontoid process may be seen with CPPD. No destructive bony lesions. Moderate calcific atherosclerosis of the carotid arteries.  IMPRESSION: CT HEAD: 3 mm RIGHT frontal subdural hematoma without mass effect. Small amount of LEFT frontal subarachnoid blood.  Involutional changes. Mild white matter changes suggest chronic small vessel ischemic disease.  CT ORBITS: RIGHT periorbital soft tissue swelling without  postseptal hematoma. No a acute orbital fracture.  CT CERVICAL SPINE: No acute fracture. Minimal grade 1 C3-4 retrolisthesis on degenerative basis.  Acute findings discussed with and reconfirmed by Dr.ADELEKE ONI on 06/05/2014 at 1:00 am.   Electronically Signed   By: Elon Alas   On: 06/05/2014 01:05   Ct Orbitss W/o Cm  06/05/2014   CLINICAL DATA:  Trip and fall on patio this evening. RIGHT periorbital soft tissue swelling and laceration. On Plavix.  EXAM: CT HEAD WITHOUT CONTRAST  CT ORBITS WITHOUT CONTRAST  CT CERVICAL SPINE WITHOUT CONTRAST  TECHNIQUE: Multidetector CT imaging of the head, cervical spine, and orbital structures were performed using the standard protocol  without intravenous contrast. Multiplanar CT image reconstructions of the cervical spine and maxillofacial structures were also generated.  COMPARISON:  CT of the head February 21, 2006  FINDINGS: CT HEAD FINDINGS  The ventricles and sulci are normal for age. No intraparenchymal hemorrhage, mass effect nor midline shift. Patchy supratentorial white matter hypodensities are less than expected for patient's age and though non-specific suggest sequelae of chronic small vessel ischemic disease. No acute large vascular territory infarcts.  3 mm RIGHT frontal subdural hematoma, axial 16/29. Small amount of LEFT frontal subarachnoid blood. Basal cisterns are patent. Moderate calcific atherosclerosis of the carotid siphons. No skull fracture.  CT ORBITS FINDINGS  Orbital walls and rims intact. No acute visualized facial fracture. No destructive bony lesions.  Paranasal sinuses are well aerated. Nasal septum slightly deviates to the RIGHT with bilateral concha bullosa.  Large RIGHT periorbital soft tissue swelling without subcutaneous gas or radiopaque foreign bodies. Ocular globes intact. Status post bilateral ocular lens implants. Preservation the retrobulbar and extra Conal fat. Normal appearance optic nerve sheath complexes. Normal appearance  of the extraocular muscles.  CT CERVICAL SPINE FINDINGS  Cervical vertebral bodies and posterior elements are intact. Minimal grade 1 C3-4 retrolisthesis on degenerative basis. Severe C3-4 through C7-T1 degenerative discs. C1-2 articulation maintained with severe arthropathy. Minimal calcification about the odontoid process may be seen with CPPD. No destructive bony lesions. Moderate calcific atherosclerosis of the carotid arteries.  IMPRESSION: CT HEAD: 3 mm RIGHT frontal subdural hematoma without mass effect. Small amount of LEFT frontal subarachnoid blood.  Involutional changes. Mild white matter changes suggest chronic small vessel ischemic disease.  CT ORBITS: RIGHT periorbital soft tissue swelling without postseptal hematoma. No a acute orbital fracture.  CT CERVICAL SPINE: No acute fracture. Minimal grade 1 C3-4 retrolisthesis on degenerative basis.  Acute findings discussed with and reconfirmed by Dr.ADELEKE ONI on 06/05/2014 at 1:00 am.   Electronically Signed   By: Elon Alas   On: 06/05/2014 01:05    Microbiology: Recent Results (from the past 240 hour(s))  Urine culture     Status: None   Collection Time: 06/05/14  1:44 AM  Result Value Ref Range Status   Specimen Description URINE, CLEAN CATCH  Final   Special Requests NONE  Final   Culture  Setup Time   Final    06/05/2014 09:09 Performed at [facility] Performed at [facility]   Final   Culture NO GROWTH Performed at [facility]   Final   Report Status 06/06/2014 FINAL  Final     Labs: Basic Metabolic Panel:  Recent Labs Lab 06/05/14 2327 06/06/14 0449 06/06/14 1120 06/06/14 1714 06/07/14 0620  NA 127* 132* 134* 134* 135*  K 4.2 4.6 4.5 4.3 3.9  CL 92* 96 99 98 101  CO2 23 24 24 23 22   GLUCOSE 125* 131* 124* 152* 133*  BUN 11 10 9 9 8   CREATININE 0.94 0.98 0.99 0.97 0.88  CALCIUM 8.5 8.8 8.8 8.7 8.4   Liver Function Tests:  Recent Labs Lab  06/05/14 0021 06/05/14 0457  AST 15 13  ALT 11 10  ALKPHOS 58 49  BILITOT 0.4 0.4  PROT 6.6 5.7*  ALBUMIN 3.6 3.2*    Recent Labs Lab 06/05/14 0021  LIPASE 14   No results for input(s): AMMONIA in the last 168 hours. CBC:  Recent Labs Lab 06/05/14 0021 06/05/14 0457  WBC 18.5* 14.8*  NEUTROABS 16.8*  --  HGB 11.8* 10.4*  HCT 34.2* 29.2*  MCV 85.5 83.0  PLT 276 278   Cardiac Enzymes:  Recent Labs Lab 06/05/14 0457  TROPONINI <0.30   BNP: BNP (last 3 results) No results for input(s): PROBNP in the last 8760 hours. CBG:  Recent Labs Lab 06/05/14 0509 06/05/14 0642  GLUCAP 173* 148*       Signed:  Eliseo Squires, [place] [employer]  [facility] 06/08/2014, 9:23 AM

## 2014-06-08 NOTE — Telephone Encounter (Signed)
Can we refill all 3 scripts?

## 2014-06-08 NOTE — Progress Notes (Signed)
[facility] note: ° °Chart reviewed, met with Pt who was sitting in her chair. Pt reported that she was feeling "better" Pt stated that she knows that the plan is for her to go to SNF/rehab she stated "I am going to work hard so I can go back home." Pt said that she knows her daughter is working on choosing a facility for her to go to rehab. Sw did call and speak to Pts' dtr Kristie, she stated that the plan will be for Pt to go to [facility] today for SNF/rehab, Kristie is heading to the hospital and will complete [facility] revocation paperwork so that Pt can have access to her Medicare to pay for SNF/rehab bed at [facility].  °Molly Lyle, LCSW  °[phone] °

## 2014-06-09 DIAGNOSIS — I6200 Nontraumatic subdural hemorrhage, unspecified: Secondary | ICD-10-CM | POA: Diagnosis not present

## 2014-06-09 DIAGNOSIS — N390 Urinary tract infection, site not specified: Secondary | ICD-10-CM | POA: Diagnosis not present

## 2014-06-09 DIAGNOSIS — I2510 Atherosclerotic heart disease of native coronary artery without angina pectoris: Secondary | ICD-10-CM | POA: Diagnosis not present

## 2014-06-15 ENCOUNTER — Ambulatory Visit (INDEPENDENT_AMBULATORY_CARE_PROVIDER_SITE_OTHER): Payer: Medicare Other | Admitting: Cardiovascular Disease

## 2014-06-15 ENCOUNTER — Encounter: Payer: Self-pay | Admitting: Cardiovascular Disease

## 2014-06-15 VITALS — BP 150/72 | HR 75 | Ht 64.0 in | Wt 140.0 lb

## 2014-06-15 DIAGNOSIS — I10 Essential (primary) hypertension: Secondary | ICD-10-CM | POA: Diagnosis not present

## 2014-06-15 DIAGNOSIS — I6200 Nontraumatic subdural hemorrhage, unspecified: Secondary | ICD-10-CM

## 2014-06-15 DIAGNOSIS — I2510 Atherosclerotic heart disease of native coronary artery without angina pectoris: Secondary | ICD-10-CM | POA: Diagnosis not present

## 2014-06-15 DIAGNOSIS — E785 Hyperlipidemia, unspecified: Secondary | ICD-10-CM

## 2014-06-15 DIAGNOSIS — S065X9A Traumatic subdural hemorrhage with loss of consciousness of unspecified duration, initial encounter: Secondary | ICD-10-CM

## 2014-06-15 DIAGNOSIS — S065XAA Traumatic subdural hemorrhage with loss of consciousness status unknown, initial encounter: Secondary | ICD-10-CM

## 2014-06-15 NOTE — Progress Notes (Signed)
Patient ID: TIDA SANER, female   DOB: Nov 11, 1933, 80 y.o.   MRN: [id number] Carla Friedman is seen today for CAD, HTN, and elevated lipids. She has had CABG with stents to the native LAD and SVG to OM. She has distal disease and chronic chest pain. She actually is seen by hospice. She prefers not to be seen in hospital alot. She has less chest pain off lisinopril and on Atenolol. . Her last cath was in 11/2006 and she had a non-ischemic myovue in 11/2007. She has morphine and oxygen at home. She has sig. depression. She looked very good today I am not convinced that all of her pains are anginal but she seems satisfied to have oxygen at home.  She also complains of SOB. it does not sound like volume there is no PND, orthopnea, or edema.  She continues to be depressed. She uses oxygen and nitro as a crutch but this has helped her  over the years.  She has significant venous insuf with marked dependant discoloration but no arterial insuf.  Ranexa has helped her quite a bit.   Now at [employer]. Increasingly forgetful  D/c from hospital 06/08/14 after mechanical fall with small subdural, UTI and hyponatremia.  No cardiac issues during hospitalization Suspect she will need assisted living but she is hesitant     ROS: Denies fever, malais, weight loss, blurry vision, decreased visual acuity, cough, sputum, SOB, hemoptysis, pleuritic pain, palpitaitons, heartburn, abdominal pain, melena, lower extremity edema, claudication, or rash.  All other systems reviewed and negative  General: Affect appropriate Frail elderly female  HEENT:  Large echymosis over left face  Neck supple with no adenopathy JVP normal no bruits no thyromegaly Lungs clear with no wheezing and good diaphragmatic motion Heart:  S1/S2 no murmur, no rub, gallop or click PMI normal Abdomen: benighn, BS positve, no tenderness, no AAA no bruit.  No HSM or HJR Distal pulses intact with no bruits No edema Neuro non-focal Skin  warm and dry No muscular weakness   Current Outpatient Prescriptions  Medication Sig Dispense Refill  . amitriptyline (ELAVIL) 50 MG tablet Take 50 mg by mouth at bedtime.    . Ascorbic Acid (VITAMIN C) 500 MG tablet Take 500 mg by mouth daily.      Marland Kitchen aspirin 81 MG tablet Take 81 mg by mouth at bedtime.     Marland Kitchen atenolol (TENORMIN) 50 MG tablet Take 50 mg by mouth every morning.    Raelyn Ensign Pollen 500 MG TABS Take 500 mg by mouth daily.     . Calcium Carbonate (CALCIUM 600 PO) Take 600 mg by mouth daily.     . Cholecalciferol (VITAMIN D) 1000 UNITS capsule Take 1,000 Units by mouth daily.      Marland Kitchen docusate sodium 100 MG CAPS Take 100 mg by mouth daily as needed for mild constipation. 10 capsule 0  . furosemide (LASIX) 20 MG tablet TAKE ONE TABLET BY MOUTH ONE TIME DAILY  30 tablet 11  . gabapentin (NEURONTIN) 100 MG capsule Take 100 mg by mouth 2 (two) times daily.    Marland Kitchen gabapentin (NEURONTIN) 100 MG capsule TAKE ONE CAPSULE BY MOUTH THREE TIMES DAILY  90 capsule 11  . isosorbide dinitrate (ISORDIL) 30 MG tablet Take 30 mg by mouth 2 (two) times daily.    . metFORMIN (GLUCOPHAGE) 1000 MG tablet Take 1,000 mg by mouth daily with breakfast.    . Multiple Vitamin (MULTIVITAMIN) capsule Take 1 capsule by mouth daily.      Marland Kitchen  nitroGLYCERIN (NITROSTAT) 0.4 MG SL tablet Place 0.4 mg under the tongue every 5 (five) minutes as needed for chest pain.     Marland Kitchen oxycodone (OXY-IR) 5 MG capsule Take 1 capsule (5 mg total) by mouth every 4 (four) hours as needed (severe pain). 15 capsule 0  . pravastatin (PRAVACHOL) 40 MG tablet Take 40 mg by mouth every evening.    . promethazine (PHENERGAN) 25 MG tablet Take 25 mg by mouth every 6 (six) hours as needed for nausea or vomiting.     . ramipril (ALTACE) 10 MG capsule Take 10 mg by mouth daily.    . ramipril (ALTACE) 10 MG capsule TAKE ONE CAPSULE BY MOUTH ONE TIME DAILY  30 capsule 11  . ranolazine (RANEXA) 500 MG 12 hr tablet Take 500 mg by mouth 2 (two) times daily.     . valACYclovir (VALTREX) 500 MG tablet Take 1 tablet (500 mg total) by mouth 2 (two) times daily as needed (fever blisters). 20 tablet 5   No current facility-administered medications for this visit.    Allergies  Anbesol cold sore therapy; Morphine; Other; and Codeine  Electrocardiogram:  SR rate 64  LAD no acute ST/ T wave changes 11/15  Assessment and Plan

## 2014-06-15 NOTE — Patient Instructions (Signed)
Your physician recommends that you schedule a follow-up appointment in:  3 MONTHS WITH  DR NISHAN  Your physician recommends that you continue on your current medications as directed. Please refer to the Current Medication list given to you today.  

## 2014-06-15 NOTE — Assessment & Plan Note (Signed)
Still at rehab Stable seen by [facility]  Ambulating with help  Will need assisted living

## 2014-06-15 NOTE — Assessment & Plan Note (Signed)
Cholesterol is at goal.  Continue current dose of statin and diet Rx.  No myalgias or side effects.  F/U  LFT's in 6 months. Lab Results  Component Value Date   [facility]  11/02/2008    53        Total Cholesterol/HDL:CHD Risk Coronary Heart Disease Risk Table                     Men   Women  1/2 Average Risk   3.4   3.3  Average Risk       5.0   4.4  2 X Average Risk   9.6   7.1  3 X Average Risk  23.4   11.0        Use the calculated Patient Ratio above and the CHD Risk Table to determine the patient's CHD Risk.        ATP III CLASSIFICATION (LDL):  <100     mg/dL   Optimal  100-129  mg/dL   Near or Above                    Optimal  130-159  mg/dL   Borderline  160-189  mg/dL   High  >190     mg/dL   Very High

## 2014-06-15 NOTE — Assessment & Plan Note (Signed)
Given frailty , hyponatremia, and dehydration continue to hold diuretic.  She has lost weight and has diarrhea with sweets so is prone To dehydration

## 2014-06-15 NOTE — Assessment & Plan Note (Signed)
Stable with no angina and good activity level.  Continue medical Rx Much improvement with ranexa.  PRN nitro No longer on hospice

## 2014-06-25 DIAGNOSIS — H6120 Impacted cerumen, unspecified ear: Secondary | ICD-10-CM | POA: Diagnosis not present

## 2014-07-04 DIAGNOSIS — I25729 Atherosclerosis of autologous artery coronary artery bypass graft(s) with unspecified angina pectoris: Secondary | ICD-10-CM | POA: Diagnosis not present

## 2014-07-04 DIAGNOSIS — R279 Unspecified lack of coordination: Secondary | ICD-10-CM | POA: Diagnosis not present

## 2014-07-04 DIAGNOSIS — Z7901 Long term (current) use of anticoagulants: Secondary | ICD-10-CM | POA: Diagnosis not present

## 2014-07-04 DIAGNOSIS — R296 Repeated falls: Secondary | ICD-10-CM | POA: Diagnosis not present

## 2014-07-04 DIAGNOSIS — E1140 Type 2 diabetes mellitus with diabetic neuropathy, unspecified: Secondary | ICD-10-CM | POA: Diagnosis not present

## 2014-07-04 DIAGNOSIS — Z87440 Personal history of urinary (tract) infections: Secondary | ICD-10-CM | POA: Diagnosis not present

## 2014-07-04 DIAGNOSIS — I739 Peripheral vascular disease, unspecified: Secondary | ICD-10-CM | POA: Diagnosis not present

## 2014-07-04 DIAGNOSIS — I10 Essential (primary) hypertension: Secondary | ICD-10-CM | POA: Diagnosis not present

## 2014-07-04 DIAGNOSIS — Z951 Presence of aortocoronary bypass graft: Secondary | ICD-10-CM | POA: Diagnosis not present

## 2014-07-04 DIAGNOSIS — G8929 Other chronic pain: Secondary | ICD-10-CM | POA: Diagnosis not present

## 2014-07-04 DIAGNOSIS — I6901 Cognitive deficits following nontraumatic subarachnoid hemorrhage: Secondary | ICD-10-CM | POA: Diagnosis not present

## 2014-07-04 DIAGNOSIS — F339 Major depressive disorder, recurrent, unspecified: Secondary | ICD-10-CM | POA: Diagnosis not present

## 2014-07-04 DIAGNOSIS — S065X0S Traumatic subdural hemorrhage without loss of consciousness, sequela: Secondary | ICD-10-CM | POA: Diagnosis not present

## 2014-07-06 ENCOUNTER — Telehealth: Payer: Self-pay | Admitting: Family Medicine

## 2014-07-06 NOTE — Telephone Encounter (Signed)
Request for Pt, Carla Friedman, also last A1c was 6.1 on 06-05-14. Per Dr. Sarajane Jews, please call and give verbal for this. I did call and give verbal order to [place].

## 2014-07-07 DIAGNOSIS — F339 Major depressive disorder, recurrent, unspecified: Secondary | ICD-10-CM | POA: Diagnosis not present

## 2014-07-07 DIAGNOSIS — S065X0S Traumatic subdural hemorrhage without loss of consciousness, sequela: Secondary | ICD-10-CM | POA: Diagnosis not present

## 2014-07-07 DIAGNOSIS — G8929 Other chronic pain: Secondary | ICD-10-CM | POA: Diagnosis not present

## 2014-07-07 DIAGNOSIS — R296 Repeated falls: Secondary | ICD-10-CM | POA: Diagnosis not present

## 2014-07-07 DIAGNOSIS — I6901 Cognitive deficits following nontraumatic subarachnoid hemorrhage: Secondary | ICD-10-CM | POA: Diagnosis not present

## 2014-07-07 DIAGNOSIS — I25729 Atherosclerosis of autologous artery coronary artery bypass graft(s) with unspecified angina pectoris: Secondary | ICD-10-CM | POA: Diagnosis not present

## 2014-07-09 ENCOUNTER — Other Ambulatory Visit: Payer: Self-pay | Admitting: Cardiovascular Disease

## 2014-07-09 ENCOUNTER — Telehealth: Payer: Self-pay | Admitting: Family Medicine

## 2014-07-09 NOTE — Telephone Encounter (Signed)
FYI

## 2014-07-09 NOTE — Telephone Encounter (Signed)
Carla Friedman w/ [facility] OT therapy was asked to postpone by family her OT truntil next week.  Probably next thurs, but he will call and confirm

## 2014-07-13 ENCOUNTER — Ambulatory Visit (INDEPENDENT_AMBULATORY_CARE_PROVIDER_SITE_OTHER): Payer: Medicare Other | Admitting: Family Medicine

## 2014-07-13 ENCOUNTER — Encounter: Payer: Self-pay | Admitting: Family Medicine

## 2014-07-13 VITALS — BP 175/88 | HR 72 | Temp 98.4°F | Ht 64.0 in | Wt 141.0 lb

## 2014-07-13 DIAGNOSIS — E871 Hypo-osmolality and hyponatremia: Secondary | ICD-10-CM

## 2014-07-13 DIAGNOSIS — E119 Type 2 diabetes mellitus without complications: Secondary | ICD-10-CM | POA: Diagnosis not present

## 2014-07-13 DIAGNOSIS — F32A Depression, unspecified: Secondary | ICD-10-CM

## 2014-07-13 DIAGNOSIS — F329 Major depressive disorder, single episode, unspecified: Secondary | ICD-10-CM | POA: Diagnosis not present

## 2014-07-13 DIAGNOSIS — M5440 Lumbago with sciatica, unspecified side: Secondary | ICD-10-CM

## 2014-07-13 DIAGNOSIS — I2510 Atherosclerotic heart disease of native coronary artery without angina pectoris: Secondary | ICD-10-CM | POA: Diagnosis not present

## 2014-07-13 LAB — BASIC METABOLIC PANEL
BUN: 9 mg/dL (ref 6–23)
CHLORIDE: 100 meq/L (ref 96–112)
CO2: 30 mEq/L (ref 19–32)
Calcium: 9.3 mg/dL (ref 8.4–10.5)
Creatinine, Ser: 0.9 mg/dL (ref 0.4–1.2)
GFR: 66.92 mL/min (ref >60.00)
Glucose, Bld: 78 mg/dL (ref 70–99)
POTASSIUM: 4.1 meq/L (ref 3.5–5.1)
SODIUM: 138 meq/L (ref 135–145)

## 2014-07-14 ENCOUNTER — Encounter: Payer: Self-pay | Admitting: Family Medicine

## 2014-07-14 DIAGNOSIS — I25729 Atherosclerosis of autologous artery coronary artery bypass graft(s) with unspecified angina pectoris: Secondary | ICD-10-CM | POA: Diagnosis not present

## 2014-07-14 DIAGNOSIS — G8929 Other chronic pain: Secondary | ICD-10-CM | POA: Diagnosis not present

## 2014-07-14 DIAGNOSIS — S065X0S Traumatic subdural hemorrhage without loss of consciousness, sequela: Secondary | ICD-10-CM | POA: Diagnosis not present

## 2014-07-14 DIAGNOSIS — F339 Major depressive disorder, recurrent, unspecified: Secondary | ICD-10-CM | POA: Diagnosis not present

## 2014-07-14 DIAGNOSIS — R296 Repeated falls: Secondary | ICD-10-CM | POA: Diagnosis not present

## 2014-07-14 DIAGNOSIS — I6901 Cognitive deficits following nontraumatic subarachnoid hemorrhage: Secondary | ICD-10-CM | POA: Diagnosis not present

## 2014-07-14 NOTE — Progress Notes (Signed)
   Subjective:    Patient ID: Carla Friedman, female    DOB: 10/19/1933, 80 y.o.   MRN: [id number]  HPI Here with her daughter Drue Dun to follow up a hospital stay from 06-04-14 to 06-08-14 and then a rehab stay at [facility] until she went home on 07-02-14. Prior to the first admission she had been having periods of confusion and difficulty walking at home, and she fell on the day of admission. She sustained severe bruising to the right side of her face with her eye swelled shut, and he right arm was severely bruised. Amazingly there were no fractures and no significant internal head injuries. She was found to be very hyponatremic with a sodium of 125, abd she had a UTI. She was treated with IV fluids and antibiotics, and these resolved. Her last sodium level at [facility] was 137. To enable her to go to the rehab facility, she was taken out of the Hospice program, and now she is by herself back at [facility]. She feels much better and is walking with her walker, however her daughter is very concerned about her being by herself and about the ongoing risk of falls. They wonder if she should be re-enrolled in the Hospice program.    Review of Systems  Constitutional: Negative.   Respiratory: Negative.   Cardiovascular: Negative.   Genitourinary: Negative.   Neurological: Negative.   Psychiatric/Behavioral: Negative.        Objective:   Physical Exam  Constitutional: She is oriented to person, place, and time. She appears well-developed and well-nourished.  She is alert and walks well with her rolling walker  HENT:  Head: Normocephalic and atraumatic.  The ecchymosis and swelling around the right orbit has resolved.   Eyes: Conjunctivae and EOM are normal. Pupils are equal, round, and reactive to light.  Neck: Neck supple. No thyromegaly present.  Cardiovascular: Normal rate, regular rhythm, normal heart sounds and intact distal pulses.   Pulmonary/Chest: Effort normal and breath  sounds normal.  Lymphadenopathy:    She has no cervical adenopathy.  Neurological: She is alert and oriented to person, place, and time. No cranial nerve deficit. She exhibits normal muscle tone. Coordination normal.  Psychiatric: She has a normal mood and affect. Her behavior is normal. Thought content normal.          Assessment & Plan:  She seems to have recovered from her fall well. We will check another BMET today. We all agreed that the best course of action now is to get her re-enrolled in Hospice, so we will arrange this.

## 2014-07-15 ENCOUNTER — Telehealth: Payer: Self-pay | Admitting: Family Medicine

## 2014-07-15 DIAGNOSIS — E871 Hypo-osmolality and hyponatremia: Secondary | ICD-10-CM | POA: Insufficient documentation

## 2014-07-15 NOTE — Telephone Encounter (Signed)
Per Dr. Sarajane Jews call [facility] and request to re enroll pt. She was recently in the hospital and then went to rehabilitation. I called and did give a verbal order for this request. Hospice will be contacting the pt and family to re enroll.

## 2014-07-16 ENCOUNTER — Telehealth: Payer: Self-pay | Admitting: Family Medicine

## 2014-07-16 NOTE — Telephone Encounter (Signed)
Dr. Cherie Ouch from Hospice called to speak with Dr. Sarajane Jews, nothing urgent. Can you give him a call back at [phone]? I did let him know that the doctor would not be returning until Tuesday 07/18/14.

## 2014-07-21 NOTE — Telephone Encounter (Signed)
We did try to call the below number and it is not the correct number. Dr. Sarajane Jews is aware of this.

## 2014-07-28 DIAGNOSIS — G8929 Other chronic pain: Secondary | ICD-10-CM | POA: Diagnosis not present

## 2014-07-28 DIAGNOSIS — I6901 Cognitive deficits following nontraumatic subarachnoid hemorrhage: Secondary | ICD-10-CM | POA: Diagnosis not present

## 2014-07-28 DIAGNOSIS — I25729 Atherosclerosis of autologous artery coronary artery bypass graft(s) with unspecified angina pectoris: Secondary | ICD-10-CM | POA: Diagnosis not present

## 2014-07-28 DIAGNOSIS — S065X0S Traumatic subdural hemorrhage without loss of consciousness, sequela: Secondary | ICD-10-CM | POA: Diagnosis not present

## 2014-07-28 DIAGNOSIS — F339 Major depressive disorder, recurrent, unspecified: Secondary | ICD-10-CM | POA: Diagnosis not present

## 2014-07-28 DIAGNOSIS — R296 Repeated falls: Secondary | ICD-10-CM | POA: Diagnosis not present

## 2014-07-31 DIAGNOSIS — G8929 Other chronic pain: Secondary | ICD-10-CM | POA: Diagnosis not present

## 2014-07-31 DIAGNOSIS — I25729 Atherosclerosis of autologous artery coronary artery bypass graft(s) with unspecified angina pectoris: Secondary | ICD-10-CM | POA: Diagnosis not present

## 2014-07-31 DIAGNOSIS — I6901 Cognitive deficits following nontraumatic subarachnoid hemorrhage: Secondary | ICD-10-CM | POA: Diagnosis not present

## 2014-07-31 DIAGNOSIS — F339 Major depressive disorder, recurrent, unspecified: Secondary | ICD-10-CM | POA: Diagnosis not present

## 2014-07-31 DIAGNOSIS — R296 Repeated falls: Secondary | ICD-10-CM | POA: Diagnosis not present

## 2014-07-31 DIAGNOSIS — S065X0S Traumatic subdural hemorrhage without loss of consciousness, sequela: Secondary | ICD-10-CM | POA: Diagnosis not present

## 2014-08-03 DIAGNOSIS — R296 Repeated falls: Secondary | ICD-10-CM | POA: Diagnosis not present

## 2014-08-03 DIAGNOSIS — I25729 Atherosclerosis of autologous artery coronary artery bypass graft(s) with unspecified angina pectoris: Secondary | ICD-10-CM | POA: Diagnosis not present

## 2014-08-03 DIAGNOSIS — S065X0S Traumatic subdural hemorrhage without loss of consciousness, sequela: Secondary | ICD-10-CM | POA: Diagnosis not present

## 2014-08-03 DIAGNOSIS — I6901 Cognitive deficits following nontraumatic subarachnoid hemorrhage: Secondary | ICD-10-CM | POA: Diagnosis not present

## 2014-08-03 DIAGNOSIS — F339 Major depressive disorder, recurrent, unspecified: Secondary | ICD-10-CM | POA: Diagnosis not present

## 2014-08-03 DIAGNOSIS — G8929 Other chronic pain: Secondary | ICD-10-CM | POA: Diagnosis not present

## 2014-08-05 DIAGNOSIS — S065X0S Traumatic subdural hemorrhage without loss of consciousness, sequela: Secondary | ICD-10-CM | POA: Diagnosis not present

## 2014-08-05 DIAGNOSIS — I6901 Cognitive deficits following nontraumatic subarachnoid hemorrhage: Secondary | ICD-10-CM | POA: Diagnosis not present

## 2014-08-05 DIAGNOSIS — F339 Major depressive disorder, recurrent, unspecified: Secondary | ICD-10-CM | POA: Diagnosis not present

## 2014-08-06 DIAGNOSIS — F339 Major depressive disorder, recurrent, unspecified: Secondary | ICD-10-CM | POA: Diagnosis not present

## 2014-08-06 DIAGNOSIS — R296 Repeated falls: Secondary | ICD-10-CM | POA: Diagnosis not present

## 2014-08-06 DIAGNOSIS — I6901 Cognitive deficits following nontraumatic subarachnoid hemorrhage: Secondary | ICD-10-CM | POA: Diagnosis not present

## 2014-08-06 DIAGNOSIS — I25729 Atherosclerosis of autologous artery coronary artery bypass graft(s) with unspecified angina pectoris: Secondary | ICD-10-CM | POA: Diagnosis not present

## 2014-08-06 DIAGNOSIS — G8929 Other chronic pain: Secondary | ICD-10-CM | POA: Diagnosis not present

## 2014-08-06 DIAGNOSIS — S065X0S Traumatic subdural hemorrhage without loss of consciousness, sequela: Secondary | ICD-10-CM | POA: Diagnosis not present

## 2014-08-10 DIAGNOSIS — I25729 Atherosclerosis of autologous artery coronary artery bypass graft(s) with unspecified angina pectoris: Secondary | ICD-10-CM | POA: Diagnosis not present

## 2014-08-10 DIAGNOSIS — F339 Major depressive disorder, recurrent, unspecified: Secondary | ICD-10-CM | POA: Diagnosis not present

## 2014-08-10 DIAGNOSIS — S065X0S Traumatic subdural hemorrhage without loss of consciousness, sequela: Secondary | ICD-10-CM | POA: Diagnosis not present

## 2014-08-10 DIAGNOSIS — G8929 Other chronic pain: Secondary | ICD-10-CM | POA: Diagnosis not present

## 2014-08-10 DIAGNOSIS — I6901 Cognitive deficits following nontraumatic subarachnoid hemorrhage: Secondary | ICD-10-CM | POA: Diagnosis not present

## 2014-08-10 DIAGNOSIS — R296 Repeated falls: Secondary | ICD-10-CM | POA: Diagnosis not present

## 2014-08-13 ENCOUNTER — Encounter (HOSPITAL_COMMUNITY): Payer: Self-pay

## 2014-08-13 ENCOUNTER — Emergency Department (HOSPITAL_COMMUNITY)
Admission: EM | Admit: 2014-08-13 | Discharge: 2014-08-13 | Disposition: A | Discharge status: Home / Self Care | Payer: Medicare Other | Attending: Emergency Medicine | Admitting: Emergency Medicine

## 2014-08-13 ENCOUNTER — Telehealth: Payer: Self-pay | Admitting: Family Medicine

## 2014-08-13 DIAGNOSIS — Z9089 Acquired absence of other organs: Secondary | ICD-10-CM | POA: Diagnosis not present

## 2014-08-13 DIAGNOSIS — I2510 Atherosclerotic heart disease of native coronary artery without angina pectoris: Secondary | ICD-10-CM | POA: Insufficient documentation

## 2014-08-13 DIAGNOSIS — I10 Essential (primary) hypertension: Secondary | ICD-10-CM | POA: Insufficient documentation

## 2014-08-13 DIAGNOSIS — R8299 Other abnormal findings in urine: Secondary | ICD-10-CM | POA: Diagnosis present

## 2014-08-13 DIAGNOSIS — Z87891 Personal history of nicotine dependence: Secondary | ICD-10-CM | POA: Insufficient documentation

## 2014-08-13 DIAGNOSIS — N390 Urinary tract infection, site not specified: Secondary | ICD-10-CM | POA: Diagnosis not present

## 2014-08-13 DIAGNOSIS — Z951 Presence of aortocoronary bypass graft: Secondary | ICD-10-CM | POA: Diagnosis not present

## 2014-08-13 DIAGNOSIS — Z9889 Other specified postprocedural states: Secondary | ICD-10-CM | POA: Diagnosis not present

## 2014-08-13 DIAGNOSIS — Z85828 Personal history of other malignant neoplasm of skin: Secondary | ICD-10-CM | POA: Insufficient documentation

## 2014-08-13 DIAGNOSIS — Z79899 Other long term (current) drug therapy: Secondary | ICD-10-CM | POA: Diagnosis not present

## 2014-08-13 DIAGNOSIS — E1140 Type 2 diabetes mellitus with diabetic neuropathy, unspecified: Secondary | ICD-10-CM | POA: Diagnosis not present

## 2014-08-13 DIAGNOSIS — F329 Major depressive disorder, single episode, unspecified: Secondary | ICD-10-CM | POA: Diagnosis not present

## 2014-08-13 DIAGNOSIS — B9689 Other specified bacterial agents as the cause of diseases classified elsewhere: Secondary | ICD-10-CM | POA: Diagnosis not present

## 2014-08-13 LAB — BASIC METABOLIC PANEL
Anion gap: 7 (ref 5–15)
BUN: 8 mg/dL (ref 6–23)
CALCIUM: 8.7 mg/dL (ref 8.4–10.5)
CO2: 29 mmol/L (ref 19–32)
CREATININE: 0.98 mg/dL (ref 0.50–1.10)
Chloride: 93 mEq/L — ABNORMAL LOW (ref 96–112)
GFR calc Af Amer: 62 mL/min — ABNORMAL LOW (ref >90)
GFR calc non Af Amer: 54 mL/min — ABNORMAL LOW (ref >90)
Glucose, Bld: 112 mg/dL — ABNORMAL HIGH (ref 70–99)
POTASSIUM: 3.5 mmol/L (ref 3.5–5.1)
SODIUM: 129 mmol/L — AB (ref 135–145)

## 2014-08-13 LAB — CBC WITH DIFFERENTIAL/PLATELET
BASOS ABS: 0 10*3/uL (ref 0.0–0.1)
Basophils Relative: 0 % (ref 0–1)
EOS PCT: 2 % (ref 0–5)
Eosinophils Absolute: 0.1 10*3/uL (ref 0.0–0.7)
HEMATOCRIT: 32.8 % — AB (ref 36.0–46.0)
Hemoglobin: 11 g/dL — ABNORMAL LOW (ref 12.0–15.0)
Lymphocytes Relative: 29 % (ref 12–46)
Lymphs Abs: 1.2 10*3/uL (ref 0.7–4.0)
MCH: 29.5 pg (ref 26.0–34.0)
MCHC: 33.5 g/dL (ref 30.0–36.0)
MCV: 87.9 fL (ref 78.0–100.0)
MONOS PCT: 11 % (ref 3–12)
Monocytes Absolute: 0.5 10*3/uL (ref 0.1–1.0)
Neutro Abs: 2.3 10*3/uL (ref 1.7–7.7)
Neutrophils Relative %: 58 % (ref 43–77)
PLATELETS: 164 10*3/uL (ref 150–400)
RBC: 3.73 MIL/uL — ABNORMAL LOW (ref 3.87–5.11)
RDW: 13.7 % (ref 11.5–15.5)
WBC: 4 10*3/uL (ref 4.0–10.5)

## 2014-08-13 LAB — URINE MICROSCOPIC-ADD ON

## 2014-08-13 LAB — URINALYSIS, ROUTINE W REFLEX MICROSCOPIC
Glucose, UA: NEGATIVE mg/dL
Hgb urine dipstick: NEGATIVE
Ketones, ur: NEGATIVE mg/dL
Nitrite: NEGATIVE
Protein, ur: NEGATIVE mg/dL
Specific Gravity, Urine: 1.016 (ref 1.005–1.030)
Urobilinogen, UA: 0.2 mg/dL (ref 0.0–1.0)
pH: 5.5 (ref 5.0–8.0)

## 2014-08-13 LAB — CBG MONITORING, ED: Glucose-Capillary: 153 mg/dL — ABNORMAL HIGH (ref 70–99)

## 2014-08-13 MED ORDER — SODIUM CHLORIDE 0.9 % IV SOLN
INTRAVENOUS | Status: DC
  Administered 2014-08-13: 19:00:00 via INTRAVENOUS

## 2014-08-13 MED ORDER — CIPROFLOXACIN HCL 500 MG PO TABS: 500.0000 mg | ORAL_TABLET | Freq: Two times a day (BID) | ORAL | Status: DC

## 2014-08-13 MED ORDER — DEXTROSE 5 % IV SOLN
1.0000 g | INTRAVENOUS | Status: DC
  Administered 2014-08-13: 1 g via INTRAVENOUS
  Filled 2014-08-13: qty 10

## 2014-08-13 NOTE — ED Notes (Signed)
This RN spoke with [facility] concerning the pt's medications she took today. Poison center advised to obtain an EKG and monitor. Pt's daughter originally called the nurse advice line in [place] that in turn reached out to the [facility]. The [facility] is "offline until 4pm".

## 2014-08-13 NOTE — ED Notes (Signed)
Pt reports no complaints but daughter states pt complained to her about rt hip hurting and her whole body hurting. Pt does not appear to be in any distress.

## 2014-08-13 NOTE — Telephone Encounter (Signed)
I sent script e-scribe and spoke with Drue Dun.

## 2014-08-13 NOTE — Discharge Instructions (Signed)
Take your ciprofloxacin as directed and return here if you have a fever or fatigue of the problems. Urinary Tract Infection Urinary tract infections (UTIs) can develop anywhere along your urinary tract. Your urinary tract is your body's drainage system for removing wastes and extra water. Your urinary tract includes two kidneys, two ureters, a bladder, and a urethra. Your kidneys are a pair of bean-shaped organs. Each kidney is about the size of your fist. They are located below your ribs, one on each side of your spine. CAUSES Infections are caused by microbes, which are microscopic organisms, including fungi, viruses, and bacteria. These organisms are so small that they can only be seen through a microscope. Bacteria are the microbes that most commonly cause UTIs. SYMPTOMS  Symptoms of UTIs may vary by age and gender of the patient and by the location of the infection. Symptoms in young women typically include a frequent and intense urge to urinate and a painful, burning feeling in the bladder or urethra during urination. Older women and men are more likely to be tired, shaky, and weak and have muscle aches and abdominal pain. A fever may mean the infection is in your kidneys. Other symptoms of a kidney infection include pain in your back or sides below the ribs, nausea, and vomiting. DIAGNOSIS To diagnose a UTI, your caregiver will ask you about your symptoms. Your caregiver also will ask to provide a urine sample. The urine sample will be tested for bacteria and white blood cells. White blood cells are made by your body to help fight infection. TREATMENT  Typically, UTIs can be treated with medication. Because most UTIs are caused by a bacterial infection, they usually can be treated with the use of antibiotics. The choice of antibiotic and length of treatment depend on your symptoms and the type of bacteria causing your infection. HOME CARE INSTRUCTIONS  If you were prescribed antibiotics, take  them exactly as your caregiver instructs you. Finish the medication even if you feel better after you have only taken some of the medication.  Drink enough water and fluids to keep your urine clear or pale yellow.  Avoid caffeine, tea, and carbonated beverages. They tend to irritate your bladder.  Empty your bladder often. Avoid holding urine for long periods of time.  Empty your bladder before and after sexual intercourse.  After a bowel movement, women should cleanse from front to back. Use each tissue only once. SEEK MEDICAL CARE IF:   You have back pain.  You develop a fever.  Your symptoms do not begin to resolve within 3 days. SEEK IMMEDIATE MEDICAL CARE IF:   You have severe back pain or lower abdominal pain.  You develop chills.  You have nausea or vomiting.  You have continued burning or discomfort with urination. MAKE SURE YOU:   Understand these instructions.  Will watch your condition.  Will get help right away if you are not doing well or get worse. Document Released: 04/19/2005 Document Revised: 01/09/2012 Document Reviewed: 08/18/2011 [facility] Patient Information 2015 [place], [place]. This information is not intended to replace advice given to you by your health care provider. Make sure you discuss any questions you have with your health care provider.

## 2014-08-13 NOTE — ED Provider Notes (Signed)
CSN: [id number]     Arrival date & time 08/13/14  1501 History   First MD Initiated Contact with Patient 08/13/14 1527     Chief Complaint  Patient presents with  . Drug Overdose     (Consider location/radiation/quality/duration/timing/severity/associated sxs/prior Treatment) HPI Comments: Patient here after taking extra doses of her medications by mistake. She took an extra dose of her atenolol, gabapentin, isosorbide, metformin, Altase approximately 3 hours prior to arrival. She has been asymptomatic. Denies any dizziness or lightheadedness. No chest pain or shortness of breath. No abdominal pain. No syncope or near-syncope. Did have a fever yesterday as well as some foul-smelling urine and has been prescribed ciprofloxacin for which she has not taken yet. Called her physician and told to come here.  Patient is a 81 y.o. female presenting with Overdose. The history is provided by the patient.  Drug Overdose    Past Medical History  Diagnosis Date  . Coronary artery disease     sees Dr. Johnsie Cancel, in Hospice for this   . Chest pain, atypical   . PVD (peripheral vascular disease)   . Hypertension   . Dyslipidemia   . Diabetes mellitus type II   . Peptic ulcer disease   . Depression   . Diabetic neuropathy   . Hypothyroidism   . Back pain     low  . Cancer, skin, squamous cell     sees Dr. Lois Huxley at [facility] dermatology  . Confusion    Past Surgical History  Procedure Laterality Date  . Appendectomy    . Tonsillectomy    . Coronary artery bypass graft      stent placement 2001  . Cataract extraction    . Colonoscopy  02-20-08    per Dr. Olevia Perches, diverticulosis, no polyps, repeat in 10 yrs    Family History  Problem Relation Age of Onset  . Emphysema Mother   . Heart attack Father   . Diabetes Father   . Breast cancer Maternal Grandmother   . Diabetes Paternal Grandfather   . Hypertension      family history   History  Substance Use Topics  . Smoking  status: Former Research scientist (life sciences)  . Smokeless tobacco: Never Used  . Alcohol Use: No   OB History    No data available     Review of Systems  All other systems reviewed and are negative.     Allergies  Anbesol cold sore therapy; Morphine; Other; and Codeine  Home Medications   Prior to Admission medications   Medication Sig Start Date End Date Taking? Authorizing Provider  amitriptyline (ELAVIL) 50 MG tablet Take 50 mg by mouth at bedtime.   Yes Historical Provider, MD  Ascorbic Acid (VITAMIN C) 500 MG tablet Take 500 mg by mouth daily.     Yes Historical Provider, MD  aspirin 81 MG tablet Take 81 mg by mouth at bedtime.    Yes Historical Provider, MD  atenolol (TENORMIN) 50 MG tablet Take 50 mg by mouth every morning.   Yes Historical Provider, MD  Bee Pollen 500 MG TABS Take 500 mg by mouth daily.    Yes Historical Provider, MD  Calcium Carbonate (CALCIUM 600 PO) Take 600 mg by mouth daily.    Yes Historical Provider, MD  Cholecalciferol (VITAMIN D) 1000 UNITS capsule Take 1,000 Units by mouth daily.     Yes Historical Provider, MD  clopidogrel (PLAVIX) 75 MG tablet Take 75 mg by mouth daily.   Yes Historical  Provider, MD  docusate sodium 100 MG CAPS Take 100 mg by mouth daily as needed for mild constipation. 06/08/14  Yes Geradine Girt, DO  gabapentin (NEURONTIN) 100 MG capsule TAKE ONE CAPSULE BY MOUTH THREE TIMES DAILY  06/09/14  Yes Laurey Morale, MD  isosorbide dinitrate (ISORDIL) 30 MG tablet Take 30 mg by mouth 2 (two) times daily.   Yes Historical Provider, MD  metFORMIN (GLUCOPHAGE) 1000 MG tablet Take 1,000 mg by mouth daily with breakfast.   Yes Historical Provider, MD  Multiple Vitamin (MULTIVITAMIN) capsule Take 1 capsule by mouth daily.     Yes Historical Provider, MD  nitroGLYCERIN (NITROSTAT) 0.4 MG SL tablet Place 0.4 mg under the tongue every 5 (five) minutes as needed for chest pain.    Yes Historical Provider, MD  oxycodone (OXY-IR) 5 MG capsule Take 1 capsule (5 mg  total) by mouth every 4 (four) hours as needed (severe pain). 06/08/14  Yes Geradine Girt, DO  pravastatin (PRAVACHOL) 40 MG tablet Take 40 mg by mouth every evening.   Yes Historical Provider, MD  promethazine (PHENERGAN) 25 MG tablet Take 25 mg by mouth every 6 (six) hours as needed for nausea or vomiting.    Yes Historical Provider, MD  ramipril (ALTACE) 10 MG capsule TAKE ONE CAPSULE BY MOUTH ONE TIME DAILY  06/09/14  Yes Laurey Morale, MD  RANEXA 500 MG 12 hr tablet TAKE ONE TABLET BY MOUTH TWICE DAILY  07/10/14  Yes Josue Hector, MD  ciprofloxacin (CIPRO) 500 MG tablet Take 1 tablet (500 mg total) by mouth 2 (two) times daily. Patient not taking: Reported on 08/13/2014 08/13/14   Laurey Morale, MD  valACYclovir (VALTREX) 500 MG tablet Take 1 tablet (500 mg total) by mouth 2 (two) times daily as needed (fever blisters). Patient not taking: Reported on 07/13/2014 06/08/14   Jessica U Vann, DO   BP 135/87 mmHg  Pulse 73  Temp(Src) 97.4 F (36.3 C) (Oral)  Resp 16  SpO2 100% Physical Exam  Constitutional: She is oriented to person, place, and time. She appears well-developed and well-nourished.  Non-toxic appearance. No distress.  HENT:  Head: Normocephalic and atraumatic.  Eyes: Conjunctivae, EOM and lids are normal. Pupils are equal, round, and reactive to light.  Neck: Normal range of motion. Neck supple. No tracheal deviation present. No thyroid mass present.  Cardiovascular: Normal rate, regular rhythm and normal heart sounds.  Exam reveals no gallop.   No murmur heard. Pulmonary/Chest: Effort normal and breath sounds normal. No stridor. No respiratory distress. She has no decreased breath sounds. She has no wheezes. She has no rhonchi. She has no rales.  Abdominal: Soft. Normal appearance and bowel sounds are normal. She exhibits no distension. There is no tenderness. There is no rebound and no CVA tenderness.  Musculoskeletal: Normal range of motion. She exhibits no edema or  tenderness.  Neurological: She is alert and oriented to person, place, and time. She has normal strength. No cranial nerve deficit or sensory deficit. GCS eye subscore is 4. GCS verbal subscore is 5. GCS motor subscore is 6.  Skin: Skin is warm and dry. No abrasion and no rash noted.  Psychiatric: She has a normal mood and affect. Her speech is normal and behavior is normal.  Nursing note and vitals reviewed.   ED Course  Procedures (including critical care time) Labs Review Labs Reviewed  CBG MONITORING, ED - Abnormal; Notable for the following:    Glucose-Capillary 153 (*)  All other components within normal limits  URINE CULTURE  URINALYSIS, ROUTINE W REFLEX MICROSCOPIC    Imaging Review No results found.   EKG Interpretation None      MDM   Final diagnoses:  None    Patient will be treated for her urinary tract infection and return precautions given. Blood pressure has been stable here. Patient instructed to follow-up about her mild hypokalemia    Leota Jacobsen, MD 08/13/14 2049

## 2014-08-13 NOTE — ED Notes (Signed)
Pt from home, reports taking daily medications twice today (once at 10:30 and 1pm). Medications include Atenolol, Plavix , Metformin and Renexa. Pt denies any complaint. AO x4. VSS.

## 2014-08-13 NOTE — Telephone Encounter (Signed)
Call in Cipro 500 mg bid for 7 days  

## 2014-08-13 NOTE — Telephone Encounter (Signed)
Pt daughter called to say pt has had a high fever and she managed to get it down. She suspect her  Mom has a UTI based on her cloudy urine and smell. She said she is asking if Dr Sarajane Jews will call in a antibiotic.She said she does want to bring her out or take her to the ER. She said she would like to keep her comfortable     Pharmacy; [facility]

## 2014-08-13 NOTE — Telephone Encounter (Signed)
[facility] [place] Day - Client [place] [facility] Patient Name: [name] DOB: 1934/07/14 Initial Comment Caller states mother took too much medication. Nurse Assessment Nurse: Donalynn Furlong, RN, Myna Hidalgo Date/Time Eilene Ghazi Time): 08/13/2014 1:55:28 PM Confirm and document reason for call. If symptomatic, describe symptoms. ---Caller states mother took too much medication. Dtr states mother "double-took all of her medicine, and stays at an independent adult living center," Pt currently by herself at the house. Dtr is 5 minutes away and going to her now. States mother is currently ok, but very sick with high fever and cold S/S. Daughter is upset and very tearful. Has the patient traveled out of the country within the last 30 days? ---Not Applicable Does the patient require triage? ---Yes Related visit to physician within the last 2 weeks? ---N/A Does the PT have any chronic conditions? (i.e. diabetes, asthma, etc.) ---Unknown Guidelines Guideline Title Affirmed Question Affirmed Notes Medication Question Call [1] DOUBLE DOSE (an extra dose or lesser amount) of prescription drug AND [2] NO symptoms (Exception: a double dose of antibiotics) Final Disposition User Call [place], RN, Myna Hidalgo Comments TC to Poison Control re: double-dosing on Plavix, Ranexa, and Ramipril. Spoke to [facility]". Recommending an EKG in the ER, as well as the MD evaluation now. She will refer this over to poison control in [place]. TC back to daughter to update. She is with mother /pt now, who sounds relatively stable, and they will be leaving for the ER now. Daughter also aware that poison control will be calling her with further questioning * although triage calls for "call PCP", given mothers debilitation with high fever and "flu-like symptoms", as well as double - dosing on cardiac drugs as well as blood thinner, in addition  to recommendation of poison control, advising emergency room at this

## 2014-08-14 LAB — URINE CULTURE
Colony Count: NO GROWTH
Culture: NO GROWTH

## 2014-08-17 ENCOUNTER — Telehealth: Payer: Self-pay | Admitting: Family Medicine

## 2014-08-17 NOTE — Telephone Encounter (Signed)
Pt daughter called to say that she would like to speak with Dr Sarajane Jews concerning her Mom ER visit 08/13/14 .

## 2014-08-18 DIAGNOSIS — R296 Repeated falls: Secondary | ICD-10-CM | POA: Diagnosis not present

## 2014-08-18 DIAGNOSIS — G8929 Other chronic pain: Secondary | ICD-10-CM | POA: Diagnosis not present

## 2014-08-18 DIAGNOSIS — F339 Major depressive disorder, recurrent, unspecified: Secondary | ICD-10-CM | POA: Diagnosis not present

## 2014-08-18 DIAGNOSIS — S065X0S Traumatic subdural hemorrhage without loss of consciousness, sequela: Secondary | ICD-10-CM | POA: Diagnosis not present

## 2014-08-18 DIAGNOSIS — I6901 Cognitive deficits following nontraumatic subarachnoid hemorrhage: Secondary | ICD-10-CM | POA: Diagnosis not present

## 2014-08-18 DIAGNOSIS — I25729 Atherosclerosis of autologous artery coronary artery bypass graft(s) with unspecified angina pectoris: Secondary | ICD-10-CM | POA: Diagnosis not present

## 2014-08-18 NOTE — Telephone Encounter (Signed)
Carla Friedman w/ [facility] home care would like to extend physical therapy for 2 weeks for pt due to the bladder inf and the current health issues pt is having. Verbal ok.

## 2014-08-18 NOTE — Telephone Encounter (Signed)
I spoke with daughter and pt is going to schedule a ER follow up. Okay per Dr. Sarajane Jews for pt to pick up urine specimen cup and I did put this up front.

## 2014-08-18 NOTE — Telephone Encounter (Signed)
I spoke with Carla Friedman form home care and gave the verbal okay for PT, per Dr. Sarajane Jews.

## 2014-08-19 ENCOUNTER — Ambulatory Visit (INDEPENDENT_AMBULATORY_CARE_PROVIDER_SITE_OTHER): Payer: Medicare Other | Admitting: Family Medicine

## 2014-08-19 ENCOUNTER — Encounter: Payer: Self-pay | Admitting: Family Medicine

## 2014-08-19 VITALS — BP 147/75 | HR 71 | Temp 98.2°F | Ht 64.0 in | Wt 137.0 lb

## 2014-08-19 DIAGNOSIS — R1013 Epigastric pain: Secondary | ICD-10-CM | POA: Diagnosis not present

## 2014-08-19 DIAGNOSIS — I25118 Atherosclerotic heart disease of native coronary artery with other forms of angina pectoris: Secondary | ICD-10-CM

## 2014-08-19 DIAGNOSIS — I10 Essential (primary) hypertension: Secondary | ICD-10-CM

## 2014-08-19 DIAGNOSIS — R413 Other amnesia: Secondary | ICD-10-CM

## 2014-08-19 LAB — HEPATIC FUNCTION PANEL
ALBUMIN: 3.7 g/dL (ref 3.5–5.2)
ALT: 17 U/L (ref 0–35)
AST: 23 U/L (ref 0–37)
Alkaline Phosphatase: 46 U/L (ref 39–117)
Bilirubin, Direct: 0.1 mg/dL (ref 0.0–0.3)
Total Bilirubin: 0.4 mg/dL (ref 0.2–1.2)
Total Protein: 6.5 g/dL (ref 6.0–8.3)

## 2014-08-19 LAB — BASIC METABOLIC PANEL
BUN: 9 mg/dL (ref 6–23)
CO2: 28 mEq/L (ref 19–32)
CREATININE: 0.95 mg/dL (ref 0.40–1.20)
Calcium: 9.1 mg/dL (ref 8.4–10.5)
Chloride: 99 mEq/L (ref 96–112)
GFR: 60.45 mL/min (ref >60.00)
GLUCOSE: 107 mg/dL — AB (ref 70–99)
Potassium: 3.7 mEq/L (ref 3.5–5.1)
Sodium: 135 mEq/L (ref 135–145)

## 2014-08-19 LAB — LIPASE: Lipase: 9 U/L — ABNORMAL LOW (ref 11.0–59.0)

## 2014-08-19 LAB — AMYLASE: Amylase: 17 U/L — ABNORMAL LOW (ref 27–131)

## 2014-08-19 MED ORDER — MEMANTINE HCL 10 MG PO TABS: 10.0000 mg | ORAL_TABLET | Freq: Two times a day (BID) | ORAL | Status: DC

## 2014-08-19 NOTE — Progress Notes (Signed)
   Subjective:    Patient ID: Carla Friedman, female    DOB: Mar 23, 1934, 81 y.o.   MRN: [id number]  HPI Here with her daughter Carla Friedman to follow up on a recent ER visit for weakness and confusion. She was treated for a UTI but her culture that day showed no growth. She did have some bilirubin in the urine but no loiver enzymes were checked. Her CBC was okay. They note that her urine at home still has a brown color at times. No fever. Her appetite has improved and she feels better overall. Her memory is getting worse however and her daughter says she gets very confused at times.   Review of Systems  Constitutional: Negative.   Respiratory: Negative.   Cardiovascular: Negative.   Genitourinary: Negative.   Psychiatric/Behavioral: Positive for hallucinations, confusion and decreased concentration. Negative for dysphoric mood and agitation.       Objective:   Physical Exam  Constitutional: She appears well-developed and well-nourished.  Alert and walking with her walker   Cardiovascular: Normal rate, regular rhythm, normal heart sounds and intact distal pulses.   Pulmonary/Chest: Effort normal and breath sounds normal.  Abdominal: Soft. Bowel sounds are normal. She exhibits no distension and no mass. There is no tenderness. There is no rebound and no guarding.  Neurological: She is alert.  Oriented to self and place but not date           Assessment & Plan:  We will recheck a UA and get labs to look at her liver function. Set a liver US as well. Try Namenda 10 mg bid for the memory.

## 2014-08-19 NOTE — Addendum Note (Signed)
Addended by: Alysia Penna A on: 08/19/2014 03:04 PM   Modules accepted: Orders

## 2014-08-19 NOTE — Progress Notes (Signed)
Pre visit review using our clinic review tool, if applicable. No additional management support is needed unless otherwise documented below in the visit note. 

## 2014-08-20 ENCOUNTER — Other Ambulatory Visit: Payer: Medicare Other

## 2014-08-20 LAB — POCT URINALYSIS DIPSTICK
Bilirubin, UA: NEGATIVE
Blood, UA: NEGATIVE
GLUCOSE UA: NEGATIVE
KETONES UA: NEGATIVE
Nitrite, UA: NEGATIVE
Protein, UA: NEGATIVE
SPEC GRAV UA: 1.01
Urobilinogen, UA: 0.2
pH, UA: 5.5

## 2014-08-20 NOTE — Addendum Note (Signed)
Addended by: Elmer Picker on: 08/20/2014 12:44 PM   Modules accepted: Orders

## 2014-08-21 DIAGNOSIS — R296 Repeated falls: Secondary | ICD-10-CM | POA: Diagnosis not present

## 2014-08-21 DIAGNOSIS — F339 Major depressive disorder, recurrent, unspecified: Secondary | ICD-10-CM | POA: Diagnosis not present

## 2014-08-21 DIAGNOSIS — I6901 Cognitive deficits following nontraumatic subarachnoid hemorrhage: Secondary | ICD-10-CM | POA: Diagnosis not present

## 2014-08-21 DIAGNOSIS — S065X0S Traumatic subdural hemorrhage without loss of consciousness, sequela: Secondary | ICD-10-CM | POA: Diagnosis not present

## 2014-08-21 DIAGNOSIS — G8929 Other chronic pain: Secondary | ICD-10-CM | POA: Diagnosis not present

## 2014-08-21 DIAGNOSIS — I25729 Atherosclerosis of autologous artery coronary artery bypass graft(s) with unspecified angina pectoris: Secondary | ICD-10-CM | POA: Diagnosis not present

## 2014-08-24 ENCOUNTER — Ambulatory Visit
Admission: RE | Admit: 2014-08-24 | Discharge: 2014-08-24 | Disposition: A | Discharge status: Home / Self Care | Payer: Medicare Other | Source: Ambulatory Visit | Attending: Family Medicine | Admitting: Family Medicine

## 2014-08-24 DIAGNOSIS — R1013 Epigastric pain: Secondary | ICD-10-CM

## 2014-08-24 DIAGNOSIS — K8020 Calculus of gallbladder without cholecystitis without obstruction: Secondary | ICD-10-CM | POA: Diagnosis not present

## 2014-08-24 DIAGNOSIS — R17 Unspecified jaundice: Secondary | ICD-10-CM | POA: Diagnosis not present

## 2014-08-24 NOTE — Addendum Note (Signed)
Addended by: Alysia Penna A on: 08/24/2014 11:04 PM   Modules accepted: Orders

## 2014-08-25 DIAGNOSIS — S065X0S Traumatic subdural hemorrhage without loss of consciousness, sequela: Secondary | ICD-10-CM | POA: Diagnosis not present

## 2014-08-25 DIAGNOSIS — G8929 Other chronic pain: Secondary | ICD-10-CM | POA: Diagnosis not present

## 2014-08-25 DIAGNOSIS — R296 Repeated falls: Secondary | ICD-10-CM | POA: Diagnosis not present

## 2014-08-25 DIAGNOSIS — I6901 Cognitive deficits following nontraumatic subarachnoid hemorrhage: Secondary | ICD-10-CM | POA: Diagnosis not present

## 2014-08-25 DIAGNOSIS — F339 Major depressive disorder, recurrent, unspecified: Secondary | ICD-10-CM | POA: Diagnosis not present

## 2014-08-25 DIAGNOSIS — I25729 Atherosclerosis of autologous artery coronary artery bypass graft(s) with unspecified angina pectoris: Secondary | ICD-10-CM | POA: Diagnosis not present

## 2014-08-27 DIAGNOSIS — F339 Major depressive disorder, recurrent, unspecified: Secondary | ICD-10-CM | POA: Diagnosis not present

## 2014-08-27 DIAGNOSIS — I25729 Atherosclerosis of autologous artery coronary artery bypass graft(s) with unspecified angina pectoris: Secondary | ICD-10-CM | POA: Diagnosis not present

## 2014-08-27 DIAGNOSIS — R296 Repeated falls: Secondary | ICD-10-CM | POA: Diagnosis not present

## 2014-08-27 DIAGNOSIS — I6901 Cognitive deficits following nontraumatic subarachnoid hemorrhage: Secondary | ICD-10-CM | POA: Diagnosis not present

## 2014-08-27 DIAGNOSIS — G8929 Other chronic pain: Secondary | ICD-10-CM | POA: Diagnosis not present

## 2014-08-27 DIAGNOSIS — S065X0S Traumatic subdural hemorrhage without loss of consciousness, sequela: Secondary | ICD-10-CM | POA: Diagnosis not present

## 2014-08-31 ENCOUNTER — Other Ambulatory Visit: Payer: Self-pay | Admitting: *Deleted

## 2014-08-31 MED ORDER — ATENOLOL 50 MG PO TABS: 50.0000 mg | ORAL_TABLET | ORAL | Status: DC

## 2014-09-03 ENCOUNTER — Other Ambulatory Visit (INDEPENDENT_AMBULATORY_CARE_PROVIDER_SITE_OTHER): Payer: Self-pay | Admitting: General Surgery

## 2014-09-03 DIAGNOSIS — R413 Other amnesia: Secondary | ICD-10-CM | POA: Diagnosis not present

## 2014-09-03 DIAGNOSIS — R1084 Generalized abdominal pain: Secondary | ICD-10-CM

## 2014-09-03 DIAGNOSIS — E118 Type 2 diabetes mellitus with unspecified complications: Secondary | ICD-10-CM | POA: Diagnosis not present

## 2014-09-03 DIAGNOSIS — R634 Abnormal weight loss: Secondary | ICD-10-CM

## 2014-09-03 DIAGNOSIS — K8020 Calculus of gallbladder without cholecystitis without obstruction: Secondary | ICD-10-CM | POA: Diagnosis not present

## 2014-09-03 DIAGNOSIS — I999 Unspecified disorder of circulatory system: Secondary | ICD-10-CM | POA: Diagnosis not present

## 2014-09-03 DIAGNOSIS — I10 Essential (primary) hypertension: Secondary | ICD-10-CM | POA: Diagnosis not present

## 2014-09-03 DIAGNOSIS — R296 Repeated falls: Secondary | ICD-10-CM | POA: Diagnosis not present

## 2014-09-04 ENCOUNTER — Other Ambulatory Visit (INDEPENDENT_AMBULATORY_CARE_PROVIDER_SITE_OTHER): Payer: Self-pay

## 2014-09-04 ENCOUNTER — Other Ambulatory Visit: Payer: Self-pay | Admitting: Family Medicine

## 2014-09-04 DIAGNOSIS — R1084 Generalized abdominal pain: Secondary | ICD-10-CM

## 2014-09-04 DIAGNOSIS — R634 Abnormal weight loss: Secondary | ICD-10-CM

## 2014-09-04 NOTE — Telephone Encounter (Signed)
Per Dr. Fry okay to refill 

## 2014-09-08 ENCOUNTER — Other Ambulatory Visit: Payer: Medicare Other

## 2014-09-15 ENCOUNTER — Ambulatory Visit: Admitting: Cardiovascular Disease

## 2014-09-16 ENCOUNTER — Other Ambulatory Visit: Payer: Medicare Other

## 2014-09-16 ENCOUNTER — Ambulatory Visit
Admission: RE | Admit: 2014-09-16 | Discharge: 2014-09-16 | Disposition: A | Discharge status: Home / Self Care | Payer: Medicare Other | Source: Ambulatory Visit | Attending: General Surgery | Admitting: General Surgery

## 2014-09-16 DIAGNOSIS — K8020 Calculus of gallbladder without cholecystitis without obstruction: Secondary | ICD-10-CM | POA: Diagnosis not present

## 2014-09-16 DIAGNOSIS — K5730 Diverticulosis of large intestine without perforation or abscess without bleeding: Secondary | ICD-10-CM | POA: Diagnosis not present

## 2014-09-16 MED ORDER — IOHEXOL 300 MG/ML  SOLN
100.0000 mL | Freq: Once | INTRAMUSCULAR | Status: AC | PRN
  Administered 2014-09-16: 100 mL via INTRAVENOUS

## 2014-09-17 ENCOUNTER — Other Ambulatory Visit: Payer: Self-pay | Admitting: Cardiovascular Disease

## 2014-09-17 DIAGNOSIS — I999 Unspecified disorder of circulatory system: Secondary | ICD-10-CM | POA: Diagnosis not present

## 2014-09-17 DIAGNOSIS — K8020 Calculus of gallbladder without cholecystitis without obstruction: Secondary | ICD-10-CM | POA: Diagnosis not present

## 2014-09-17 DIAGNOSIS — I10 Essential (primary) hypertension: Secondary | ICD-10-CM | POA: Diagnosis not present

## 2014-09-17 DIAGNOSIS — E118 Type 2 diabetes mellitus with unspecified complications: Secondary | ICD-10-CM | POA: Diagnosis not present

## 2014-09-17 DIAGNOSIS — R413 Other amnesia: Secondary | ICD-10-CM | POA: Diagnosis not present

## 2014-09-17 DIAGNOSIS — R296 Repeated falls: Secondary | ICD-10-CM | POA: Diagnosis not present

## 2014-09-23 ENCOUNTER — Encounter: Payer: Self-pay | Admitting: Cardiovascular Disease

## 2014-09-23 ENCOUNTER — Ambulatory Visit (INDEPENDENT_AMBULATORY_CARE_PROVIDER_SITE_OTHER): Payer: Medicare Other | Admitting: Cardiovascular Disease

## 2014-09-23 VITALS — BP 142/86 | HR 76 | Ht 64.0 in | Wt 135.0 lb

## 2014-09-23 DIAGNOSIS — I2510 Atherosclerotic heart disease of native coronary artery without angina pectoris: Secondary | ICD-10-CM | POA: Diagnosis not present

## 2014-09-23 DIAGNOSIS — R413 Other amnesia: Secondary | ICD-10-CM

## 2014-09-23 DIAGNOSIS — I10 Essential (primary) hypertension: Secondary | ICD-10-CM

## 2014-09-23 NOTE — Progress Notes (Signed)
Patient ID: Carla Friedman, female   DOB: 12-Dec-1933, 81 y.o.   MRN: [id number] Carla Friedman is seen today for CAD, HTN, and elevated lipids. She has had CABG with stents to the native LAD and SVG to OM. She has distal disease and chronic chest pain. She actually is seen by hospice. She prefers not to be seen in hospital alot. She has less chest pain off lisinopril and on Atenolol. . Her last cath was in 11/2006 and she had a non-ischemic myovue in 11/2007. She has morphine and oxygen at home. She has sig. depression. She looked very good today I am not convinced that all of her pains are anginal but she seems satisfied to have oxygen at home.  She also complains of SOB. it does not sound like volume there is no PND, orthopnea, or edema.  She continues to be depressed. She uses oxygen and nitro as a crutch but this has helped her  over the years.  She has significant venous insuf with marked dependant discoloration but no arterial insuf.  Ranexa has helped her quite a bit.   Now at [employer]. Increasingly forgetful  In ER with confusion recently  Primary started her on Namenda Has gallstones but no acute cholecystitis and would not do surgery unless absolutely have to    ROS: Denies fever, malais, weight loss, blurry vision, decreased visual acuity, cough, sputum, SOB, hemoptysis, pleuritic pain, palpitaitons, heartburn, abdominal pain, melena, lower extremity edema, claudication, or rash.  All other systems reviewed and negative  General: Affect appropriate Frail elderly female  HEENT:  Large echymosis over left face  Neck supple with no adenopathy JVP normal no bruits no thyromegaly Lungs clear with no wheezing and good diaphragmatic motion Heart:  S1/S2 no murmur, no rub, gallop or click PMI normal Abdomen: benighn, BS positve, no tenderness, no AAA no bruit.  No HSM or HJR Distal pulses intact with no bruits No edema Neuro non-focal Skin warm and dry No muscular  weakness   Current Outpatient Prescriptions  Medication Sig Dispense Refill  . amitriptyline (ELAVIL) 50 MG tablet Take 50 mg by mouth at bedtime.    . Ascorbic Acid (VITAMIN C) 500 MG tablet Take 500 mg by mouth daily.      Marland Kitchen aspirin 81 MG tablet Take 81 mg by mouth at bedtime.     Marland Kitchen atenolol (TENORMIN) 50 MG tablet TAKE ONE TABLET BY MOUTH EVERY DAY IN THE MORNING.  30 tablet 1  . Bee Pollen 500 MG TABS Take 500 mg by mouth daily.     . Calcium Carbonate (CALCIUM 600 PO) Take 600 mg by mouth daily.     . Cholecalciferol (VITAMIN D) 1000 UNITS capsule Take 1,000 Units by mouth daily.      . ciprofloxacin (CIPRO) 500 MG tablet TAKE ONE TABLET BY MOUTH TWICE DAILY  14 tablet 0  . clopidogrel (PLAVIX) 75 MG tablet Take 75 mg by mouth daily.    . clopidogrel (PLAVIX) 75 MG tablet TAKE ONE TABLET BY MOUTH ONE TIME DAILY  30 tablet 5  . docusate sodium 100 MG CAPS Take 100 mg by mouth daily as needed for mild constipation. (Patient not taking: Reported on 08/19/2014) 10 capsule 0  . gabapentin (NEURONTIN) 100 MG capsule TAKE ONE CAPSULE BY MOUTH THREE TIMES DAILY  (Patient taking differently: TAKE ONE CAPSULE BY MOUTH THREE TIMES DAILY as needed) 90 capsule 11  . isosorbide dinitrate (ISORDIL) 30 MG tablet Take 30 mg by mouth 2 (two)  times daily.    . memantine (NAMENDA) 10 MG tablet Take 1 tablet (10 mg total) by mouth 2 (two) times daily. 60 tablet 5  . metFORMIN (GLUCOPHAGE) 1000 MG tablet Take 1,000 mg by mouth daily with breakfast.    . Multiple Vitamin (MULTIVITAMIN) capsule Take 1 capsule by mouth daily.      . nitroGLYCERIN (NITROSTAT) 0.4 MG SL tablet Place 0.4 mg under the tongue every 5 (five) minutes as needed for chest pain.     Marland Kitchen oxycodone (OXY-IR) 5 MG capsule Take 1 capsule (5 mg total) by mouth every 4 (four) hours as needed (severe pain). (Patient not taking: Reported on 08/19/2014) 15 capsule 0  . pravastatin (PRAVACHOL) 40 MG tablet Take 40 mg by mouth every evening.    .  pravastatin (PRAVACHOL) 40 MG tablet TAKE ONE TABLET BY MOUTH ONE TIME DAILY  30 tablet 5  . promethazine (PHENERGAN) 25 MG tablet Take 25 mg by mouth every 6 (six) hours as needed for nausea or vomiting.     . ramipril (ALTACE) 10 MG capsule TAKE ONE CAPSULE BY MOUTH ONE TIME DAILY  30 capsule 11  . RANEXA 500 MG 12 hr tablet TAKE ONE TABLET BY MOUTH TWICE DAILY  60 tablet 5  . valACYclovir (VALTREX) 500 MG tablet Take 1 tablet (500 mg total) by mouth 2 (two) times daily as needed (fever blisters). (Patient not taking: Reported on 07/13/2014) 20 tablet 5   No current facility-administered medications for this visit.    Allergies  Anbesol cold sore therapy; Morphine; Other; and Codeine  Electrocardiogram:  SR rate 64  LAD no acute ST/ T wave changes 11/15  Assessment and Plan

## 2014-09-23 NOTE — Patient Instructions (Signed)
**Note De-identified  Obfuscation** Your physician recommends that you continue on your current medications as directed. Please refer to the Current Medication list given to you today.  Your physician recommends that you schedule a follow-up appointment in: 3 months  

## 2014-09-23 NOTE — Assessment & Plan Note (Signed)
Progressive no change with Namenda  Daughter does a good job looking after her

## 2014-09-23 NOTE — Assessment & Plan Note (Signed)
Well controlled.  Continue current medications and low sodium Dash type diet.    

## 2014-09-23 NOTE — Assessment & Plan Note (Signed)
Stable with no angina and good activity level.  Continue medical Rx Ranexa has worked well

## 2014-10-20 ENCOUNTER — Encounter: Payer: Self-pay | Admitting: Internal Medicine

## 2014-10-20 ENCOUNTER — Ambulatory Visit (INDEPENDENT_AMBULATORY_CARE_PROVIDER_SITE_OTHER): Payer: Medicare Other | Admitting: Internal Medicine

## 2014-10-20 VITALS — BP 128/70 | HR 101 | Temp 98.0°F | Resp 24 | Ht 64.0 in | Wt 141.0 lb

## 2014-10-20 DIAGNOSIS — I2510 Atherosclerotic heart disease of native coronary artery without angina pectoris: Secondary | ICD-10-CM | POA: Diagnosis not present

## 2014-10-20 DIAGNOSIS — E119 Type 2 diabetes mellitus without complications: Secondary | ICD-10-CM

## 2014-10-20 DIAGNOSIS — I10 Essential (primary) hypertension: Secondary | ICD-10-CM

## 2014-10-20 NOTE — Progress Notes (Signed)
Pre visit review using our clinic review tool, if applicable. No additional management support is needed unless otherwise documented below in the visit note. 

## 2014-10-20 NOTE — Progress Notes (Signed)
Subjective:    Patient ID: Carla Friedman, female    DOB: 1933/11/02, 81 y.o.   MRN: [id number]  HPI  81 year old patient who stable medical problems include coronary artery disease, essential hypertension and type 2 diabetes. She is a resident of [facility] and is accompanied by her daughter today.  She has a 2 and a half week history of cough, fatigue and anorexia.  There is been some minimum sputum production.  No fever.  She has chronic DOE which has not really worsened.  She does use oxygen when necessary. No chest pain or worsening dyspnea.  Cough is her predominant symptom  Past Medical History  Diagnosis Date  . Coronary artery disease     sees Dr. Johnsie Cancel, in Hospice for this   . Chest pain, atypical   . PVD (peripheral vascular disease)   . Hypertension   . Dyslipidemia   . Diabetes mellitus type II   . Peptic ulcer disease   . Depression   . Diabetic neuropathy   . Hypothyroidism   . Back pain     low  . Cancer, skin, squamous cell     sees Dr. Lois Huxley at [facility] dermatology  . Confusion     History   Social History  . Marital Status: Widowed    Spouse Name: N/A  . Number of Children: 4  . Years of Education: N/A   Occupational History  . RETIRED    Social History Main Topics  . Smoking status: Former Research scientist (life sciences)  . Smokeless tobacco: Never Used  . Alcohol Use: No  . Drug Use: No  . Sexual Activity: Not on file   Other Topics Concern  . Not on file   Social History Narrative    Past Surgical History  Procedure Laterality Date  . Appendectomy    . Tonsillectomy    . Coronary artery bypass graft      stent placement 2001  . Cataract extraction    . Colonoscopy  02-20-08    per Dr. Olevia Perches, diverticulosis, no polyps, repeat in 10 yrs     Family History  Problem Relation Age of Onset  . Emphysema Mother   . Heart attack Father   . Diabetes Father   . Breast cancer Maternal Grandmother   . Diabetes Paternal Grandfather   .  Hypertension      family history    Allergies  Allergen Reactions  . Anbesol Cold Sore Therapy [Lip Medex]     Caused lips to swell  . Morphine Other (See Comments)    Respiratory distress  . Other Other (See Comments)    Vicryl Stitches  "body rejects them"  . Codeine Other (See Comments)    Respiratory distress    Current Outpatient Prescriptions on File Prior to Visit  Medication Sig Dispense Refill  . amitriptyline (ELAVIL) 50 MG tablet Take 50 mg by mouth at bedtime.    . Ascorbic Acid (VITAMIN C) 500 MG tablet Take 500 mg by mouth daily.      Marland Kitchen aspirin 81 MG tablet Take 81 mg by mouth at bedtime.     Marland Kitchen atenolol (TENORMIN) 50 MG tablet TAKE ONE TABLET BY MOUTH EVERY DAY IN THE MORNING.  30 tablet 1  . Bee Pollen 500 MG TABS Take 500 mg by mouth daily.     . Calcium Carbonate (CALCIUM 600 PO) Take 600 mg by mouth daily.     . Cholecalciferol (VITAMIN D) 1000 UNITS capsule Take  1,000 Units by mouth daily.      . clopidogrel (PLAVIX) 75 MG tablet TAKE ONE TABLET BY MOUTH ONE TIME DAILY  30 tablet 5  . docusate sodium 100 MG CAPS Take 100 mg by mouth daily as needed for mild constipation. 10 capsule 0  . gabapentin (NEURONTIN) 100 MG capsule TAKE ONE CAPSULE BY MOUTH THREE TIMES DAILY  (Patient taking differently: TAKE ONE CAPSULE BY MOUTH THREE TIMES DAILY as needed) 90 capsule 11  . isosorbide dinitrate (ISORDIL) 30 MG tablet Take 30 mg by mouth 2 (two) times daily.    . memantine (NAMENDA) 10 MG tablet Take 1 tablet (10 mg total) by mouth 2 (two) times daily. 60 tablet 5  . metFORMIN (GLUCOPHAGE) 1000 MG tablet Take 1,000 mg by mouth daily with breakfast.    . Multiple Vitamin (MULTIVITAMIN) capsule Take 1 capsule by mouth daily.      . nitroGLYCERIN (NITROSTAT) 0.4 MG SL tablet Place 0.4 mg under the tongue every 5 (five) minutes as needed for chest pain.     . pravastatin (PRAVACHOL) 40 MG tablet TAKE ONE TABLET BY MOUTH ONE TIME DAILY  30 tablet 5  . ramipril (ALTACE) 10 MG  capsule TAKE ONE CAPSULE BY MOUTH ONE TIME DAILY  30 capsule 11  . RANEXA 500 MG 12 hr tablet TAKE ONE TABLET BY MOUTH TWICE DAILY  60 tablet 5  . valACYclovir (VALTREX) 500 MG tablet Take 1 tablet (500 mg total) by mouth 2 (two) times daily as needed (fever blisters). 20 tablet 5  . oxycodone (OXY-IR) 5 MG capsule Take 1 capsule (5 mg total) by mouth every 4 (four) hours as needed (severe pain). (Patient not taking: Reported on 10/20/2014) 15 capsule 0  . promethazine (PHENERGAN) 25 MG tablet Take 25 mg by mouth every 6 (six) hours as needed for nausea or vomiting.      No current facility-administered medications on file prior to visit.    BP 128/70 mmHg  Pulse 101  Temp(Src) 98 F (36.7 C) (Oral)  Resp 24  Ht 5\' 4"  (1.626 m)  Wt 141 lb (63.957 kg)  BMI 24.19 kg/m2  SpO2 98%      Review of Systems  Constitutional: Positive for activity change, appetite change and fatigue.  HENT: Negative for congestion, dental problem, hearing loss, rhinorrhea, sinus pressure, sore throat and tinnitus.   Eyes: Negative for pain, discharge and visual disturbance.  Respiratory: Positive for cough. Negative for shortness of breath.   Cardiovascular: Negative for chest pain, palpitations and leg swelling.  Gastrointestinal: Negative for nausea, vomiting, abdominal pain, diarrhea, constipation, blood in stool and abdominal distention.  Genitourinary: Negative for dysuria, urgency, frequency, hematuria, flank pain, vaginal bleeding, vaginal discharge, difficulty urinating, vaginal pain and pelvic pain.  Musculoskeletal: Negative for joint swelling, arthralgias and gait problem.  Skin: Negative for rash.  Neurological: Negative for dizziness, syncope, speech difficulty, weakness, numbness and headaches.  Hematological: Negative for adenopathy.  Psychiatric/Behavioral: Negative for behavioral problems, dysphoric mood and agitation. The patient is not nervous/anxious.        Objective:   Physical Exam   Constitutional: She is oriented to person, place, and time. She appears well-developed and well-nourished.  HENT:  Head: Normocephalic.  Right Ear: External ear normal.  Left Ear: External ear normal.  Mouth/Throat: Oropharynx is clear and moist.  Eyes: Conjunctivae and EOM are normal. Pupils are equal, round, and reactive to light.  Neck: Normal range of motion. Neck supple. No thyromegaly present.  Cardiovascular:  Normal rate, regular rhythm and normal heart sounds.   Pulse rate 90-100  Pulmonary/Chest: Effort normal and breath sounds normal.  Rare scattered rhonchi No distress O2 sats ration 98%  Abdominal: Soft. Bowel sounds are normal. She exhibits no mass. There is no tenderness.  Musculoskeletal: Normal range of motion.  Trace lower extremity edema  Lymphadenopathy:    She has no cervical adenopathy.  Neurological: She is alert and oriented to person, place, and time.  Skin: Skin is warm and dry. No rash noted.  Psychiatric: She has a normal mood and affect. Her behavior is normal.          Assessment & Plan:   Viral bronchitis.  Will treat symptomatically Essential hypertension, stable Coronary artery disease, stable Type 2 diabetes.  No change in medications  Return when necessary or 3 months with PCP.  Follow-up

## 2014-10-20 NOTE — Patient Instructions (Signed)
Acute bronchitis symptoms are generally not helped by antibiotics.  Take over-the-counter expectorants and cough medications such as  Mucinex DM.  Call if there is no improvement in 5 to 7 days or if  you develop worsening cough, fever, or new symptoms, such as shortness of breath or chest pain.  Acute bronchitis usually goes away in a couple weeks. Oftentimes, no medical treatment is necessary. Medicines are sometimes given for relief of fever or cough. Antibiotic medicines are usually not needed but may be prescribed in certain situations. In some cases, an inhaler may be recommended to help reduce shortness of breath and control the cough. A cool mist vaporizer may also be used to help thin bronchial secretions and make it easier to clear the chest.  HOME CARE INSTRUCTIONS  Get plenty of rest.   Drink enough fluids to keep your urine clear or pale yellow (unless you have a medical condition that requires fluid restriction). Increasing fluids may help thin your respiratory secretions (sputum) and reduce chest congestion, and it will prevent dehydration.   Take medicines only as directed by your health care provider.    Reduce the chances of another bout of acute bronchitis by washing your hands frequently, avoiding people with cold symptoms, and trying not to touch your hands to your mouth, nose, or eyes.    I SEEK IMMEDIATE MEDICAL CARE IF:  You develop an increased fever or chills.   You have chest pain.   You have severe shortness of breath.  You have bloody sputum.   You develop dehydration.  You faint or repeatedly feel like you are going to pass out.  You develop repeated vomiting.  You develop a severe headache.

## 2014-10-21 ENCOUNTER — Ambulatory Visit: Payer: Medicare Other | Admitting: Family Medicine

## 2014-10-31 ENCOUNTER — Emergency Department (HOSPITAL_COMMUNITY)
Admission: EM | Admit: 2014-10-31 | Discharge: 2014-10-31 | Disposition: A | Discharge status: Home / Self Care | Payer: Medicare Other | Attending: Emergency Medicine | Admitting: Emergency Medicine

## 2014-10-31 ENCOUNTER — Emergency Department (HOSPITAL_COMMUNITY): Payer: Medicare Other

## 2014-10-31 ENCOUNTER — Encounter (HOSPITAL_COMMUNITY): Payer: Self-pay

## 2014-10-31 DIAGNOSIS — Z7982 Long term (current) use of aspirin: Secondary | ICD-10-CM | POA: Diagnosis not present

## 2014-10-31 DIAGNOSIS — E785 Hyperlipidemia, unspecified: Secondary | ICD-10-CM | POA: Diagnosis not present

## 2014-10-31 DIAGNOSIS — S0003XA Contusion of scalp, initial encounter: Secondary | ICD-10-CM | POA: Diagnosis not present

## 2014-10-31 DIAGNOSIS — S0083XA Contusion of other part of head, initial encounter: Secondary | ICD-10-CM | POA: Diagnosis not present

## 2014-10-31 DIAGNOSIS — Z951 Presence of aortocoronary bypass graft: Secondary | ICD-10-CM | POA: Diagnosis not present

## 2014-10-31 DIAGNOSIS — W1839XA Other fall on same level, initial encounter: Secondary | ICD-10-CM | POA: Diagnosis not present

## 2014-10-31 DIAGNOSIS — I2510 Atherosclerotic heart disease of native coronary artery without angina pectoris: Secondary | ICD-10-CM | POA: Insufficient documentation

## 2014-10-31 DIAGNOSIS — Z7902 Long term (current) use of antithrombotics/antiplatelets: Secondary | ICD-10-CM | POA: Insufficient documentation

## 2014-10-31 DIAGNOSIS — M7989 Other specified soft tissue disorders: Secondary | ICD-10-CM | POA: Diagnosis not present

## 2014-10-31 DIAGNOSIS — R51 Headache: Secondary | ICD-10-CM | POA: Diagnosis not present

## 2014-10-31 DIAGNOSIS — S6991XA Unspecified injury of right wrist, hand and finger(s), initial encounter: Secondary | ICD-10-CM | POA: Diagnosis not present

## 2014-10-31 DIAGNOSIS — S0990XA Unspecified injury of head, initial encounter: Secondary | ICD-10-CM | POA: Diagnosis present

## 2014-10-31 DIAGNOSIS — W19XXXA Unspecified fall, initial encounter: Secondary | ICD-10-CM

## 2014-10-31 DIAGNOSIS — F329 Major depressive disorder, single episode, unspecified: Secondary | ICD-10-CM | POA: Insufficient documentation

## 2014-10-31 DIAGNOSIS — Z8711 Personal history of peptic ulcer disease: Secondary | ICD-10-CM | POA: Diagnosis not present

## 2014-10-31 DIAGNOSIS — Y92002 Bathroom of unspecified non-institutional (private) residence single-family (private) house as the place of occurrence of the external cause: Secondary | ICD-10-CM | POA: Insufficient documentation

## 2014-10-31 DIAGNOSIS — Z87891 Personal history of nicotine dependence: Secondary | ICD-10-CM | POA: Diagnosis not present

## 2014-10-31 DIAGNOSIS — Y9389 Activity, other specified: Secondary | ICD-10-CM | POA: Insufficient documentation

## 2014-10-31 DIAGNOSIS — Z79899 Other long term (current) drug therapy: Secondary | ICD-10-CM | POA: Insufficient documentation

## 2014-10-31 DIAGNOSIS — S064X0A Epidural hemorrhage without loss of consciousness, initial encounter: Secondary | ICD-10-CM | POA: Diagnosis not present

## 2014-10-31 DIAGNOSIS — I10 Essential (primary) hypertension: Secondary | ICD-10-CM | POA: Insufficient documentation

## 2014-10-31 DIAGNOSIS — M25531 Pain in right wrist: Secondary | ICD-10-CM | POA: Diagnosis not present

## 2014-10-31 DIAGNOSIS — Y998 Other external cause status: Secondary | ICD-10-CM | POA: Diagnosis not present

## 2014-10-31 DIAGNOSIS — Z85828 Personal history of other malignant neoplasm of skin: Secondary | ICD-10-CM | POA: Insufficient documentation

## 2014-10-31 DIAGNOSIS — E1140 Type 2 diabetes mellitus with diabetic neuropathy, unspecified: Secondary | ICD-10-CM | POA: Diagnosis not present

## 2014-10-31 LAB — CBG MONITORING, ED: Glucose-Capillary: 125 mg/dL — ABNORMAL HIGH (ref 70–99)

## 2014-10-31 MED ORDER — ONDANSETRON 4 MG PO TBDP
4.0000 mg | ORAL_TABLET | Freq: Once | ORAL | Status: AC
  Administered 2014-10-31: 4 mg via ORAL
  Filled 2014-10-31: qty 1

## 2014-10-31 MED ORDER — MORPHINE SULFATE 4 MG/ML IJ SOLN: 4.0000 mg | Freq: Once | INTRAMUSCULAR | Status: DC

## 2014-10-31 MED ORDER — HYDROCODONE-ACETAMINOPHEN 5-325 MG PO TABS
1.0000 | ORAL_TABLET | Freq: Once | ORAL | Status: AC
  Administered 2014-10-31: 1 via ORAL
  Filled 2014-10-31: qty 1

## 2014-10-31 MED ORDER — HYDROMORPHONE HCL 1 MG/ML IJ SOLN
0.2500 mg | Freq: Once | INTRAMUSCULAR | Status: AC
  Administered 2014-10-31: 0.25 mg via INTRAMUSCULAR
  Filled 2014-10-31: qty 1

## 2014-10-31 MED ORDER — HYDROMORPHONE HCL 1 MG/ML IJ SOLN: 0.2500 mg | INTRAMUSCULAR | Status: DC | PRN

## 2014-10-31 MED ORDER — ONDANSETRON 4 MG PO TBDP: 4.0000 mg | ORAL_TABLET | Freq: Three times a day (TID) | ORAL | Status: AC | PRN

## 2014-10-31 MED ORDER — HYDROCODONE-ACETAMINOPHEN 5-325 MG PO TABS: 2.0000 | ORAL_TABLET | ORAL | Status: DC | PRN

## 2014-10-31 NOTE — Discharge Instructions (Signed)
Ice intermittently to the sore swollen area on your scalp.  Facial or Scalp Contusion  A facial or scalp contusion is a deep bruise on the face or head. Contusions happen when an injury causes bleeding under the skin. Signs of bruising include pain, puffiness (swelling), and discolored skin. The contusion may turn blue, purple, or yellow. HOME CARE  Only take medicines as told by your doctor.  Put ice on the injured area.  Put ice in a plastic bag.  Place a towel between your skin and the bag.  Leave the ice on for 20 minutes, 2-3 times a day. GET HELP IF:  You have bite problems.  You have pain when chewing.  You are worried about your face not healing normally. GET HELP RIGHT AWAY IF:   You have severe pain or a headache and medicine does not help.  You are very tired or confused, or your personality changes.  You throw up (vomit).  You have a nosebleed that will not stop.  You see two of everything (double vision) or have blurry vision.  You have fluid coming from your nose or ear.  You have problems walking or using your arms or legs. MAKE SURE YOU:   Understand these instructions.  Will watch your condition.  Will get help right away if you are not doing well or get worse. Document Released: 06/29/2011 Document Revised: 04/30/2013 Document Reviewed: 02/20/2013 [facility] Patient Information 2015 [place], [place]. This information is not intended to replace advice given to you by your health care provider. Make sure you discuss any questions you have with your health care provider.  Hematoma A hematoma is a collection of blood under the skin, in an organ, in a body space, in a joint space, or in other tissue. The blood can clot to form a lump that you can see and feel. The lump is often firm and may sometimes become sore and tender. Most hematomas get better in a few days to weeks. However, some hematomas may be serious and require medical care. Hematomas can range in  size from very small to very large. CAUSES  A hematoma can be caused by a blunt or penetrating injury. It can also be caused by spontaneous leakage from a blood vessel under the skin. Spontaneous leakage from a blood vessel is more likely to occur in older people, especially those taking blood thinners. Sometimes, a hematoma can develop after certain medical procedures. SIGNS AND SYMPTOMS   A firm lump on the body.  Possible pain and tenderness in the area.  Bruising.Blue, dark blue, purple-red, or yellowish skin may appear at the site of the hematoma if the hematoma is close to the surface of the skin. For hematomas in deeper tissues or body spaces, the signs and symptoms may be subtle. For example, an intra-abdominal hematoma may cause abdominal pain, weakness, fainting, and shortness of breath. An intracranial hematoma may cause a headache or symptoms such as weakness, trouble speaking, or a change in consciousness. DIAGNOSIS  A hematoma can usually be diagnosed based on your medical history and a physical exam. Imaging tests may be needed if your health care provider suspects a hematoma in deeper tissues or body spaces, such as the abdomen, head, or chest. These tests may include ultrasonography or a CT scan.  TREATMENT  Hematomas usually go away on their own over time. Rarely does the blood need to be drained out of the body. Large hematomas or those that may affect vital organs will  sometimes need surgical drainage or monitoring. HOME CARE INSTRUCTIONS   Apply ice to the injured area:   Put ice in a plastic bag.   Place a towel between your skin and the bag.   Leave the ice on for 20 minutes, 2-3 times a day for the first 1 to 2 days.   After the first 2 days, switch to using warm compresses on the hematoma.   Elevate the injured area to help decrease pain and swelling. Wrapping the area with an elastic bandage may also be helpful. Compression helps to reduce swelling and  promotes shrinking of the hematoma. Make sure the bandage is not wrapped too tight.   If your hematoma is on a lower extremity and is painful, crutches may be helpful for a couple days.   Only take over-the-counter or prescription medicines as directed by your health care provider. SEEK IMMEDIATE MEDICAL CARE IF:   You have increasing pain, or your pain is not controlled with medicine.   You have a fever.   You have worsening swelling or discoloration.   Your skin over the hematoma breaks or starts bleeding.   Your hematoma is in your chest or abdomen and you have weakness, shortness of breath, or a change in consciousness.  Your hematoma is on your scalp (caused by a fall or injury) and you have a worsening headache or a change in alertness or consciousness. MAKE SURE YOU:   Understand these instructions.  Will watch your condition.  Will get help right away if you are not doing well or get worse. Document Released: 02/22/2004 Document Revised: 03/12/2013 Document Reviewed: 12/18/2012 [facility] Patient Information 2015 [place], [place]. This information is not intended to replace advice given to you by your health care provider. Make sure you discuss any questions you have with your health care provider.

## 2014-10-31 NOTE — ED Notes (Signed)
Pt placed on 5-lead cardiac monitor, pulse ox, and BP.

## 2014-10-31 NOTE — ED Notes (Signed)
Daughter helped patient back into bed. Pt tolerated well

## 2014-10-31 NOTE — ED Notes (Signed)
Pt returned from Radiology.

## 2014-10-31 NOTE — ED Notes (Signed)
Pt back on pulse ox, BP, and 5-lead cardiac monitor. Pt daughter requesting blood sugar to be checked and for pt to have food or apple juice. Cecille Rubin, RN informed daughter to wait on CT results before giving food and drink.

## 2014-10-31 NOTE — ED Notes (Signed)
Ice pak given

## 2014-10-31 NOTE — ED Notes (Addendum)
Pt. [place] backwards this morning coming out of the bathroom  Pt. Does not remember the fall denies any back pain or neck.  Pt. Does have a hematoma rt. paritel area. GCS 15.  Hypertensive at the scene 218/120 cbg 170, HR 90.  Pt. Does have a headache and a burning sensation of the hematoma.  LAST bp 176/108.   Pt. Ambulates with a walker. Pt/ is on a blood thinner

## 2014-10-31 NOTE — ED Provider Notes (Signed)
CSN: [id number]     Arrival date & time 10/31/14  1156 History   First MD Initiated Contact with Patient 10/31/14 1210     Chief Complaint  Patient presents with  . Fall      HPI  Patient presents for evaluation after a fall. She was standing to get to her walker coming out of the restroom. Nystatin when she was grabbing her walker with her hands. She fell backwards shock her head on a thin carpeted floor. Has some pain to the right wrist and swelling of the right scalp. No loss of conscious. No neck pain or back pain. No syncope or symptoms prior to the fall. No loss of consciousness or amnesia since the fall.  Past Medical History  Diagnosis Date  . Coronary artery disease     sees Dr. Johnsie Cancel, in Hospice for this   . Chest pain, atypical   . PVD (peripheral vascular disease)   . Hypertension   . Dyslipidemia   . Diabetes mellitus type II   . Peptic ulcer disease   . Depression   . Diabetic neuropathy   . Hypothyroidism   . Back pain     low  . Cancer, skin, squamous cell     sees Dr. Lois Huxley at [facility] dermatology  . Confusion    Past Surgical History  Procedure Laterality Date  . Appendectomy    . Tonsillectomy    . Coronary artery bypass graft      stent placement 2001  . Cataract extraction    . Colonoscopy  02-20-08    per Dr. Olevia Perches, diverticulosis, no polyps, repeat in 10 yrs    Family History  Problem Relation Age of Onset  . Emphysema Mother   . Heart attack Father   . Diabetes Father   . Breast cancer Maternal Grandmother   . Diabetes Paternal Grandfather   . Hypertension      family history   History  Substance Use Topics  . Smoking status: Former Research scientist (life sciences)  . Smokeless tobacco: Never Used  . Alcohol Use: No   OB History    No data available     Review of Systems  Constitutional: Negative for fever, chills, diaphoresis, appetite change and fatigue.  HENT: Negative for mouth sores, sore throat and trouble swallowing.        Soft tissue  swelling to the right posterior scalp.  Eyes: Negative for visual disturbance.  Respiratory: Negative for cough, chest tightness, shortness of breath and wheezing.   Cardiovascular: Negative for chest pain.  Gastrointestinal: Negative for nausea, vomiting, abdominal pain, diarrhea and abdominal distention.  Endocrine: Negative for polydipsia, polyphagia and polyuria.  Genitourinary: Negative for dysuria, frequency and hematuria.  Musculoskeletal: Negative for gait problem.  Skin: Negative for color change, pallor and rash.  Neurological: Positive for headaches. Negative for dizziness, syncope and light-headedness.  Hematological: Does not bruise/bleed easily.  Psychiatric/Behavioral: Negative for behavioral problems and confusion.      Allergies  Anbesol cold sore therapy; Morphine; Other; and Codeine  Home Medications   Prior to Admission medications   Medication Sig Start Date End Date Taking? Authorizing Provider  amitriptyline (ELAVIL) 50 MG tablet Take 50 mg by mouth at bedtime.    Historical Provider, MD  Ascorbic Acid (VITAMIN C) 500 MG tablet Take 500 mg by mouth daily.      Historical Provider, MD  aspirin 81 MG tablet Take 81 mg by mouth at bedtime.     Historical Provider,  MD  atenolol (TENORMIN) 50 MG tablet TAKE ONE TABLET BY MOUTH EVERY DAY IN THE MORNING.  09/17/14   Josue Hector, MD  Bee Pollen 500 MG TABS Take 500 mg by mouth daily.     Historical Provider, MD  Calcium Carbonate (CALCIUM 600 PO) Take 600 mg by mouth daily.     Historical Provider, MD  Cholecalciferol (VITAMIN D) 1000 UNITS capsule Take 1,000 Units by mouth daily.      Historical Provider, MD  clopidogrel (PLAVIX) 75 MG tablet TAKE ONE TABLET BY MOUTH ONE TIME DAILY  09/17/14   Josue Hector, MD  docusate sodium 100 MG CAPS Take 100 mg by mouth daily as needed for mild constipation. 06/08/14   Geradine Girt, DO  gabapentin (NEURONTIN) 100 MG capsule TAKE ONE CAPSULE BY MOUTH THREE TIMES DAILY    Patient taking differently: TAKE ONE CAPSULE BY MOUTH THREE TIMES DAILY as needed 06/09/14   Laurey Morale, MD  HYDROcodone-acetaminophen (NORCO/VICODIN) 5-325 MG per tablet Take 2 tablets by mouth every 4 (four) hours as needed. 10/31/14   Tanna Furry, MD  isosorbide dinitrate (ISORDIL) 30 MG tablet Take 30 mg by mouth 2 (two) times daily.    Historical Provider, MD  memantine (NAMENDA) 10 MG tablet Take 1 tablet (10 mg total) by mouth 2 (two) times daily. 08/19/14   Laurey Morale, MD  metFORMIN (GLUCOPHAGE) 1000 MG tablet Take 1,000 mg by mouth daily with breakfast.    Historical Provider, MD  Multiple Vitamin (MULTIVITAMIN) capsule Take 1 capsule by mouth daily.      Historical Provider, MD  nitroGLYCERIN (NITROSTAT) 0.4 MG SL tablet Place 0.4 mg under the tongue every 5 (five) minutes as needed for chest pain.     Historical Provider, MD  ondansetron (ZOFRAN ODT) 4 MG disintegrating tablet Take 1 tablet (4 mg total) by mouth every 8 (eight) hours as needed for nausea. 10/31/14   Tanna Furry, MD  oxycodone (OXY-IR) 5 MG capsule Take 1 capsule (5 mg total) by mouth every 4 (four) hours as needed (severe pain). Patient not taking: Reported on 10/20/2014 06/08/14   Geradine Girt, DO  pravastatin (PRAVACHOL) 40 MG tablet TAKE ONE TABLET BY MOUTH ONE TIME DAILY  09/17/14   Josue Hector, MD  promethazine (PHENERGAN) 25 MG tablet Take 25 mg by mouth every 6 (six) hours as needed for nausea or vomiting.     Historical Provider, MD  ramipril (ALTACE) 10 MG capsule TAKE ONE CAPSULE BY MOUTH ONE TIME DAILY  06/09/14   Laurey Morale, MD  RANEXA 500 MG 12 hr tablet TAKE ONE TABLET BY MOUTH TWICE DAILY  07/10/14   Josue Hector, MD  valACYclovir (VALTREX) 500 MG tablet Take 1 tablet (500 mg total) by mouth 2 (two) times daily as needed (fever blisters). 06/08/14   Jessica U Vann, DO   BP 187/73 mmHg  Pulse 92  Temp(Src) 98 F (36.7 C) (Oral)  Resp 18  Ht 5\' 4"  (1.626 m)  Wt 144 lb (65.318 kg)  BMI 24.71  kg/m2  SpO2 96% Physical Exam  Constitutional: She is oriented to person, place, and time. She appears well-developed and well-nourished. No distress.  HENT:  Head: Normocephalic.    Eyes: Conjunctivae are normal. Pupils are equal, round, and reactive to light. No scleral icterus.  Neck: Normal range of motion. Neck supple. No thyromegaly present.  Nontender over the midline neck and spine.  Cardiovascular: Normal rate and regular rhythm.  Exam reveals no gallop and no friction rub.   No murmur heard. Pulmonary/Chest: Effort normal and breath sounds normal. No respiratory distress. She has no wheezes. She has no rales.  Abdominal: Soft. Bowel sounds are normal. She exhibits no distension. There is no tenderness. There is no rebound.  Musculoskeletal: Normal range of motion.       Arms: Neurological: She is alert and oriented to person, place, and time.  Skin: Skin is warm and dry. No rash noted.  Psychiatric: She has a normal mood and affect. Her behavior is normal.    ED Course  Procedures (including critical care time) Labs Review Labs Reviewed  CBG MONITORING, ED - Abnormal; Notable for the following:    Glucose-Capillary 125 (*)    All other components within normal limits    Imaging Review Dg Wrist Complete Right  10/31/2014   CLINICAL DATA:  Fall with medial right wrist pain and swelling. Initial encounter.  EXAM: RIGHT WRIST - COMPLETE 3+ VIEW  COMPARISON:  None.  FINDINGS: Soft tissue swelling is identified.  There is no evidence of acute fracture or dislocation.  Severe degenerative changes at the first carpometacarpal joint noted.  The joint spaces are otherwise unremarkable.  IMPRESSION: Soft tissue swelling without acute bony abnormality.  Severe degenerative changes at the first carpometacarpal joint.   Electronically Signed   By: Margarette Canada M.D.   On: 10/31/2014 14:03   Ct Head Wo Contrast  10/31/2014   CLINICAL DATA:  Patient fell backwards this morning and now with  hematoma to the right occipital area. Patient is on blood thinners.  EXAM: CT HEAD WITHOUT CONTRAST  TECHNIQUE: Contiguous axial images were obtained from the base of the skull through the vertex without intravenous contrast.  COMPARISON:  06/06/2014  FINDINGS: There is a large (at least 5.9 x 1.7 cm) hematoma about the right posterior parietal calvarium (representative image 28, series 2). This hematoma is without associated radiopaque foreign body or displaced calvarial fracture.  Similar findings of mild atrophy with diffuse sulcal prominence. Bilateral basal ganglial calcifications. Scattered minimal periventricular hypodensities suggestive of microvascular ischemic disease. No CT evidence acute large territory infarct. No intraparenchymal or extra-axial mass or hemorrhage. Intracranial atherosclerosis. Limited visualization the paranasal sinuses and mastoid air cells is normal. No air-fluid levels.  IMPRESSION: 1. Large (at least 5.9 cm) hematoma about the right posterior parietal calvarium without associated radiopaque foreign body, displaced calvarial fracture or acute intracranial process. 2. Similar findings of mild atrophy and microvascular ischemic disease.   Electronically Signed   By: Sandi Mariscal M.D.   On: 10/31/2014 13:45     EKG Interpretation None      MDM   Final diagnoses:  Fall  Scalp hematoma, initial encounter    Scalp hematoma and soft tissue swelling at the wrist area and no bony abnormality of the wrist. No cranial hemorrhage, extraction of fluid or blood collection, or skull fracture noted. Some relief with by mouth Vicodin. Given 4 of IM morphine and Zofran. Plan is home, when necessary Vicodin, ice, expectant management. Return to ER with any worsening symptoms.    Tanna Furry, MD 10/31/14 [phone]

## 2014-10-31 NOTE — ED Notes (Signed)
Pt on bedside toilet. Requested that daughter stand with.

## 2014-11-02 ENCOUNTER — Ambulatory Visit (INDEPENDENT_AMBULATORY_CARE_PROVIDER_SITE_OTHER): Payer: Medicare Other | Admitting: Family Medicine

## 2014-11-02 ENCOUNTER — Encounter: Payer: Self-pay | Admitting: Family Medicine

## 2014-11-02 ENCOUNTER — Ambulatory Visit (INDEPENDENT_AMBULATORY_CARE_PROVIDER_SITE_OTHER)
Admission: RE | Admit: 2014-11-02 | Discharge: 2014-11-02 | Disposition: A | Discharge status: Home / Self Care | Payer: Medicare Other | Source: Ambulatory Visit | Attending: Family Medicine | Admitting: Family Medicine

## 2014-11-02 VITALS — BP 121/66 | Temp 98.0°F | Wt 135.0 lb

## 2014-11-02 DIAGNOSIS — E119 Type 2 diabetes mellitus without complications: Secondary | ICD-10-CM

## 2014-11-02 DIAGNOSIS — R296 Repeated falls: Secondary | ICD-10-CM

## 2014-11-02 DIAGNOSIS — I2510 Atherosclerotic heart disease of native coronary artery without angina pectoris: Secondary | ICD-10-CM | POA: Diagnosis not present

## 2014-11-02 DIAGNOSIS — M25551 Pain in right hip: Secondary | ICD-10-CM | POA: Diagnosis not present

## 2014-11-02 NOTE — Progress Notes (Signed)
   Subjective:    Patient ID: Carla Friedman, female    DOB: April 27, 1934, 81 y.o.   MRN: [id number]  HPI Here to follow up an ER visit on 10-31-14 after a fall at home. She apparently lost her balance while reaching for her walker after going to the bathroom. She fell on the right side of the body and bumped the back of her head. No LOC. At the ER a CT scan of the head was normal and Xrays of the right hand and wrist showed no fractures. She has had a lot of pain in the right hip since then and she cannot walk due to pain in the hip and due to loss of balance. She is off all Vicodin and is using only ES Tylenol for pain.  Review of Systems  Constitutional: Negative.   Respiratory: Negative.   Cardiovascular: Negative.   Neurological: Negative.        Objective:   Physical Exam  Constitutional: She is oriented to person, place, and time.  Alert but frail and very weak. She cannot walk even with assistance so she is on a wheelchair.   Cardiovascular: Normal rate, regular rhythm, normal heart sounds and intact distal pulses.   Pulmonary/Chest: Effort normal and breath sounds normal.  Musculoskeletal:  She is tender in the right groin and has pain on any manipulations of the right hip.   Neurological: She is alert and oriented to person, place, and time. No cranial nerve deficit. She exhibits normal muscle tone.          Assessment & Plan:  We will get labs today to check her renal function. Send for Xrays of the pelvis and both hips, since she may have a right hip fracxture. We will consider reordering home PT to come and help her with strengthening and balance.

## 2014-11-02 NOTE — Telephone Encounter (Signed)
Pt is on the schedule for today. Dr. Sarajane Jews did review this note.

## 2014-11-03 ENCOUNTER — Encounter: Payer: Self-pay | Admitting: Family Medicine

## 2014-11-03 LAB — CBC WITH DIFFERENTIAL/PLATELET
BASOS PCT: 0.6 % (ref 0.0–3.0)
Basophils Absolute: 0.1 10*3/uL (ref 0.0–0.1)
EOS ABS: 0.1 10*3/uL (ref 0.0–0.7)
Eosinophils Relative: 1.4 % (ref 0.0–5.0)
HCT: 32.3 % — ABNORMAL LOW (ref 36.0–46.0)
HEMOGLOBIN: 10.9 g/dL — AB (ref 12.0–15.0)
LYMPHS ABS: 1.6 10*3/uL (ref 0.7–4.0)
Lymphocytes Relative: 17.3 % (ref 12.0–46.0)
MCHC: 33.8 g/dL (ref 30.0–36.0)
MCV: 86 fl (ref 78.0–100.0)
MONO ABS: 0.7 10*3/uL (ref 0.1–1.0)
Monocytes Relative: 7.9 % (ref 3.0–12.0)
NEUTROS ABS: 6.8 10*3/uL (ref 1.4–7.7)
Neutrophils Relative %: 72.8 % (ref 43.0–77.0)
Platelets: 303 10*3/uL (ref 150.0–400.0)
RBC: 3.76 Mil/uL — ABNORMAL LOW (ref 3.87–5.11)
RDW: 16 % — ABNORMAL HIGH (ref 11.5–15.5)
WBC: 9.3 10*3/uL (ref 4.0–10.5)

## 2014-11-03 LAB — BASIC METABOLIC PANEL
BUN: 20 mg/dL (ref 6–23)
CALCIUM: 9.5 mg/dL (ref 8.4–10.5)
CHLORIDE: 96 meq/L (ref 96–112)
CO2: 29 mEq/L (ref 19–32)
Creatinine, Ser: 1.06 mg/dL (ref 0.40–1.20)
GFR: 53.24 mL/min — ABNORMAL LOW (ref >60.00)
Glucose, Bld: 142 mg/dL — ABNORMAL HIGH (ref 70–99)
POTASSIUM: 4.2 meq/L (ref 3.5–5.1)
Sodium: 132 mEq/L — ABNORMAL LOW (ref 135–145)

## 2014-11-03 NOTE — Telephone Encounter (Signed)
I think at this point Carla Friedman is not able to be independent enough to live in an assisted living setting. She needs more full time nursing care. I also advise referring her to Orthopedics for the hip pain if they agree

## 2014-11-04 LAB — POCT URINALYSIS DIPSTICK
Bilirubin, UA: NEGATIVE
Blood, UA: NEGATIVE
GLUCOSE UA: NEGATIVE
Ketones, UA: NEGATIVE
Leukocytes, UA: NEGATIVE
NITRITE UA: NEGATIVE
Protein, UA: NEGATIVE
Spec Grav, UA: 1.015
Urobilinogen, UA: 0.2
pH, UA: 7

## 2014-11-04 NOTE — Telephone Encounter (Signed)
I will be happy to fill out an FL2 form. Does she want me to refer her to Orthopedics?

## 2014-11-05 ENCOUNTER — Encounter: Payer: Self-pay | Admitting: Family Medicine

## 2014-11-06 ENCOUNTER — Encounter: Payer: Self-pay | Admitting: Family Medicine

## 2014-11-06 NOTE — Telephone Encounter (Signed)
We do not need to do anything different on our end for applying to a Medicaid facility. The FL2 form is ready. Please consult Home Health for a PT consult for gait instability and frequent falls.

## 2014-11-09 ENCOUNTER — Ambulatory Visit (INDEPENDENT_AMBULATORY_CARE_PROVIDER_SITE_OTHER): Payer: Medicare Other | Admitting: Family Medicine

## 2014-11-09 ENCOUNTER — Ambulatory Visit: Payer: Medicare Other | Admitting: Family Medicine

## 2014-11-09 ENCOUNTER — Encounter: Payer: Self-pay | Admitting: Family Medicine

## 2014-11-09 VITALS — BP 127/70 | HR 90 | Temp 97.7°F

## 2014-11-09 DIAGNOSIS — I10 Essential (primary) hypertension: Secondary | ICD-10-CM | POA: Diagnosis not present

## 2014-11-09 DIAGNOSIS — R296 Repeated falls: Secondary | ICD-10-CM

## 2014-11-09 DIAGNOSIS — E119 Type 2 diabetes mellitus without complications: Secondary | ICD-10-CM | POA: Diagnosis not present

## 2014-11-09 DIAGNOSIS — F0391 Unspecified dementia with behavioral disturbance: Secondary | ICD-10-CM | POA: Diagnosis not present

## 2014-11-09 DIAGNOSIS — Z9181 History of falling: Secondary | ICD-10-CM | POA: Insufficient documentation

## 2014-11-09 DIAGNOSIS — I2510 Atherosclerotic heart disease of native coronary artery without angina pectoris: Secondary | ICD-10-CM | POA: Diagnosis not present

## 2014-11-09 DIAGNOSIS — Z23 Encounter for immunization: Secondary | ICD-10-CM

## 2014-11-09 DIAGNOSIS — F0390 Unspecified dementia without behavioral disturbance: Secondary | ICD-10-CM | POA: Insufficient documentation

## 2014-11-09 MED ORDER — SULFAMETHOXAZOLE-TRIMETHOPRIM 800-160 MG PO TABS: 1.0000 | ORAL_TABLET | Freq: Two times a day (BID) | ORAL | Status: DC

## 2014-11-09 NOTE — Progress Notes (Signed)
Pre visit review using our clinic review tool, if applicable. No additional management support is needed unless otherwise documented below in the visit note. Decline weight

## 2014-11-09 NOTE — Progress Notes (Signed)
   Subjective:    Patient ID: Carla Friedman, female    DOB: 11-12-1933, 81 y.o.   MRN: [id number]  HPI Here with both of her daughters to discuss what the best way might be to get her into a skilled nursing facility. Also she has been very confused the past few days, and her daughter thinks she may have another UTI. Her urine has been very dark for several days. No fevers. She drinks plenty of water. No URI symptoms or fever. She tried to give [place] a urine sample to analyze today but she could not produce a sample. She has been very confused for several days and has been hallucinating. She speaks about talking to her dead son and her dead husband, etc. She did have extensive labs done just a week or two ago and these were unremarkable. Her daughter has been talking to multiple nursing facilities lately with her diagnosis being primarily CAD, but none of these facilities have any openings now. They do have openings for memory care units however.    Review of Systems  HENT: Negative.   Eyes: Negative.   Respiratory: Negative.   Cardiovascular: Negative.   Gastrointestinal: Negative.   Genitourinary: Positive for urgency and frequency. Negative for dysuria and flank pain.  Neurological: Positive for weakness.       Objective:   Physical Exam  Constitutional: She is oriented to person, place, and time.  Lethargic, in a wheelchair   Cardiovascular: Normal rate, regular rhythm, normal heart sounds and intact distal pulses.   Pulmonary/Chest: Effort normal and breath sounds normal.  Abdominal: Soft. Bowel sounds are normal. She exhibits no distension and no mass. There is no tenderness. There is no rebound and no guarding.  Neurological: She is oriented to person, place, and time.  Psychiatric: She has a normal mood and affect. Her behavior is normal. Thought content normal.          Assessment & Plan:  In case she has a UTI we will treat her with Bactrim DS for 7 days. She definitely is  showing signs of dementia and she has been much more confused than usual, so we will apply for a memory care unit facility. The Gabapentin she is taking does not seem to be helping much with her neuropathy and it can be sedating, so we will stop this today.

## 2014-11-11 DIAGNOSIS — R296 Repeated falls: Secondary | ICD-10-CM | POA: Diagnosis not present

## 2014-11-11 DIAGNOSIS — E119 Type 2 diabetes mellitus without complications: Secondary | ICD-10-CM | POA: Diagnosis not present

## 2014-11-11 DIAGNOSIS — Z9981 Dependence on supplemental oxygen: Secondary | ICD-10-CM | POA: Diagnosis not present

## 2014-11-11 DIAGNOSIS — I2510 Atherosclerotic heart disease of native coronary artery without angina pectoris: Secondary | ICD-10-CM | POA: Diagnosis not present

## 2014-11-11 DIAGNOSIS — R2689 Other abnormalities of gait and mobility: Secondary | ICD-10-CM | POA: Diagnosis not present

## 2014-11-11 DIAGNOSIS — M25551 Pain in right hip: Secondary | ICD-10-CM | POA: Diagnosis not present

## 2014-11-11 LAB — TB SKIN TEST
INDURATION: 0 mm
TB Skin Test: NEGATIVE

## 2014-11-13 ENCOUNTER — Other Ambulatory Visit: Payer: Self-pay | Admitting: *Deleted

## 2014-11-13 DIAGNOSIS — E119 Type 2 diabetes mellitus without complications: Secondary | ICD-10-CM | POA: Diagnosis not present

## 2014-11-13 DIAGNOSIS — I2510 Atherosclerotic heart disease of native coronary artery without angina pectoris: Secondary | ICD-10-CM | POA: Diagnosis not present

## 2014-11-13 DIAGNOSIS — R296 Repeated falls: Secondary | ICD-10-CM | POA: Diagnosis not present

## 2014-11-13 DIAGNOSIS — R2689 Other abnormalities of gait and mobility: Secondary | ICD-10-CM | POA: Diagnosis not present

## 2014-11-13 DIAGNOSIS — Z9981 Dependence on supplemental oxygen: Secondary | ICD-10-CM | POA: Diagnosis not present

## 2014-11-13 DIAGNOSIS — M25551 Pain in right hip: Secondary | ICD-10-CM | POA: Diagnosis not present

## 2014-11-13 MED ORDER — METFORMIN HCL 1000 MG PO TABS: 1000.0000 mg | ORAL_TABLET | Freq: Every day | ORAL | Status: DC

## 2014-11-13 MED ORDER — RAMIPRIL 10 MG PO CAPS: 10.0000 mg | ORAL_CAPSULE | Freq: Every day | ORAL | Status: DC

## 2014-11-16 DIAGNOSIS — Z9981 Dependence on supplemental oxygen: Secondary | ICD-10-CM | POA: Diagnosis not present

## 2014-11-16 DIAGNOSIS — R2689 Other abnormalities of gait and mobility: Secondary | ICD-10-CM | POA: Diagnosis not present

## 2014-11-16 DIAGNOSIS — M25551 Pain in right hip: Secondary | ICD-10-CM | POA: Diagnosis not present

## 2014-11-16 DIAGNOSIS — R296 Repeated falls: Secondary | ICD-10-CM | POA: Diagnosis not present

## 2014-11-16 DIAGNOSIS — I2510 Atherosclerotic heart disease of native coronary artery without angina pectoris: Secondary | ICD-10-CM | POA: Diagnosis not present

## 2014-11-16 DIAGNOSIS — E119 Type 2 diabetes mellitus without complications: Secondary | ICD-10-CM | POA: Diagnosis not present

## 2014-11-17 ENCOUNTER — Encounter: Payer: Self-pay | Admitting: Family Medicine

## 2014-11-17 ENCOUNTER — Telehealth: Payer: Self-pay | Admitting: Family Medicine

## 2014-11-17 NOTE — Telephone Encounter (Signed)
Joann physical therapist  needs verbal order starting on 4-20 for twice a wk  For 1 wk and three times a wk for 2 wks. And then twice a wk for 4 wks for physical therapy. Arville Go also would like order for OT eval.

## 2014-11-17 NOTE — Telephone Encounter (Signed)
Per Dr. Sarajane Jews okay to give verbal orders. I spoke with Joann and gave verbal orders.

## 2014-11-18 DIAGNOSIS — Z9981 Dependence on supplemental oxygen: Secondary | ICD-10-CM | POA: Diagnosis not present

## 2014-11-18 DIAGNOSIS — R2689 Other abnormalities of gait and mobility: Secondary | ICD-10-CM | POA: Diagnosis not present

## 2014-11-18 DIAGNOSIS — M25551 Pain in right hip: Secondary | ICD-10-CM | POA: Diagnosis not present

## 2014-11-18 DIAGNOSIS — I2510 Atherosclerotic heart disease of native coronary artery without angina pectoris: Secondary | ICD-10-CM | POA: Diagnosis not present

## 2014-11-18 DIAGNOSIS — R296 Repeated falls: Secondary | ICD-10-CM | POA: Diagnosis not present

## 2014-11-18 DIAGNOSIS — E119 Type 2 diabetes mellitus without complications: Secondary | ICD-10-CM | POA: Diagnosis not present

## 2014-11-19 ENCOUNTER — Telehealth: Payer: Self-pay | Admitting: Family Medicine

## 2014-11-19 NOTE — Telephone Encounter (Signed)
I spoke with Mechele Claude and gave below verbal orders, also per Dr. Sarajane Jews check O2 %.

## 2014-11-19 NOTE — Telephone Encounter (Signed)
Give orders to use oxygen at 2 liters overnight when she sleeps and during the day prn SOB

## 2014-11-19 NOTE — Telephone Encounter (Signed)
Carla Friedman from [place] states patient has oxygen in her room but doesn't wear the mask.  She would like know what the patient's orders are for oxygen, if it is PRN, should it be set to 2 liters, and can she have an order to check patient's oxygen level PRN??  Ok to leave message on vm if needed.

## 2014-11-20 DIAGNOSIS — Z9981 Dependence on supplemental oxygen: Secondary | ICD-10-CM | POA: Diagnosis not present

## 2014-11-20 DIAGNOSIS — R2689 Other abnormalities of gait and mobility: Secondary | ICD-10-CM | POA: Diagnosis not present

## 2014-11-20 DIAGNOSIS — R296 Repeated falls: Secondary | ICD-10-CM | POA: Diagnosis not present

## 2014-11-20 DIAGNOSIS — E119 Type 2 diabetes mellitus without complications: Secondary | ICD-10-CM | POA: Diagnosis not present

## 2014-11-20 DIAGNOSIS — M25551 Pain in right hip: Secondary | ICD-10-CM | POA: Diagnosis not present

## 2014-11-20 DIAGNOSIS — I2510 Atherosclerotic heart disease of native coronary artery without angina pectoris: Secondary | ICD-10-CM | POA: Diagnosis not present

## 2014-11-23 DIAGNOSIS — I2510 Atherosclerotic heart disease of native coronary artery without angina pectoris: Secondary | ICD-10-CM | POA: Diagnosis not present

## 2014-11-23 DIAGNOSIS — Z9981 Dependence on supplemental oxygen: Secondary | ICD-10-CM | POA: Diagnosis not present

## 2014-11-23 DIAGNOSIS — E119 Type 2 diabetes mellitus without complications: Secondary | ICD-10-CM | POA: Diagnosis not present

## 2014-11-23 DIAGNOSIS — R2689 Other abnormalities of gait and mobility: Secondary | ICD-10-CM | POA: Diagnosis not present

## 2014-11-23 DIAGNOSIS — R296 Repeated falls: Secondary | ICD-10-CM | POA: Diagnosis not present

## 2014-11-23 DIAGNOSIS — M25551 Pain in right hip: Secondary | ICD-10-CM | POA: Diagnosis not present

## 2014-11-25 DIAGNOSIS — R296 Repeated falls: Secondary | ICD-10-CM | POA: Diagnosis not present

## 2014-11-25 DIAGNOSIS — I2510 Atherosclerotic heart disease of native coronary artery without angina pectoris: Secondary | ICD-10-CM | POA: Diagnosis not present

## 2014-11-25 DIAGNOSIS — Z9981 Dependence on supplemental oxygen: Secondary | ICD-10-CM | POA: Diagnosis not present

## 2014-11-25 DIAGNOSIS — E119 Type 2 diabetes mellitus without complications: Secondary | ICD-10-CM | POA: Diagnosis not present

## 2014-11-25 DIAGNOSIS — M25551 Pain in right hip: Secondary | ICD-10-CM | POA: Diagnosis not present

## 2014-11-25 DIAGNOSIS — R2689 Other abnormalities of gait and mobility: Secondary | ICD-10-CM | POA: Diagnosis not present

## 2014-11-27 DIAGNOSIS — M25551 Pain in right hip: Secondary | ICD-10-CM | POA: Diagnosis not present

## 2014-11-27 DIAGNOSIS — Z9981 Dependence on supplemental oxygen: Secondary | ICD-10-CM | POA: Diagnosis not present

## 2014-11-27 DIAGNOSIS — I2510 Atherosclerotic heart disease of native coronary artery without angina pectoris: Secondary | ICD-10-CM | POA: Diagnosis not present

## 2014-11-27 DIAGNOSIS — R2689 Other abnormalities of gait and mobility: Secondary | ICD-10-CM | POA: Diagnosis not present

## 2014-11-27 DIAGNOSIS — E119 Type 2 diabetes mellitus without complications: Secondary | ICD-10-CM | POA: Diagnosis not present

## 2014-11-27 DIAGNOSIS — R296 Repeated falls: Secondary | ICD-10-CM | POA: Diagnosis not present

## 2014-12-01 DIAGNOSIS — M25551 Pain in right hip: Secondary | ICD-10-CM | POA: Diagnosis not present

## 2014-12-01 DIAGNOSIS — I2510 Atherosclerotic heart disease of native coronary artery without angina pectoris: Secondary | ICD-10-CM | POA: Diagnosis not present

## 2014-12-01 DIAGNOSIS — R296 Repeated falls: Secondary | ICD-10-CM | POA: Diagnosis not present

## 2014-12-01 DIAGNOSIS — R2689 Other abnormalities of gait and mobility: Secondary | ICD-10-CM | POA: Diagnosis not present

## 2014-12-01 DIAGNOSIS — Z9981 Dependence on supplemental oxygen: Secondary | ICD-10-CM | POA: Diagnosis not present

## 2014-12-01 DIAGNOSIS — E119 Type 2 diabetes mellitus without complications: Secondary | ICD-10-CM | POA: Diagnosis not present

## 2014-12-02 DIAGNOSIS — M25551 Pain in right hip: Secondary | ICD-10-CM | POA: Diagnosis not present

## 2014-12-02 DIAGNOSIS — R2689 Other abnormalities of gait and mobility: Secondary | ICD-10-CM | POA: Diagnosis not present

## 2014-12-02 DIAGNOSIS — E119 Type 2 diabetes mellitus without complications: Secondary | ICD-10-CM | POA: Diagnosis not present

## 2014-12-02 DIAGNOSIS — Z9981 Dependence on supplemental oxygen: Secondary | ICD-10-CM | POA: Diagnosis not present

## 2014-12-02 DIAGNOSIS — I2510 Atherosclerotic heart disease of native coronary artery without angina pectoris: Secondary | ICD-10-CM | POA: Diagnosis not present

## 2014-12-02 DIAGNOSIS — R296 Repeated falls: Secondary | ICD-10-CM | POA: Diagnosis not present

## 2014-12-03 DIAGNOSIS — R2689 Other abnormalities of gait and mobility: Secondary | ICD-10-CM | POA: Diagnosis not present

## 2014-12-03 DIAGNOSIS — I2510 Atherosclerotic heart disease of native coronary artery without angina pectoris: Secondary | ICD-10-CM | POA: Diagnosis not present

## 2014-12-03 DIAGNOSIS — E119 Type 2 diabetes mellitus without complications: Secondary | ICD-10-CM | POA: Diagnosis not present

## 2014-12-03 DIAGNOSIS — Z9981 Dependence on supplemental oxygen: Secondary | ICD-10-CM | POA: Diagnosis not present

## 2014-12-03 DIAGNOSIS — R296 Repeated falls: Secondary | ICD-10-CM | POA: Diagnosis not present

## 2014-12-03 DIAGNOSIS — M25551 Pain in right hip: Secondary | ICD-10-CM | POA: Diagnosis not present

## 2014-12-04 DIAGNOSIS — I2510 Atherosclerotic heart disease of native coronary artery without angina pectoris: Secondary | ICD-10-CM | POA: Diagnosis not present

## 2014-12-04 DIAGNOSIS — Z9981 Dependence on supplemental oxygen: Secondary | ICD-10-CM | POA: Diagnosis not present

## 2014-12-04 DIAGNOSIS — R296 Repeated falls: Secondary | ICD-10-CM | POA: Diagnosis not present

## 2014-12-04 DIAGNOSIS — E119 Type 2 diabetes mellitus without complications: Secondary | ICD-10-CM | POA: Diagnosis not present

## 2014-12-04 DIAGNOSIS — R2689 Other abnormalities of gait and mobility: Secondary | ICD-10-CM | POA: Diagnosis not present

## 2014-12-04 DIAGNOSIS — M25551 Pain in right hip: Secondary | ICD-10-CM | POA: Diagnosis not present

## 2014-12-07 DIAGNOSIS — R296 Repeated falls: Secondary | ICD-10-CM | POA: Diagnosis not present

## 2014-12-07 DIAGNOSIS — Z9981 Dependence on supplemental oxygen: Secondary | ICD-10-CM | POA: Diagnosis not present

## 2014-12-07 DIAGNOSIS — E119 Type 2 diabetes mellitus without complications: Secondary | ICD-10-CM | POA: Diagnosis not present

## 2014-12-07 DIAGNOSIS — M25551 Pain in right hip: Secondary | ICD-10-CM | POA: Diagnosis not present

## 2014-12-07 DIAGNOSIS — R2689 Other abnormalities of gait and mobility: Secondary | ICD-10-CM | POA: Diagnosis not present

## 2014-12-07 DIAGNOSIS — I2510 Atherosclerotic heart disease of native coronary artery without angina pectoris: Secondary | ICD-10-CM | POA: Diagnosis not present

## 2014-12-08 DIAGNOSIS — I2510 Atherosclerotic heart disease of native coronary artery without angina pectoris: Secondary | ICD-10-CM | POA: Diagnosis not present

## 2014-12-08 DIAGNOSIS — R296 Repeated falls: Secondary | ICD-10-CM | POA: Diagnosis not present

## 2014-12-08 DIAGNOSIS — Z9981 Dependence on supplemental oxygen: Secondary | ICD-10-CM | POA: Diagnosis not present

## 2014-12-08 DIAGNOSIS — R2689 Other abnormalities of gait and mobility: Secondary | ICD-10-CM | POA: Diagnosis not present

## 2014-12-08 DIAGNOSIS — M25551 Pain in right hip: Secondary | ICD-10-CM | POA: Diagnosis not present

## 2014-12-08 DIAGNOSIS — E119 Type 2 diabetes mellitus without complications: Secondary | ICD-10-CM | POA: Diagnosis not present

## 2014-12-09 DIAGNOSIS — R296 Repeated falls: Secondary | ICD-10-CM | POA: Diagnosis not present

## 2014-12-09 DIAGNOSIS — M25551 Pain in right hip: Secondary | ICD-10-CM | POA: Diagnosis not present

## 2014-12-09 DIAGNOSIS — I2510 Atherosclerotic heart disease of native coronary artery without angina pectoris: Secondary | ICD-10-CM | POA: Diagnosis not present

## 2014-12-09 DIAGNOSIS — E119 Type 2 diabetes mellitus without complications: Secondary | ICD-10-CM | POA: Diagnosis not present

## 2014-12-09 DIAGNOSIS — R2689 Other abnormalities of gait and mobility: Secondary | ICD-10-CM | POA: Diagnosis not present

## 2014-12-09 DIAGNOSIS — Z9981 Dependence on supplemental oxygen: Secondary | ICD-10-CM | POA: Diagnosis not present

## 2014-12-10 DIAGNOSIS — I2510 Atherosclerotic heart disease of native coronary artery without angina pectoris: Secondary | ICD-10-CM | POA: Diagnosis not present

## 2014-12-10 DIAGNOSIS — M25551 Pain in right hip: Secondary | ICD-10-CM | POA: Diagnosis not present

## 2014-12-10 DIAGNOSIS — E119 Type 2 diabetes mellitus without complications: Secondary | ICD-10-CM | POA: Diagnosis not present

## 2014-12-10 DIAGNOSIS — R296 Repeated falls: Secondary | ICD-10-CM | POA: Diagnosis not present

## 2014-12-10 DIAGNOSIS — Z9981 Dependence on supplemental oxygen: Secondary | ICD-10-CM | POA: Diagnosis not present

## 2014-12-10 DIAGNOSIS — R2689 Other abnormalities of gait and mobility: Secondary | ICD-10-CM | POA: Diagnosis not present

## 2014-12-14 DIAGNOSIS — R296 Repeated falls: Secondary | ICD-10-CM | POA: Diagnosis not present

## 2014-12-14 DIAGNOSIS — M25551 Pain in right hip: Secondary | ICD-10-CM | POA: Diagnosis not present

## 2014-12-14 DIAGNOSIS — Z9981 Dependence on supplemental oxygen: Secondary | ICD-10-CM | POA: Diagnosis not present

## 2014-12-14 DIAGNOSIS — I2510 Atherosclerotic heart disease of native coronary artery without angina pectoris: Secondary | ICD-10-CM | POA: Diagnosis not present

## 2014-12-14 DIAGNOSIS — R2689 Other abnormalities of gait and mobility: Secondary | ICD-10-CM | POA: Diagnosis not present

## 2014-12-14 DIAGNOSIS — E119 Type 2 diabetes mellitus without complications: Secondary | ICD-10-CM | POA: Diagnosis not present

## 2014-12-15 DIAGNOSIS — I2510 Atherosclerotic heart disease of native coronary artery without angina pectoris: Secondary | ICD-10-CM | POA: Diagnosis not present

## 2014-12-15 DIAGNOSIS — E119 Type 2 diabetes mellitus without complications: Secondary | ICD-10-CM | POA: Diagnosis not present

## 2014-12-15 DIAGNOSIS — M25551 Pain in right hip: Secondary | ICD-10-CM | POA: Diagnosis not present

## 2014-12-15 DIAGNOSIS — R296 Repeated falls: Secondary | ICD-10-CM | POA: Diagnosis not present

## 2014-12-15 DIAGNOSIS — Z9981 Dependence on supplemental oxygen: Secondary | ICD-10-CM | POA: Diagnosis not present

## 2014-12-15 DIAGNOSIS — R2689 Other abnormalities of gait and mobility: Secondary | ICD-10-CM | POA: Diagnosis not present

## 2014-12-16 ENCOUNTER — Telehealth: Payer: Self-pay | Admitting: Family Medicine

## 2014-12-16 NOTE — Telephone Encounter (Signed)
Called and spoke to daughter. Patient has appointment to see MD Sarajane Jews Friday.

## 2014-12-16 NOTE — Telephone Encounter (Signed)
Joann from [facility] called to report that pt decline her visit today. She said still felt she had no energy after having a bout with chest pains 12/15/14 Did  take her bp 150/82 left arm  heart rate 92 not having any pain today. She does remember taking  3 nitro at some point doing the day on 12/15/14     If need to reach [place] can call [phone]

## 2014-12-17 DIAGNOSIS — I2510 Atherosclerotic heart disease of native coronary artery without angina pectoris: Secondary | ICD-10-CM | POA: Diagnosis not present

## 2014-12-17 DIAGNOSIS — R296 Repeated falls: Secondary | ICD-10-CM | POA: Diagnosis not present

## 2014-12-17 DIAGNOSIS — E119 Type 2 diabetes mellitus without complications: Secondary | ICD-10-CM | POA: Diagnosis not present

## 2014-12-17 DIAGNOSIS — M25551 Pain in right hip: Secondary | ICD-10-CM | POA: Diagnosis not present

## 2014-12-17 DIAGNOSIS — R2689 Other abnormalities of gait and mobility: Secondary | ICD-10-CM | POA: Diagnosis not present

## 2014-12-17 DIAGNOSIS — Z9981 Dependence on supplemental oxygen: Secondary | ICD-10-CM | POA: Diagnosis not present

## 2014-12-18 ENCOUNTER — Ambulatory Visit (INDEPENDENT_AMBULATORY_CARE_PROVIDER_SITE_OTHER): Payer: Medicare Other | Admitting: Family Medicine

## 2014-12-18 ENCOUNTER — Encounter: Payer: Self-pay | Admitting: Family Medicine

## 2014-12-18 VITALS — BP 123/75 | HR 103 | Temp 98.2°F | Ht 64.0 in | Wt 135.0 lb

## 2014-12-18 DIAGNOSIS — F32A Depression, unspecified: Secondary | ICD-10-CM

## 2014-12-18 DIAGNOSIS — R350 Frequency of micturition: Secondary | ICD-10-CM

## 2014-12-18 DIAGNOSIS — I2510 Atherosclerotic heart disease of native coronary artery without angina pectoris: Secondary | ICD-10-CM

## 2014-12-18 DIAGNOSIS — F0390 Unspecified dementia without behavioral disturbance: Secondary | ICD-10-CM | POA: Diagnosis not present

## 2014-12-18 DIAGNOSIS — R413 Other amnesia: Secondary | ICD-10-CM

## 2014-12-18 DIAGNOSIS — I10 Essential (primary) hypertension: Secondary | ICD-10-CM

## 2014-12-18 DIAGNOSIS — F329 Major depressive disorder, single episode, unspecified: Secondary | ICD-10-CM | POA: Diagnosis not present

## 2014-12-18 NOTE — Progress Notes (Signed)
Pre visit review using our clinic review tool, if applicable. No additional management support is needed unless otherwise documented below in the visit note. 

## 2014-12-18 NOTE — Progress Notes (Signed)
   Subjective:    Patient ID: Carla Friedman, female    DOB: Sep 20, 1933, 81 y.o.   MRN: [id number]  HPI Here with her daughter to follow up multiple issues. She had been very confused this week, and at first her daughter thought she may have had a UTI. She has been eating and drinking fluids well. No fever. Today however Carla Friedman seems to be back to her usual self and is much more alert. She tried to give [place] a urine sample today but was unable to produce any urine. She is just completing her round of OT and she has only a few more sessions of PT to go. This has been very helpful for her. Her gait is more stable, she is stronger, and she is more independent. Her daughter asks if the confusion earlier this week could have been due to strokes or TIA's.    Review of Systems  Constitutional: Positive for fatigue. Negative for fever, activity change and appetite change.  Respiratory: Negative.   Cardiovascular: Negative.   Gastrointestinal: Negative.   Endocrine: Negative.   Genitourinary: Negative.   Neurological: Negative.        Objective:   Physical Exam  Constitutional: She is oriented to person, place, and time.  She looks good today, sitting in her wheelchair, quite alert and participating in the conversation  Cardiovascular: Normal rate, regular rhythm, normal heart sounds and intact distal pulses.   Pulmonary/Chest: Effort normal and breath sounds normal.  Neurological: She is alert and oriented to person, place, and time.          Assessment & Plan:  Her confusion has resolved. I think this was not likely to be the result of an infection. Rather it was probably due to transient poor blood flow to the brain. She seems to be back at baseline today. We will stay on the same regimen.

## 2014-12-21 DIAGNOSIS — R296 Repeated falls: Secondary | ICD-10-CM | POA: Diagnosis not present

## 2014-12-21 DIAGNOSIS — Z9981 Dependence on supplemental oxygen: Secondary | ICD-10-CM | POA: Diagnosis not present

## 2014-12-21 DIAGNOSIS — R2689 Other abnormalities of gait and mobility: Secondary | ICD-10-CM | POA: Diagnosis not present

## 2014-12-21 DIAGNOSIS — E119 Type 2 diabetes mellitus without complications: Secondary | ICD-10-CM | POA: Diagnosis not present

## 2014-12-21 DIAGNOSIS — M25551 Pain in right hip: Secondary | ICD-10-CM | POA: Diagnosis not present

## 2014-12-21 DIAGNOSIS — I2510 Atherosclerotic heart disease of native coronary artery without angina pectoris: Secondary | ICD-10-CM | POA: Diagnosis not present

## 2014-12-23 NOTE — Progress Notes (Signed)
Patient ID: Carla Friedman, female   DOB: January 30, 1934, 81 y.o.   MRN: [id number] Carla Friedman is seen today for CAD, HTN, and elevated lipids. She has had CABG with stents to the native LAD and SVG to OM. She has distal disease and chronic chest pain. She actually is seen by hospice. She prefers not to be seen in hospital alot. She has less chest pain off lisinopril and on Atenolol. . Her last cath was in 11/2006 and she had a non-ischemic myovue in 11/2007. She has morphine and oxygen at home. She has sig. depression. She looked very good today I am not convinced that all of her pains are anginal but she seems satisfied to have oxygen at home.  She also complains of SOB. it does not sound like volume there is no PND, orthopnea, or edema.  She continues to be depressed. She uses oxygen and nitro as a crutch but this has helped her  over the years.  She has significant venous insuf with marked dependant discoloration but no arterial insuf.  Ranexa has helped her quite a bit.   Now at [employer]. Increasingly forgetful  Saw Dr Sarajane Jews recently with confusion   Primary started her on Namenda Has gallstones but no acute cholecystitis and would not do surgery unless absolutely have to   Memory poor may need to go to assisted living soon Chest pains at baseline    ROS: Denies fever, malais, weight loss, blurry vision, decreased visual acuity, cough, sputum, SOB, hemoptysis, pleuritic pain, palpitaitons, heartburn, abdominal pain, melena, lower extremity edema, claudication, or rash.  All other systems reviewed and negative  General: Affect appropriate Frail elderly female  HEENT:  Large echymosis over left face  Neck supple with no adenopathy JVP normal no bruits no thyromegaly Lungs clear with no wheezing and good diaphragmatic motion Heart:  S1/S2 no murmur, no rub, gallop or click PMI normal Abdomen: benighn, BS positve, no tenderness, no AAA no bruit.  No HSM or HJR Distal pulses intact with  no bruits No edema Neuro non-focal Skin warm and dry No muscular weakness   Current Outpatient Prescriptions  Medication Sig Dispense Refill  . amitriptyline (ELAVIL) 50 MG tablet Take 50 mg by mouth at bedtime.    . Ascorbic Acid (VITAMIN C) 500 MG tablet Take 500 mg by mouth daily.      Marland Kitchen aspirin 81 MG tablet Take 81 mg by mouth at bedtime.     Marland Kitchen atenolol (TENORMIN) 50 MG tablet TAKE ONE TABLET BY MOUTH EVERY DAY IN THE MORNING.  30 tablet 1  . Calcium Carbonate (CALCIUM 600 PO) Take 600 mg by mouth daily.     . Cholecalciferol (VITAMIN D) 1000 UNITS capsule Take 1,000 Units by mouth daily.      . clopidogrel (PLAVIX) 75 MG tablet TAKE ONE TABLET BY MOUTH ONE TIME DAILY  30 tablet 5  . docusate sodium 100 MG CAPS Take 100 mg by mouth daily as needed for mild constipation. (Patient taking differently: Take 100 mg by mouth every other day. ) 10 capsule 0  . isosorbide dinitrate (ISORDIL) 30 MG tablet Take 30 mg by mouth 2 (two) times daily.    . memantine (NAMENDA) 10 MG tablet Take 1 tablet (10 mg total) by mouth 2 (two) times daily. 60 tablet 5  . metFORMIN (GLUCOPHAGE) 1000 MG tablet Take 1 tablet (1,000 mg total) by mouth daily with breakfast. 30 tablet 5  . Multiple Vitamin (MULTIVITAMIN) capsule Take 1 capsule by  mouth daily.      . nitroGLYCERIN (NITROSTAT) 0.4 MG SL tablet Place 0.4 mg under the tongue every 5 (five) minutes as needed for chest pain.     Marland Kitchen ondansetron (ZOFRAN ODT) 4 MG disintegrating tablet Take 1 tablet (4 mg total) by mouth every 8 (eight) hours as needed for nausea. 6 tablet 0  . pravastatin (PRAVACHOL) 40 MG tablet TAKE ONE TABLET BY MOUTH ONE TIME DAILY  30 tablet 5  . ramipril (ALTACE) 10 MG capsule Take 1 capsule (10 mg total) by mouth daily. 30 capsule 5  . RANEXA 500 MG 12 hr tablet TAKE ONE TABLET BY MOUTH TWICE DAILY  60 tablet 5  . sulfamethoxazole-trimethoprim (BACTRIM DS,SEPTRA DS) 800-160 MG per tablet Take 1 tablet by mouth 2 (two) times daily. 14  tablet 0  . valACYclovir (VALTREX) 500 MG tablet Take 1 tablet (500 mg total) by mouth 2 (two) times daily as needed (fever blisters). 20 tablet 5   No current facility-administered medications for this visit.    Allergies  Anbesol cold sore therapy; Morphine; Other; and Codeine  Electrocardiogram:  SR rate 64  LAD no acute ST/ T wave changes 11/15  Assessment and Plan CAD:  Stable with no angina and good activity level.  Continue medical Rx Ranexa has helped  No aggressive intervention given age and advanced dementia  UTI:  On bactrim symptoms improved  Chol:  On statin labs with primary   HTN:  Well controlled.  Continue current medications and low sodium Dash type diet.    Dementia:  Advanced may need to be in memory care unit. Does not qualify for medicaid as her  Social security check is too high

## 2014-12-24 DIAGNOSIS — R296 Repeated falls: Secondary | ICD-10-CM | POA: Diagnosis not present

## 2014-12-24 DIAGNOSIS — E119 Type 2 diabetes mellitus without complications: Secondary | ICD-10-CM | POA: Diagnosis not present

## 2014-12-24 DIAGNOSIS — M25551 Pain in right hip: Secondary | ICD-10-CM | POA: Diagnosis not present

## 2014-12-24 DIAGNOSIS — Z9981 Dependence on supplemental oxygen: Secondary | ICD-10-CM | POA: Diagnosis not present

## 2014-12-24 DIAGNOSIS — I2510 Atherosclerotic heart disease of native coronary artery without angina pectoris: Secondary | ICD-10-CM | POA: Diagnosis not present

## 2014-12-24 DIAGNOSIS — R2689 Other abnormalities of gait and mobility: Secondary | ICD-10-CM | POA: Diagnosis not present

## 2014-12-25 ENCOUNTER — Telehealth: Payer: Self-pay | Admitting: Family Medicine

## 2014-12-25 DIAGNOSIS — R2689 Other abnormalities of gait and mobility: Secondary | ICD-10-CM | POA: Diagnosis not present

## 2014-12-25 MED ORDER — SULFAMETHOXAZOLE-TRIMETHOPRIM 800-160 MG PO TABS: 1.0000 | ORAL_TABLET | Freq: Two times a day (BID) | ORAL | Status: DC

## 2014-12-25 NOTE — Telephone Encounter (Signed)
Daughter call to say she could not bring in urine sample until next week. She call today to say her Mom is having burning  when urinating . Daughter said she think she has bladder infection/UTI  and is asking if something can be called in.     [place]

## 2014-12-25 NOTE — Addendum Note (Signed)
Addended by: Aggie Hacker A on: 12/25/2014 10:47 AM   Modules accepted: Orders

## 2014-12-25 NOTE — Telephone Encounter (Signed)
I sent script e-scribe and spoke with daughter Drue Dun.

## 2014-12-25 NOTE — Telephone Encounter (Signed)
Call in Bactrim DS bid for 7 days  

## 2014-12-28 ENCOUNTER — Ambulatory Visit (INDEPENDENT_AMBULATORY_CARE_PROVIDER_SITE_OTHER): Payer: Medicare Other | Admitting: Cardiovascular Disease

## 2014-12-28 ENCOUNTER — Encounter: Payer: Self-pay | Admitting: Cardiovascular Disease

## 2014-12-28 VITALS — BP 150/80 | HR 76 | Ht 64.0 in | Wt 136.1 lb

## 2014-12-28 DIAGNOSIS — I208 Other forms of angina pectoris: Secondary | ICD-10-CM

## 2014-12-28 NOTE — Patient Instructions (Signed)
Medication Instructions:  NO CHANGES  Labwork: NONE  Testing/Procedures: NONE  Follow-Up: Your physician wants you to follow-up in: 6 MONTHS WITH DR NISHAN You will receive a reminder letter in the mail two months in advance. If you don't receive a letter, please call our office to schedule the follow-up appointment.  Any Other Special Instructions Will Be Listed Below (If Applicable).   

## 2015-01-06 ENCOUNTER — Other Ambulatory Visit: Payer: Self-pay | Admitting: Cardiovascular Disease

## 2015-01-18 ENCOUNTER — Other Ambulatory Visit: Payer: Self-pay

## 2015-02-02 DIAGNOSIS — D045 Carcinoma in situ of skin of trunk: Secondary | ICD-10-CM | POA: Diagnosis not present

## 2015-02-02 DIAGNOSIS — Z85828 Personal history of other malignant neoplasm of skin: Secondary | ICD-10-CM | POA: Diagnosis not present

## 2015-02-02 DIAGNOSIS — D485 Neoplasm of uncertain behavior of skin: Secondary | ICD-10-CM | POA: Diagnosis not present

## 2015-02-02 DIAGNOSIS — L570 Actinic keratosis: Secondary | ICD-10-CM | POA: Diagnosis not present

## 2015-02-02 DIAGNOSIS — D0462 Carcinoma in situ of skin of left upper limb, including shoulder: Secondary | ICD-10-CM | POA: Diagnosis not present

## 2015-02-02 DIAGNOSIS — L821 Other seborrheic keratosis: Secondary | ICD-10-CM | POA: Diagnosis not present

## 2015-02-02 DIAGNOSIS — D0461 Carcinoma in situ of skin of right upper limb, including shoulder: Secondary | ICD-10-CM | POA: Diagnosis not present

## 2015-02-02 DIAGNOSIS — C44529 Squamous cell carcinoma of skin of other part of trunk: Secondary | ICD-10-CM | POA: Diagnosis not present

## 2015-02-02 DIAGNOSIS — D0472 Carcinoma in situ of skin of left lower limb, including hip: Secondary | ICD-10-CM | POA: Diagnosis not present

## 2015-02-08 ENCOUNTER — Other Ambulatory Visit: Payer: Self-pay | Admitting: Family Medicine

## 2015-02-11 NOTE — Telephone Encounter (Signed)
Pt's daughter called stating that her mother is completely out of the medicine and won't be able to sleep without it. They were wondering if they could put a rush on this and send the rx to the pharmacy. Please advise.

## 2015-02-21 ENCOUNTER — Other Ambulatory Visit: Payer: Self-pay | Admitting: Family Medicine

## 2015-03-03 DIAGNOSIS — Z87891 Personal history of nicotine dependence: Secondary | ICD-10-CM | POA: Diagnosis not present

## 2015-03-03 DIAGNOSIS — D045 Carcinoma in situ of skin of trunk: Secondary | ICD-10-CM | POA: Diagnosis not present

## 2015-03-03 DIAGNOSIS — D0461 Carcinoma in situ of skin of right upper limb, including shoulder: Secondary | ICD-10-CM | POA: Diagnosis not present

## 2015-03-03 DIAGNOSIS — D0472 Carcinoma in situ of skin of left lower limb, including hip: Secondary | ICD-10-CM | POA: Diagnosis not present

## 2015-03-03 DIAGNOSIS — C44529 Squamous cell carcinoma of skin of other part of trunk: Secondary | ICD-10-CM | POA: Diagnosis not present

## 2015-03-03 DIAGNOSIS — D0462 Carcinoma in situ of skin of left upper limb, including shoulder: Secondary | ICD-10-CM | POA: Diagnosis not present

## 2015-03-04 ENCOUNTER — Encounter: Payer: Self-pay | Admitting: Family Medicine

## 2015-03-09 ENCOUNTER — Encounter: Payer: Self-pay | Admitting: Family Medicine

## 2015-03-09 NOTE — Telephone Encounter (Signed)
Duplicate note

## 2015-03-09 NOTE — Telephone Encounter (Signed)
We will be happy to fill out a new FL2 form, but she needs to provide this to [place]. We do not have these on hand

## 2015-03-17 ENCOUNTER — Other Ambulatory Visit: Payer: Self-pay | Admitting: Cardiovascular Disease

## 2015-03-17 DIAGNOSIS — C44529 Squamous cell carcinoma of skin of other part of trunk: Secondary | ICD-10-CM | POA: Diagnosis not present

## 2015-03-17 DIAGNOSIS — Z483 Aftercare following surgery for neoplasm: Secondary | ICD-10-CM | POA: Diagnosis not present

## 2015-03-17 DIAGNOSIS — D0462 Carcinoma in situ of skin of left upper limb, including shoulder: Secondary | ICD-10-CM | POA: Diagnosis not present

## 2015-03-17 DIAGNOSIS — D0461 Carcinoma in situ of skin of right upper limb, including shoulder: Secondary | ICD-10-CM | POA: Diagnosis not present

## 2015-03-17 DIAGNOSIS — D045 Carcinoma in situ of skin of trunk: Secondary | ICD-10-CM | POA: Diagnosis not present

## 2015-03-17 DIAGNOSIS — Z4802 Encounter for removal of sutures: Secondary | ICD-10-CM | POA: Diagnosis not present

## 2015-03-17 DIAGNOSIS — D0472 Carcinoma in situ of skin of left lower limb, including hip: Secondary | ICD-10-CM | POA: Diagnosis not present

## 2015-04-01 ENCOUNTER — Other Ambulatory Visit: Payer: Self-pay | Admitting: Cardiovascular Disease

## 2015-04-02 NOTE — Telephone Encounter (Signed)
Lipids done by pcp per last office visit. Ok to refill?

## 2015-05-03 ENCOUNTER — Other Ambulatory Visit: Payer: Self-pay | Admitting: Cardiovascular Disease

## 2015-05-10 ENCOUNTER — Ambulatory Visit (INDEPENDENT_AMBULATORY_CARE_PROVIDER_SITE_OTHER): Payer: Medicare Other | Admitting: Family Medicine

## 2015-05-10 ENCOUNTER — Encounter: Payer: Self-pay | Admitting: Family Medicine

## 2015-05-10 VITALS — BP 165/80 | HR 95 | Temp 98.9°F | Ht 64.0 in | Wt 141.0 lb

## 2015-05-10 DIAGNOSIS — N390 Urinary tract infection, site not specified: Secondary | ICD-10-CM

## 2015-05-10 DIAGNOSIS — I208 Other forms of angina pectoris: Secondary | ICD-10-CM | POA: Diagnosis not present

## 2015-05-10 DIAGNOSIS — Z23 Encounter for immunization: Secondary | ICD-10-CM

## 2015-05-10 MED ORDER — SULFAMETHOXAZOLE-TRIMETHOPRIM 800-160 MG PO TABS: 1.0000 | ORAL_TABLET | Freq: Two times a day (BID) | ORAL | Status: DC

## 2015-05-10 NOTE — Addendum Note (Signed)
Addended by: Aggie Hacker A on: 05/10/2015 05:05 PM   Modules accepted: Orders

## 2015-05-10 NOTE — Progress Notes (Signed)
   Subjective:    Patient ID: Carla Friedman, female    DOB: [birth year], 81 y.o.   MRN: [id number]  HPI Here with her daughter for 3 days of urgency to urinate with burning.  No fever. Good fluid intake. She is unable to provide a urine sample today.    Review of Systems  Constitutional: Negative.   Respiratory: Negative.   Cardiovascular: Negative.   Gastrointestinal: Negative.   Genitourinary: Positive for dysuria, urgency and frequency. Negative for flank pain.       Objective:   Physical Exam  Constitutional: She appears well-developed and well-nourished. No distress.  Cardiovascular: Normal rate, regular rhythm, normal heart sounds and intact distal pulses.   Pulmonary/Chest: Effort normal and breath sounds normal.  Abdominal: Soft. Bowel sounds are normal. She exhibits no distension and no mass. There is no tenderness. There is no rebound and no guarding.          Assessment & Plan:  UTI. Treat with Bactrim DS.

## 2015-05-13 ENCOUNTER — Other Ambulatory Visit: Payer: Self-pay | Admitting: Family Medicine

## 2015-06-02 ENCOUNTER — Other Ambulatory Visit: Payer: Self-pay | Admitting: Family Medicine

## 2015-06-02 ENCOUNTER — Other Ambulatory Visit: Payer: Self-pay

## 2015-06-02 MED ORDER — CLOPIDOGREL BISULFATE 75 MG PO TABS: 75.0000 mg | ORAL_TABLET | Freq: Every day | ORAL | Status: DC

## 2015-06-02 MED ORDER — ISOSORBIDE DINITRATE 30 MG PO TABS: 30.0000 mg | ORAL_TABLET | Freq: Two times a day (BID) | ORAL | Status: DC

## 2015-06-02 MED ORDER — ATENOLOL 50 MG PO TABS: 50.0000 mg | ORAL_TABLET | Freq: Every morning | ORAL | Status: DC

## 2015-06-02 MED ORDER — PRAVASTATIN SODIUM 40 MG PO TABS: 40.0000 mg | ORAL_TABLET | Freq: Every day | ORAL | Status: DC

## 2015-06-02 MED ORDER — RAMIPRIL 10 MG PO CAPS: 10.0000 mg | ORAL_CAPSULE | Freq: Every day | ORAL | Status: DC

## 2015-06-04 ENCOUNTER — Other Ambulatory Visit: Payer: Self-pay | Admitting: *Deleted

## 2015-06-04 MED ORDER — CLOPIDOGREL BISULFATE 75 MG PO TABS: 75.0000 mg | ORAL_TABLET | Freq: Every day | ORAL | Status: DC

## 2015-06-04 MED ORDER — PRAVASTATIN SODIUM 40 MG PO TABS: 40.0000 mg | ORAL_TABLET | Freq: Every day | ORAL | Status: DC

## 2015-06-04 MED ORDER — RAMIPRIL 10 MG PO CAPS: 10.0000 mg | ORAL_CAPSULE | Freq: Every day | ORAL | Status: DC

## 2015-06-04 MED ORDER — ISOSORBIDE DINITRATE 30 MG PO TABS: 30.0000 mg | ORAL_TABLET | Freq: Two times a day (BID) | ORAL | Status: DC

## 2015-06-04 MED ORDER — MEMANTINE HCL 10 MG PO TABS: 10.0000 mg | ORAL_TABLET | Freq: Two times a day (BID) | ORAL | Status: DC

## 2015-06-04 MED ORDER — METFORMIN HCL 1000 MG PO TABS: ORAL_TABLET | ORAL | Status: DC

## 2015-06-04 MED ORDER — ATENOLOL 50 MG PO TABS: 50.0000 mg | ORAL_TABLET | Freq: Every morning | ORAL | Status: DC

## 2015-06-09 ENCOUNTER — Telehealth: Payer: Self-pay | Admitting: Family Medicine

## 2015-06-09 ENCOUNTER — Other Ambulatory Visit: Payer: Self-pay | Admitting: Family Medicine

## 2015-06-09 ENCOUNTER — Encounter: Payer: Self-pay | Admitting: Family Medicine

## 2015-06-09 NOTE — Telephone Encounter (Signed)
Pt needs ramipriil send to [facility] [address] not [facility]

## 2015-06-11 MED ORDER — RAMIPRIL 10 MG PO CAPS: ORAL_CAPSULE | ORAL | Status: DC

## 2015-06-11 NOTE — Telephone Encounter (Signed)
This is a duplicate note, see previous one.  

## 2015-06-11 NOTE — Telephone Encounter (Signed)
Rx sent to [facility]

## 2015-06-28 NOTE — Progress Notes (Signed)
Patient ID: Carla Friedman, female   DOB: [birth year], 81 y.o.   MRN: [id number]   Carla Friedman is seen today for CAD, HTN, and elevated lipids. She has had CABG with stents to the native LAD and SVG to OM. She has distal disease and chronic chest pain. She actually is seen by hospice. She prefers not to be seen in hospital alot. She has less chest pain off lisinopril and on Atenolol. . Her last cath was in 11/2006 and she had a non-ischemic myovue in 11/2007. She has morphine and oxygen at home. She has sig. depression. She looked very good today I am not convinced that all of her pains are anginal but she seems satisfied to have oxygen at home.  She also complains of SOB. it does not sound like volume there is no PND, orthopnea, or edema.  She continues to be depressed. She uses oxygen and nitro as a crutch but this has helped her  over the years.  She has significant venous insuf with marked dependant discoloration but no arterial insuf.  Ranexa has helped her quite a bit.   Now at [employer]. Increasingly forgetful  Saw Dr Sarajane Jews recently with confusion   Primary started her on Namenda Has gallstones but no acute cholecystitis and would not do surgery unless absolutely have to   Memory is worse on namenda Chest pains at baseline  Rannexa cut back to daily in donut hole   ROS: Denies fever, malais, weight loss, blurry vision, decreased visual acuity, cough, sputum, SOB, hemoptysis, pleuritic pain, palpitaitons, heartburn, abdominal pain, melena, lower extremity edema, claudication, or rash.  All other systems reviewed and negative  General: Affect appropriate Frail elderly female  HEENT:  Large echymosis over left face  Neck supple with no adenopathy JVP normal no bruits no thyromegaly Lungs clear with no wheezing and good diaphragmatic motion Heart:  S1/S2 no murmur, no rub, gallop or click PMI normal Abdomen: benighn, BS positve, no tenderness, no AAA no bruit.  No HSM or HJR Distal  pulses intact with no bruits No edema Neuro non-focal Skin warm and dry No muscular weakness   Current Outpatient Prescriptions  Medication Sig Dispense Refill  . amitriptyline (ELAVIL) 50 MG tablet TAKE ONE TABLET BY MOUTH AT BEDTIME 30 tablet 11  . Ascorbic Acid (VITAMIN C) 500 MG tablet Take 500 mg by mouth daily.      Marland Kitchen aspirin 81 MG tablet Take 81 mg by mouth at bedtime.     Marland Kitchen atenolol (TENORMIN) 50 MG tablet Take 1 tablet (50 mg total) by mouth every morning. 90 tablet 1  . Calcium Carbonate (CALCIUM 600 PO) Take 600 mg by mouth daily.     . Cholecalciferol (VITAMIN D) 1000 UNITS capsule Take 1,000 Units by mouth daily.      . clopidogrel (PLAVIX) 75 MG tablet Take 1 tablet (75 mg total) by mouth daily. 90 tablet 1  . docusate sodium 100 MG CAPS Take 100 mg by mouth daily as needed for mild constipation. (Patient taking differently: Take 100 mg by mouth every other day. ) 10 capsule 0  . isosorbide dinitrate (ISORDIL) 30 MG tablet Take 1 tablet (30 mg total) by mouth 2 (two) times daily. 180 tablet 1  . memantine (NAMENDA) 10 MG tablet Take 1 tablet (10 mg total) by mouth 2 (two) times daily. 180 tablet 1  . metFORMIN (GLUCOPHAGE) 1000 MG tablet TAKE ONE TABLET BY MOUTH ONCE DAILY WITH BREAKFAST 90 tablet 1  . Multiple  Vitamin (MULTIVITAMIN) capsule Take 1 capsule by mouth daily.      . nitroGLYCERIN (NITROSTAT) 0.4 MG SL tablet Place 0.4 mg under the tongue every 5 (five) minutes as needed for chest pain (3 doses max).     . ondansetron (ZOFRAN ODT) 4 MG disintegrating tablet Take 1 tablet (4 mg total) by mouth every 8 (eight) hours as needed for nausea. 6 tablet 0  . pravastatin (PRAVACHOL) 40 MG tablet Take 1 tablet (40 mg total) by mouth daily. 90 tablet 1  . ramipril (ALTACE) 10 MG capsule Take 1 capsule (10 mg total) by mouth daily. 90 capsule 1  . valACYclovir (VALTREX) 500 MG tablet Take 1 tablet (500 mg total) by mouth 2 (two) times daily as needed (fever blisters). 20 tablet  5  . ranolazine (RANEXA) 500 MG 12 hr tablet Take 1 tablet (500 mg total) by mouth 2 (two) times daily. 60 tablet 11   No current facility-administered medications for this visit.    Allergies  Anbesol cold sore therapy; Morphine; Other; and Codeine  Electrocardiogram:  SR rate 64  LAD no acute ST/ T wave changes 11/15  07/01/15  SR rate 67 LAD nonspecific ST changes   Assessment and Plan CAD:  Stable with no angina and good activity level.  Continue medical Rx Ranexa has helped  No aggressive intervention given age and advanced dementia  UTI:  On bactrim symptoms improved  Chol:  On statin labs with primary   HTN:  Well controlled.  Continue current medications and low sodium Dash type diet.    Dementia:  Advanced may need to be in memory care unit. Does not qualify for medicaid as her  Social security check is too high Stop Namenda and f/u with Dr Deliah Goody

## 2015-07-01 ENCOUNTER — Encounter: Payer: Self-pay | Admitting: Cardiovascular Disease

## 2015-07-01 ENCOUNTER — Ambulatory Visit (INDEPENDENT_AMBULATORY_CARE_PROVIDER_SITE_OTHER): Payer: Medicare Other | Admitting: Cardiovascular Disease

## 2015-07-01 VITALS — BP 160/74 | HR 67 | Ht 62.0 in | Wt 146.6 lb

## 2015-07-01 DIAGNOSIS — I2510 Atherosclerotic heart disease of native coronary artery without angina pectoris: Secondary | ICD-10-CM | POA: Diagnosis not present

## 2015-07-01 DIAGNOSIS — I208 Other forms of angina pectoris: Secondary | ICD-10-CM | POA: Diagnosis not present

## 2015-07-01 DIAGNOSIS — I10 Essential (primary) hypertension: Secondary | ICD-10-CM | POA: Diagnosis not present

## 2015-07-01 MED ORDER — RANOLAZINE ER 500 MG PO TB12: 500.0000 mg | ORAL_TABLET | Freq: Two times a day (BID) | ORAL | Status: DC

## 2015-07-01 NOTE — Patient Instructions (Signed)

## 2015-08-06 ENCOUNTER — Other Ambulatory Visit: Payer: Self-pay | Admitting: Cardiovascular Disease

## 2015-08-23 ENCOUNTER — Other Ambulatory Visit: Payer: Self-pay | Admitting: Family Medicine

## 2015-08-23 MED ORDER — AMITRIPTYLINE HCL 50 MG PO TABS: 50.0000 mg | ORAL_TABLET | Freq: Every day | ORAL | Status: AC

## 2015-08-23 MED ORDER — RAMIPRIL 10 MG PO CAPS: 10.0000 mg | ORAL_CAPSULE | Freq: Every day | ORAL | Status: DC

## 2015-08-23 MED ORDER — METFORMIN HCL 1000 MG PO TABS: ORAL_TABLET | ORAL | Status: AC

## 2015-08-25 ENCOUNTER — Encounter: Payer: Self-pay | Admitting: Family Medicine

## 2015-08-26 ENCOUNTER — Other Ambulatory Visit: Payer: Self-pay | Admitting: Family Medicine

## 2015-08-26 MED ORDER — ATENOLOL 50 MG PO TABS: 50.0000 mg | ORAL_TABLET | Freq: Every morning | ORAL | Status: DC

## 2015-08-27 ENCOUNTER — Encounter: Payer: Self-pay | Admitting: Family Medicine

## 2015-08-27 NOTE — Telephone Encounter (Signed)
Make an OV for us to evaluate this  

## 2015-08-31 ENCOUNTER — Encounter: Payer: Self-pay | Admitting: Family Medicine

## 2015-08-31 ENCOUNTER — Ambulatory Visit (INDEPENDENT_AMBULATORY_CARE_PROVIDER_SITE_OTHER): Payer: Medicare Other | Admitting: Family Medicine

## 2015-08-31 VITALS — BP 150/78 | HR 70 | Temp 98.2°F | Ht 62.0 in | Wt 146.0 lb

## 2015-08-31 DIAGNOSIS — E119 Type 2 diabetes mellitus without complications: Secondary | ICD-10-CM

## 2015-08-31 DIAGNOSIS — R3981 Functional urinary incontinence: Secondary | ICD-10-CM

## 2015-08-31 DIAGNOSIS — F329 Major depressive disorder, single episode, unspecified: Secondary | ICD-10-CM

## 2015-08-31 DIAGNOSIS — F0390 Unspecified dementia without behavioral disturbance: Secondary | ICD-10-CM | POA: Diagnosis not present

## 2015-08-31 DIAGNOSIS — F32A Depression, unspecified: Secondary | ICD-10-CM

## 2015-08-31 DIAGNOSIS — I2510 Atherosclerotic heart disease of native coronary artery without angina pectoris: Secondary | ICD-10-CM

## 2015-08-31 DIAGNOSIS — R159 Full incontinence of feces: Secondary | ICD-10-CM

## 2015-08-31 DIAGNOSIS — I10 Essential (primary) hypertension: Secondary | ICD-10-CM

## 2015-08-31 MED ORDER — RANOLAZINE ER 500 MG PO TB12: 500.0000 mg | ORAL_TABLET | Freq: Every day | ORAL | Status: DC

## 2015-08-31 NOTE — Progress Notes (Signed)
   Subjective:    Patient ID: Carla Friedman, female    DOB: 09/27/1933, 82 y.o.   MRN: [id number]  HPI Here with her daughter to ask about stool and urine incontinence. This has been a problem for about a month or so. She will pass stool or urine into her undergarment with no warning and no sensation of urgency. She has stopped wearing underwear and is wearing Depends instead. She feels great in general. Her appetite is good, her weight is stable. Her dementia seems to be progressing, and her daughter relates that she is more forgetful and often forgets entire conversations that they have together. She has not checked her glucoses for a long time. Her last A1c was done about a year and a half ago.    Review of Systems  Constitutional: Negative.   Respiratory: Negative.   Cardiovascular: Negative.   Gastrointestinal: Negative for abdominal pain.  Genitourinary: Negative for dysuria, hematuria and pelvic pain.  Neurological: Negative.        Objective:   Physical Exam  Constitutional: She appears well-developed and well-nourished.  She looks great today, neatly dressed   Cardiovascular: Normal rate, regular rhythm, normal heart sounds and intact distal pulses.   Pulmonary/Chest: Effort normal and breath sounds normal.  Abdominal: Soft. Bowel sounds are normal. She exhibits no distension and no mass. There is no tenderness. There is no rebound and no guarding.          Assessment & Plan:  She is showing signs of advancing dementia, and we discussed how this can affect the urinary system and the bowel system. I don't think there is much we can do about the incontinence except wear Depends pants. We will send her for labs today including an A1c to follow her diabetes. Her HTN is stable and her depression seems to be well controlled.

## 2015-08-31 NOTE — Progress Notes (Signed)
Pre visit review using our clinic review tool, if applicable. No additional management support is needed unless otherwise documented below in the visit note. 

## 2015-09-01 ENCOUNTER — Telehealth: Payer: Self-pay | Admitting: Family Medicine

## 2015-09-01 LAB — CBC WITH DIFFERENTIAL/PLATELET
Basophils Absolute: 0.1 10*3/uL (ref 0.0–0.1)
Basophils Relative: 1.1 % (ref 0.0–3.0)
EOS PCT: 2.6 % (ref 0.0–5.0)
Eosinophils Absolute: 0.2 10*3/uL (ref 0.0–0.7)
HCT: 36.4 % (ref 36.0–46.0)
Hemoglobin: 11.8 g/dL — ABNORMAL LOW (ref 12.0–15.0)
LYMPHS ABS: 2.1 10*3/uL (ref 0.7–4.0)
Lymphocytes Relative: 27.6 % (ref 12.0–46.0)
MCHC: 32.4 g/dL (ref 30.0–36.0)
MCV: 88.8 fl (ref 78.0–100.0)
MONOS PCT: 6.5 % (ref 3.0–12.0)
Monocytes Absolute: 0.5 10*3/uL (ref 0.1–1.0)
NEUTROS ABS: 4.8 10*3/uL (ref 1.4–7.7)
NEUTROS PCT: 62.2 % (ref 43.0–77.0)
PLATELETS: 305 10*3/uL (ref 150.0–400.0)
RBC: 4.1 Mil/uL (ref 3.87–5.11)
RDW: 14.9 % (ref 11.5–15.5)
WBC: 7.7 10*3/uL (ref 4.0–10.5)

## 2015-09-01 LAB — BASIC METABOLIC PANEL
BUN: 12 mg/dL (ref 6–23)
CO2: 29 meq/L (ref 19–32)
Calcium: 9.8 mg/dL (ref 8.4–10.5)
Chloride: 101 mEq/L (ref 96–112)
Creatinine, Ser: 1.07 mg/dL (ref 0.40–1.20)
GFR: 52.55 mL/min — ABNORMAL LOW (ref >60.00)
GLUCOSE: 137 mg/dL — AB (ref 70–99)
POTASSIUM: 4.9 meq/L (ref 3.5–5.1)
SODIUM: 140 meq/L (ref 135–145)

## 2015-09-01 LAB — HEMOGLOBIN A1C: Hgb A1c MFr Bld: 5.7 % (ref 4.6–6.5)

## 2015-09-01 LAB — TSH: TSH: 3.95 u[IU]/mL (ref 0.35–4.50)

## 2015-09-01 MED ORDER — CIPROFLOXACIN HCL 500 MG PO TABS: 500.0000 mg | ORAL_TABLET | Freq: Two times a day (BID) | ORAL | Status: DC

## 2015-09-01 NOTE — Telephone Encounter (Signed)
I sent script e-scribe to [facility] and spoke with daughter.

## 2015-09-01 NOTE — Telephone Encounter (Signed)
Call in Cipro 500 mg bid for 7 days  

## 2015-09-01 NOTE — Telephone Encounter (Signed)
Daughter called to advise that pt called her sand states it hurts really bad when she urinates. So bad she cannot leave her room. Pt is very agitated. Daughter would like to know if dr Sarajane Jews will call in a script for UTI?  Daughter can bring a UA if necessary, but pt was just here yesterday.  [facility] [phone]

## 2015-09-28 ENCOUNTER — Other Ambulatory Visit: Payer: Self-pay | Admitting: *Deleted

## 2015-09-28 MED ORDER — RAMIPRIL 10 MG PO CAPS: 10.0000 mg | ORAL_CAPSULE | Freq: Every day | ORAL | Status: AC

## 2015-10-01 ENCOUNTER — Other Ambulatory Visit: Payer: Self-pay | Admitting: *Deleted

## 2015-10-01 MED ORDER — CLOPIDOGREL BISULFATE 75 MG PO TABS: 75.0000 mg | ORAL_TABLET | Freq: Every day | ORAL | Status: AC

## 2015-10-01 MED ORDER — PRAVASTATIN SODIUM 40 MG PO TABS: 40.0000 mg | ORAL_TABLET | Freq: Every day | ORAL | Status: AC

## 2015-10-04 ENCOUNTER — Encounter: Payer: Self-pay | Admitting: Family Medicine

## 2015-10-04 ENCOUNTER — Other Ambulatory Visit: Payer: Self-pay | Admitting: *Deleted

## 2015-10-04 MED ORDER — NITROGLYCERIN 0.4 MG SL SUBL: 0.4000 mg | SUBLINGUAL_TABLET | SUBLINGUAL | Status: AC | PRN

## 2015-10-05 NOTE — Telephone Encounter (Signed)
Okay, pharmacy is noted in chart.

## 2015-10-07 NOTE — Telephone Encounter (Signed)
okay

## 2015-11-08 ENCOUNTER — Other Ambulatory Visit: Payer: Self-pay | Admitting: Family Medicine

## 2015-11-18 ENCOUNTER — Encounter: Payer: Self-pay | Admitting: Cardiovascular Disease

## 2015-11-21 IMAGING — US US ABDOMEN COMPLETE
1 series · 14 of 25 positions shown · non-contrast
Comparison: None.

CLINICAL DATA: Epigastric pain.  Elevated bilirubin.

EXAM:
ULTRASOUND ABDOMEN COMPLETE

[Series 1: us abdomen complete · 0.28mm/px · 14 of 89 slices shown]
[im 1/89]
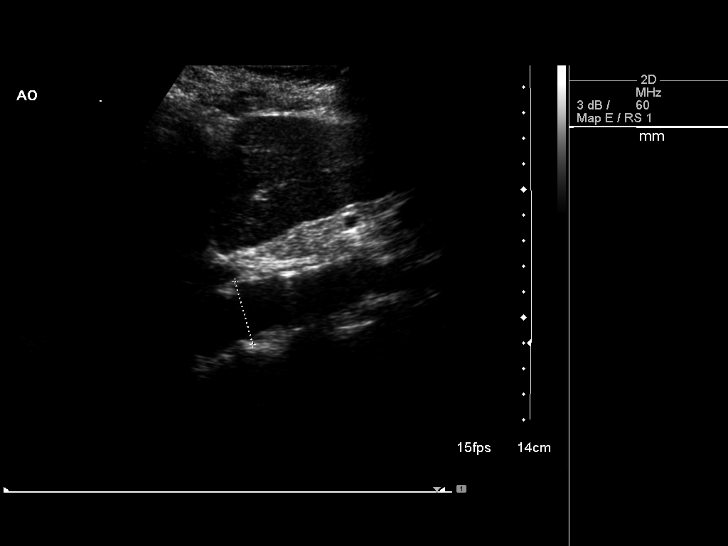
[im 8/89]
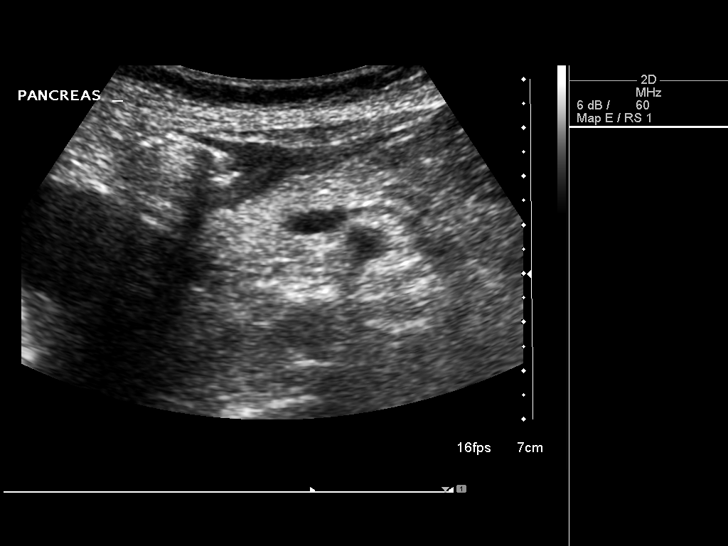
[im 15/89]
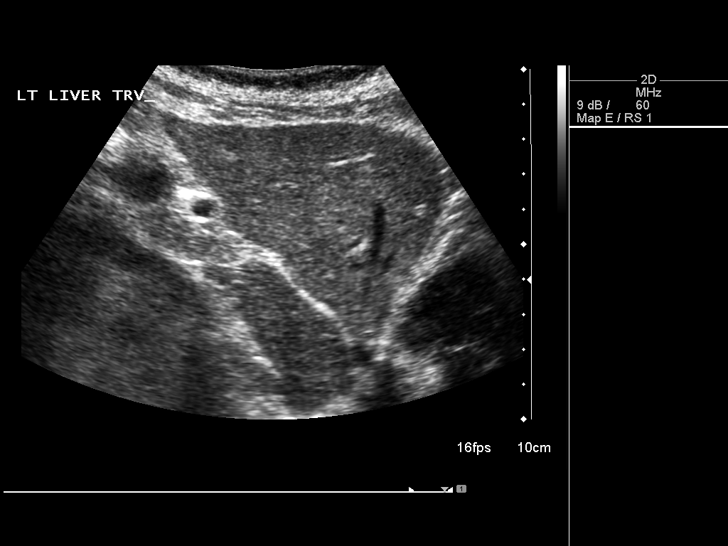
[im 23/89]
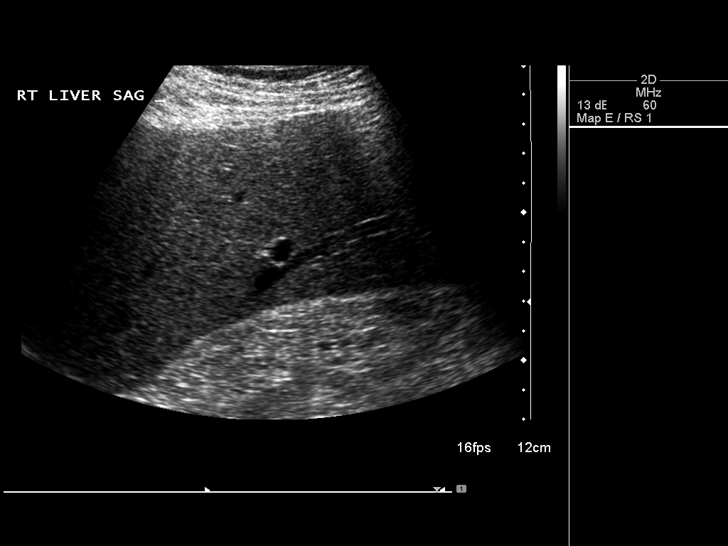
[im 30/89]
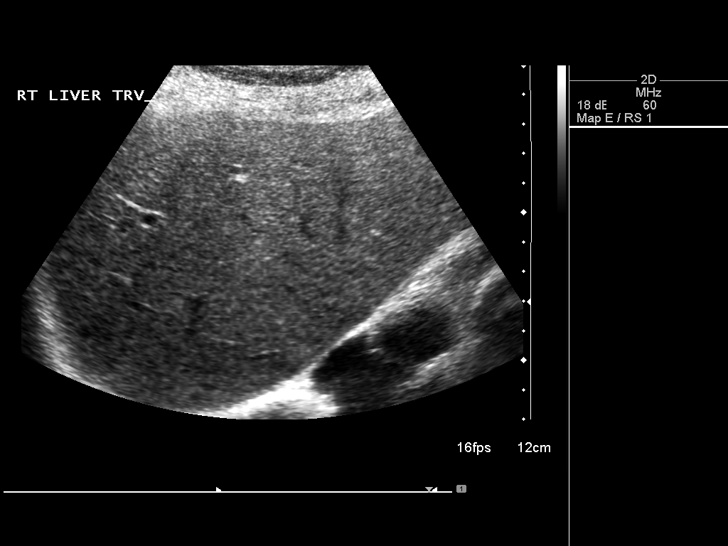
[im 34/89]
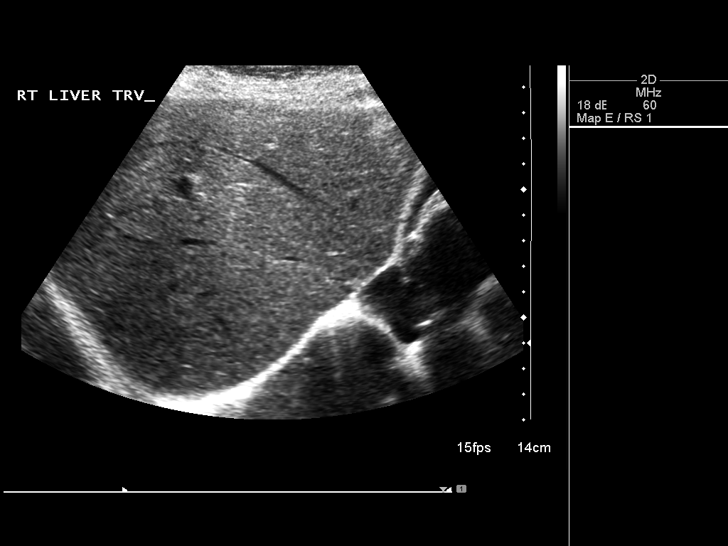
[im 41/89]
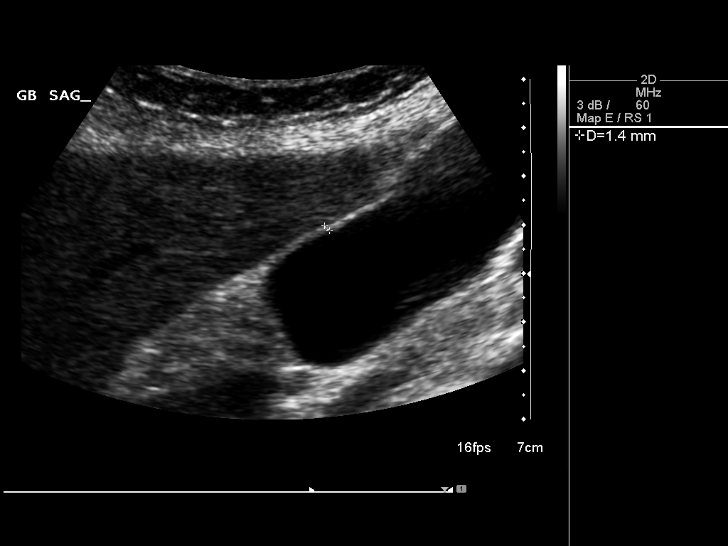
[im 48/89]
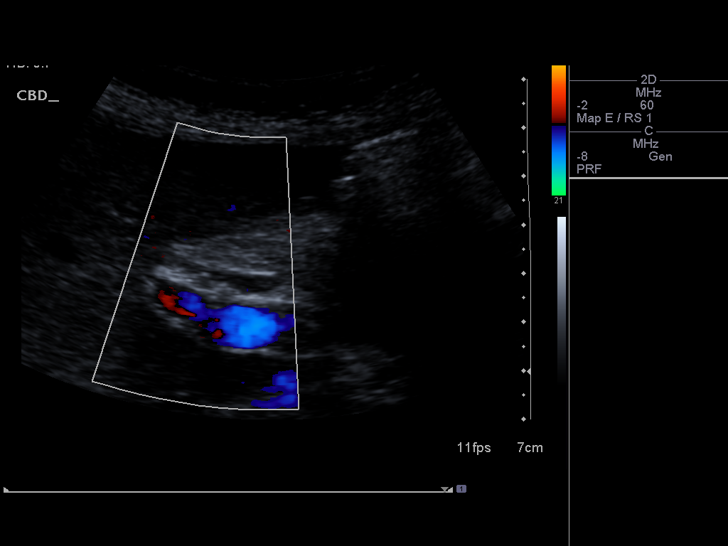
[im 56/89]
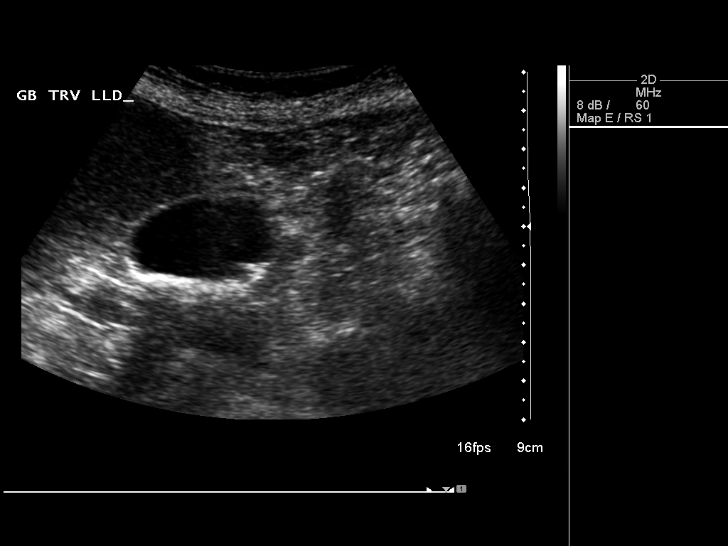
[im 59/89]
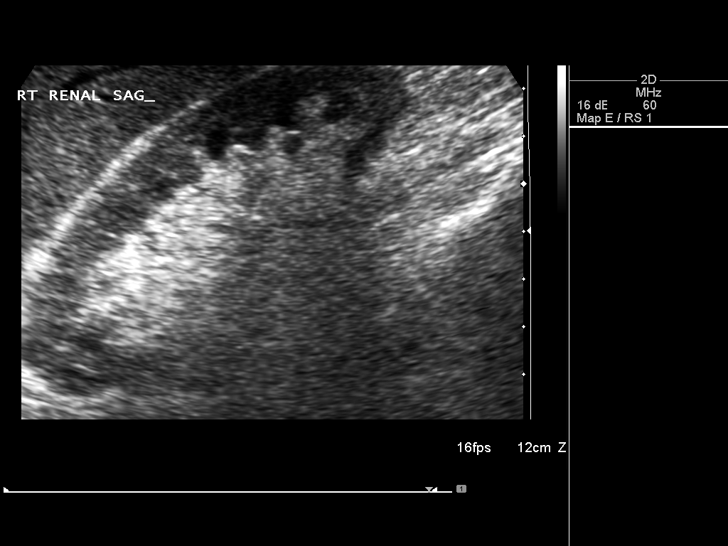
[im 67/89]
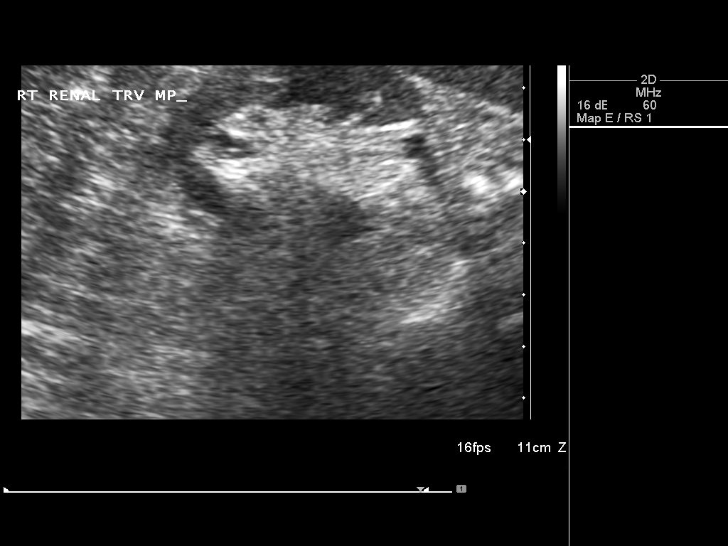
[im 74/89]
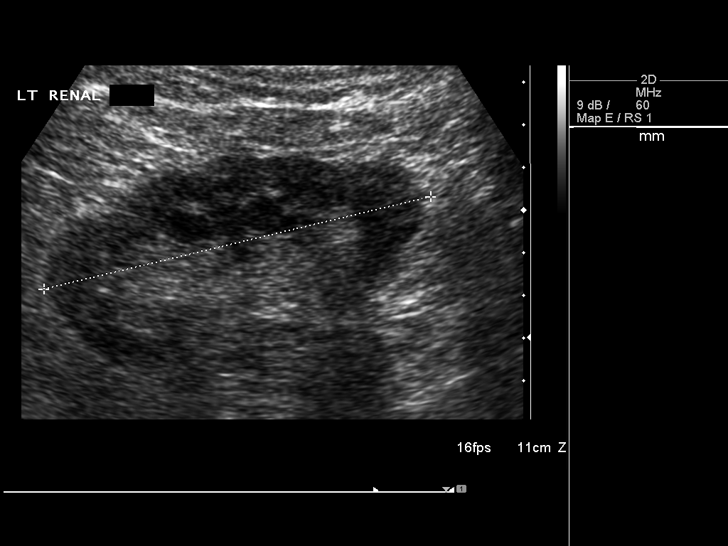
[im 81/89]
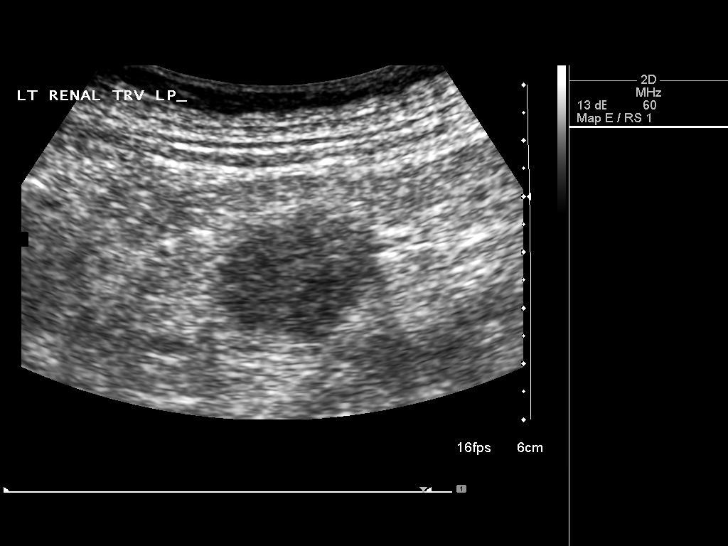
[im 89/89]
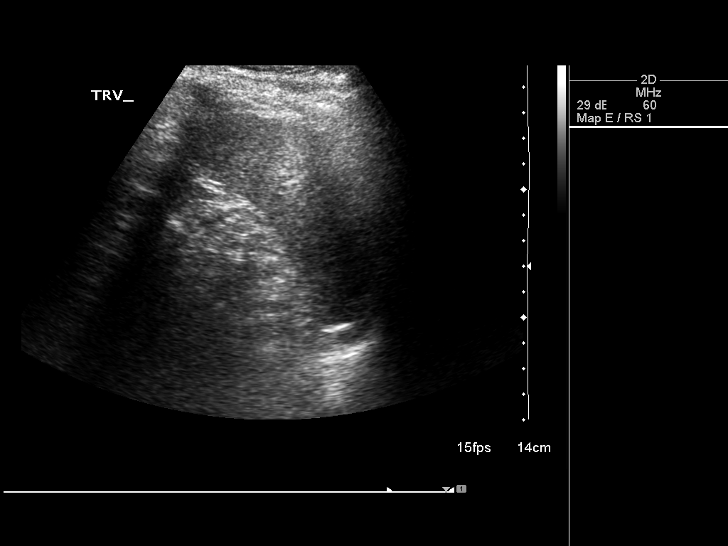

[14 of 25 positions shown; findings below may reference images not displayed]

FINDINGS: Gallbladder: Multiple small gallstones. Gallbladder wall thickness
1.4 mm. Negative Murphy's sign.

Common bile duct: Diameter: 4.7 mm

Liver: No focal lesion identified. Within normal limits in
parenchymal echogenicity.

IVC: No abnormality visualized.

Pancreas: Visualized portion unremarkable.

Spleen: Size and appearance within normal limits.

Right Kidney: Length: 10.4 cm. Echogenicity within normal limits. No
significant mass or hydronephrosis visualized.

Left Kidney: Length: 9.3 cm. Echogenicity within normal limits. No
significant mass or hydronephrosis visualized. 1.2 cm cyst
midportion left kidney.

Abdominal aorta: No aneurysm visualized.

Other findings: None.
IMPRESSION: Multiple small gallstones. No significant abnormality otherwise
noted. No biliary distention. No focal hepatic abnormality.

## 2015-12-06 ENCOUNTER — Emergency Department (HOSPITAL_COMMUNITY): Payer: Medicare Other

## 2015-12-06 ENCOUNTER — Encounter (HOSPITAL_COMMUNITY): Payer: Self-pay | Admitting: Nurse Practitioner

## 2015-12-06 ENCOUNTER — Emergency Department (HOSPITAL_COMMUNITY)
Admission: EM | Admit: 2015-12-06 | Discharge: 2015-12-06 | Disposition: A | Discharge status: Home / Self Care | Payer: Medicare Other | Attending: Emergency Medicine | Admitting: Emergency Medicine

## 2015-12-06 DIAGNOSIS — I10 Essential (primary) hypertension: Secondary | ICD-10-CM | POA: Insufficient documentation

## 2015-12-06 DIAGNOSIS — I2510 Atherosclerotic heart disease of native coronary artery without angina pectoris: Secondary | ICD-10-CM | POA: Insufficient documentation

## 2015-12-06 DIAGNOSIS — S01511A Laceration without foreign body of lip, initial encounter: Secondary | ICD-10-CM | POA: Insufficient documentation

## 2015-12-06 DIAGNOSIS — S0990XA Unspecified injury of head, initial encounter: Secondary | ICD-10-CM | POA: Diagnosis not present

## 2015-12-06 DIAGNOSIS — Z792 Long term (current) use of antibiotics: Secondary | ICD-10-CM | POA: Diagnosis not present

## 2015-12-06 DIAGNOSIS — E039 Hypothyroidism, unspecified: Secondary | ICD-10-CM | POA: Diagnosis not present

## 2015-12-06 DIAGNOSIS — Z79899 Other long term (current) drug therapy: Secondary | ICD-10-CM | POA: Diagnosis not present

## 2015-12-06 DIAGNOSIS — S59912A Unspecified injury of left forearm, initial encounter: Secondary | ICD-10-CM | POA: Diagnosis not present

## 2015-12-06 DIAGNOSIS — E785 Hyperlipidemia, unspecified: Secondary | ICD-10-CM | POA: Diagnosis not present

## 2015-12-06 DIAGNOSIS — T148 Other injury of unspecified body region: Secondary | ICD-10-CM | POA: Diagnosis not present

## 2015-12-06 DIAGNOSIS — M25551 Pain in right hip: Secondary | ICD-10-CM | POA: Diagnosis not present

## 2015-12-06 DIAGNOSIS — Y929 Unspecified place or not applicable: Secondary | ICD-10-CM | POA: Diagnosis not present

## 2015-12-06 DIAGNOSIS — Z85828 Personal history of other malignant neoplasm of skin: Secondary | ICD-10-CM | POA: Diagnosis not present

## 2015-12-06 DIAGNOSIS — Z7982 Long term (current) use of aspirin: Secondary | ICD-10-CM | POA: Insufficient documentation

## 2015-12-06 DIAGNOSIS — S59911A Unspecified injury of right forearm, initial encounter: Secondary | ICD-10-CM | POA: Diagnosis not present

## 2015-12-06 DIAGNOSIS — Z7984 Long term (current) use of oral hypoglycemic drugs: Secondary | ICD-10-CM | POA: Diagnosis not present

## 2015-12-06 DIAGNOSIS — Y999 Unspecified external cause status: Secondary | ICD-10-CM | POA: Insufficient documentation

## 2015-12-06 DIAGNOSIS — F329 Major depressive disorder, single episode, unspecified: Secondary | ICD-10-CM | POA: Insufficient documentation

## 2015-12-06 DIAGNOSIS — M25552 Pain in left hip: Secondary | ICD-10-CM | POA: Diagnosis not present

## 2015-12-06 DIAGNOSIS — Y939 Activity, unspecified: Secondary | ICD-10-CM | POA: Diagnosis not present

## 2015-12-06 DIAGNOSIS — E1140 Type 2 diabetes mellitus with diabetic neuropathy, unspecified: Secondary | ICD-10-CM | POA: Insufficient documentation

## 2015-12-06 DIAGNOSIS — W01198A Fall on same level from slipping, tripping and stumbling with subsequent striking against other object, initial encounter: Secondary | ICD-10-CM | POA: Diagnosis not present

## 2015-12-06 DIAGNOSIS — Z7902 Long term (current) use of antithrombotics/antiplatelets: Secondary | ICD-10-CM | POA: Diagnosis not present

## 2015-12-06 DIAGNOSIS — Z951 Presence of aortocoronary bypass graft: Secondary | ICD-10-CM | POA: Insufficient documentation

## 2015-12-06 DIAGNOSIS — Z87891 Personal history of nicotine dependence: Secondary | ICD-10-CM | POA: Insufficient documentation

## 2015-12-06 DIAGNOSIS — S41109A Unspecified open wound of unspecified upper arm, initial encounter: Secondary | ICD-10-CM | POA: Diagnosis not present

## 2015-12-06 DIAGNOSIS — S199XXA Unspecified injury of neck, initial encounter: Secondary | ICD-10-CM | POA: Diagnosis not present

## 2015-12-06 DIAGNOSIS — W19XXXA Unspecified fall, initial encounter: Secondary | ICD-10-CM

## 2015-12-06 DIAGNOSIS — S0993XA Unspecified injury of face, initial encounter: Secondary | ICD-10-CM | POA: Diagnosis present

## 2015-12-06 DIAGNOSIS — J90 Pleural effusion, not elsewhere classified: Secondary | ICD-10-CM | POA: Diagnosis not present

## 2015-12-06 LAB — CBC WITH DIFFERENTIAL/PLATELET
Basophils Absolute: 0 10*3/uL (ref 0.0–0.1)
Basophils Relative: 0 %
EOS ABS: 0.1 10*3/uL (ref 0.0–0.7)
Eosinophils Relative: 2 %
HEMATOCRIT: 34.4 % — AB (ref 36.0–46.0)
HEMOGLOBIN: 11.6 g/dL — AB (ref 12.0–15.0)
LYMPHS ABS: 1.9 10*3/uL (ref 0.7–4.0)
Lymphocytes Relative: 20 %
MCH: 27.6 pg (ref 26.0–34.0)
MCHC: 33.7 g/dL (ref 30.0–36.0)
MCV: 81.7 fL (ref 78.0–100.0)
MONO ABS: 0.5 10*3/uL (ref 0.1–1.0)
MONOS PCT: 6 %
NEUTROS ABS: 6.7 10*3/uL (ref 1.7–7.7)
NEUTROS PCT: 72 %
Platelets: 307 10*3/uL (ref 150–400)
RBC: 4.21 MIL/uL (ref 3.87–5.11)
RDW: 14.5 % (ref 11.5–15.5)
WBC: 9.3 10*3/uL (ref 4.0–10.5)

## 2015-12-06 LAB — COMPREHENSIVE METABOLIC PANEL
ALK PHOS: 57 U/L (ref 38–126)
ALT: 13 U/L — ABNORMAL LOW (ref 14–54)
AST: 16 U/L (ref 15–41)
Albumin: 3.8 g/dL (ref 3.5–5.0)
Anion gap: 7 (ref 5–15)
BILIRUBIN TOTAL: 0.6 mg/dL (ref 0.3–1.2)
BUN: 16 mg/dL (ref 6–20)
CALCIUM: 9.1 mg/dL (ref 8.9–10.3)
CO2: 28 mmol/L (ref 22–32)
CREATININE: 1.01 mg/dL — AB (ref 0.44–1.00)
Chloride: 100 mmol/L — ABNORMAL LOW (ref 101–111)
GFR calc non Af Amer: 52 mL/min — ABNORMAL LOW (ref >60)
GFR, EST AFRICAN AMERICAN: 60 mL/min — AB (ref >60)
GLUCOSE: 140 mg/dL — AB (ref 65–99)
Potassium: 4.1 mmol/L (ref 3.5–5.1)
Sodium: 135 mmol/L (ref 135–145)
Total Protein: 6.5 g/dL (ref 6.5–8.1)

## 2015-12-06 LAB — I-STAT TROPONIN, ED: TROPONIN I, POC: 0.01 ng/mL (ref 0.00–0.08)

## 2015-12-06 LAB — CBG MONITORING, ED: Glucose-Capillary: 142 mg/dL — ABNORMAL HIGH (ref 65–99)

## 2015-12-06 MED ORDER — LIDOCAINE HCL (PF) 1 % IJ SOLN
30.0000 mL | Freq: Once | INTRAMUSCULAR | Status: AC
  Administered 2015-12-06: 30 mL via INTRADERMAL
  Filled 2015-12-06: qty 30

## 2015-12-06 MED ORDER — SODIUM CHLORIDE 0.9 % IV BOLUS (SEPSIS)
500.0000 mL | Freq: Once | INTRAVENOUS | Status: AC
  Administered 2015-12-06: 500 mL via INTRAVENOUS

## 2015-12-06 MED ORDER — AZITHROMYCIN 250 MG PO TABS: 250.0000 mg | ORAL_TABLET | Freq: Every day | ORAL | Status: DC

## 2015-12-06 NOTE — Consult Note (Signed)
WOC wound consult note Reason for Consult: Patient's bedside RN consulted for affirmation that calcium, alginate dressing is appropriate for the unstable skin tear on left forearm. Wound type:Traumatic Pressure Ulcer POA: No Measurement: 6cm x 4cm x 0.2cm with 60% approximation and continued serosanguinous weeping. Wound bed:red, moist Drainage (amount, consistency, odor) see above Periwound:intact, thin friable Dressing procedure/placement/frequency: A conservative, and atraumatic dressing that will assist in the achievement of homeostasis is the calcium alginate dressing-an absorptive that will return to a gelatinous state after absorbing the exudate.  We will cover the alginate with a thin film dressing and top with a thin  Layer of Kerlix roll gauze to additional securement and protection until the area heals. I suspect we4 will have adequate reepithelialization in approximately 3-4 weeks. [place] nursing team will not follow, but will remain available to this patient, the nursing and medical teams.  Please re-consult if needed. Thanks, Maudie Flakes, MSN, RN, [place], Arther Abbott  Pager# [phone]

## 2015-12-06 NOTE — ED Notes (Signed)
Unable to collect labs PT not in rm

## 2015-12-06 NOTE — Progress Notes (Addendum)
ED CM spoke with ED RN about jello or other food for pt ED CM spoke with Arlyn Leak to provider referral information for [facility], PT, OT and SW  Pt was with hospice for her CV issues but is [place] with [facility]

## 2015-12-06 NOTE — ED Notes (Signed)
Bed: [id number] Expected date:  Expected time:  Means of arrival:  Comments: EMS-fall-blood thinners

## 2015-12-06 NOTE — Discharge Instructions (Signed)
You were seen and evaluated today following her fall. The sutures in your lip should follow out on their own in about 5-7 days. If they do not you can take a warm washcloth and platelets away or follow up with your primary care physician. Try to get up slowly anytime you get up to go to the bathroom or to use her walker. Call for help if you feel unsteady on your feet. Home health should be coming to your facility to help you with her daily activities.  Your chest x-ray today showed a possible early pneumonia. Since you have a little in the way of them times your prescribed a short course of antibiotics. Follow-up with your primary care physician for reevaluation.  Facial Laceration  A facial laceration is a cut on the face. These injuries can be painful and cause bleeding. Lacerations usually heal quickly, but they need special care to reduce scarring. DIAGNOSIS  Your health care provider will take a medical history, ask for details about how the injury occurred, and examine the wound to determine how deep the cut is. TREATMENT  Some facial lacerations may not require closure. Others may not be able to be closed because of an increased risk of infection. The risk of infection and the chance for successful closure will depend on various factors, including the amount of time since the injury occurred. The wound may be cleaned to help prevent infection. If closure is appropriate, pain medicines may be given if needed. Your health care provider will use stitches (sutures), wound glue (adhesive), or skin adhesive strips to repair the laceration. These tools bring the skin edges together to allow for faster healing and a better cosmetic outcome. If needed, you may also be given a tetanus shot. HOME CARE INSTRUCTIONS  Only take over-the-counter or prescription medicines as directed by your health care provider.  Follow your health care provider's instructions for wound care. These instructions will vary  depending on the technique used for closing the wound. For Sutures:  Keep the wound clean and dry.   If you were given a bandage (dressing), you should change it at least once a day. Also change the dressing if it becomes wet or dirty, or as directed by your health care provider.   Wash the wound with soap and water 2 times a day. Rinse the wound off with water to remove all soap. Pat the wound dry with a clean towel.   After cleaning, apply a thin layer of the antibiotic ointment recommended by your health care provider. This will help prevent infection and keep the dressing from sticking.   You may shower as usual after the first 24 hours. Do not soak the wound in water until the sutures are removed.   Get your sutures removed as directed by your health care provider. With facial lacerations, sutures should usually be taken out after 4-5 days to avoid stitch marks.   Wait a few days after your sutures are removed before applying any makeup. For Skin Adhesive Strips:  Keep the wound clean and dry.   Do not get the skin adhesive strips wet. You may bathe carefully, using caution to keep the wound dry.   If the wound gets wet, pat it dry with a clean towel.   Skin adhesive strips will fall off on their own. You may trim the strips as the wound heals. Do not remove skin adhesive strips that are still stuck to the wound. They will fall off in  time.  For Wound Adhesive:  You may briefly wet your wound in the shower or bath. Do not soak or scrub the wound. Do not swim. Avoid periods of heavy sweating until the skin adhesive has fallen off on its own. After showering or bathing, gently pat the wound dry with a clean towel.   Do not apply liquid medicine, cream medicine, ointment medicine, or makeup to your wound while the skin adhesive is in place. This may loosen the film before your wound is healed.   If a dressing is placed over the wound, be careful not to apply tape directly  over the skin adhesive. This may cause the adhesive to be pulled off before the wound is healed.   Avoid prolonged exposure to sunlight or tanning lamps while the skin adhesive is in place.  The skin adhesive will usually remain in place for 5-10 days, then naturally fall off the skin. Do not pick at the adhesive film.  After Healing: Once the wound has healed, cover the wound with sunscreen during the day for 1 full year. This can help minimize scarring. Exposure to ultraviolet light in the first year will darken the scar. It can take 1-2 years for the scar to lose its redness and to heal completely.  SEEK MEDICAL CARE IF:  You have a fever. SEEK IMMEDIATE MEDICAL CARE IF:  You have redness, pain, or swelling around the wound.   You see ayellowish-white fluid (pus) coming from the wound.    This information is not intended to replace advice given to you by your health care provider. Make sure you discuss any questions you have with your health care provider.   Document Released: 08/17/2004 Document Revised: 07/31/2014 Document Reviewed: 02/20/2013 Elsevier Interactive Patient Education 2016 [place] in the Home  Falls can cause injuries and can affect people from all age groups. There are many simple things that you can do to make your home safe and to help prevent falls. WHAT CAN I DO ON THE OUTSIDE OF MY HOME?  Regularly repair the edges of walkways and driveways and fix any cracks.  Remove high doorway thresholds.  Trim any shrubbery on the main path into your home.  Use bright outdoor lighting.  Clear walkways of debris and clutter, including tools and rocks.  Regularly check that handrails are securely fastened and in good repair. Both sides of any steps should have handrails.  Install guardrails along the edges of any raised decks or porches.  Have leaves, snow, and ice cleared regularly.  Use sand or salt on walkways during winter  months.  In the garage, clean up any spills right away, including grease or oil spills. WHAT CAN I DO IN THE BATHROOM?  Use night lights.  Install grab bars by the toilet and in the tub and shower. Do not use towel bars as grab bars.  Use non-skid mats or decals on the floor of the tub or shower.  If you need to sit down while you are in the shower, use a plastic, non-slip stool.Marland Kitchen  Keep the floor dry. Immediately clean up any water that spills on the floor.  Remove soap buildup in the tub or shower on a regular basis.  Attach bath mats securely with double-sided non-slip rug tape.  Remove throw rugs and other tripping hazards from the floor. WHAT CAN I DO IN THE BEDROOM?  Use night lights.  Make sure that a bedside light is easy to reach.  Do not use oversized bedding that drapes onto the floor.  Have a firm chair that has side arms to use for getting dressed.  Remove throw rugs and other tripping hazards from the floor. WHAT CAN I DO IN THE KITCHEN?   Clean up any spills right away.  Avoid walking on wet floors.  Place frequently used items in easy-to-reach places.  If you need to reach for something above you, use a sturdy step stool that has a grab bar.  Keep electrical cables out of the way.  Do not use floor polish or wax that makes floors slippery. If you have to use wax, make sure that it is non-skid floor wax.  Remove throw rugs and other tripping hazards from the floor. WHAT CAN I DO IN THE STAIRWAYS?  Do not leave any items on the stairs.  Make sure that there are handrails on both sides of the stairs. Fix handrails that are broken or loose. Make sure that handrails are as long as the stairways.  Check any carpeting to make sure that it is firmly attached to the stairs. Fix any carpet that is loose or worn.  Avoid having throw rugs at the top or bottom of stairways, or secure the rugs with carpet tape to prevent them from moving.  Make sure that you  have a light switch at the top of the stairs and the bottom of the stairs. If you do not have them, have them installed. WHAT ARE SOME OTHER FALL PREVENTION TIPS?  Wear closed-toe shoes that fit well and support your feet. Wear shoes that have rubber soles or low heels.  When you use a stepladder, make sure that it is completely opened and that the sides are firmly locked. Have someone hold the ladder while you are using it. Do not climb a closed stepladder.  Add color or contrast paint or tape to grab bars and handrails in your home. Place contrasting color strips on the first and last steps.  Use mobility aids as needed, such as canes, walkers, scooters, and crutches.  Turn on lights if it is dark. Replace any light bulbs that burn out.  Set up furniture so that there are clear paths. Keep the furniture in the same spot.  Fix any uneven floor surfaces.  Choose a carpet design that does not hide the edge of steps of a stairway.  Be aware of any and all pets.  Review your medicines with your healthcare provider. Some medicines can cause dizziness or changes in blood pressure, which increase your risk of falling. Talk with your health care provider about other ways that you can decrease your risk of falls. This may include working with a physical therapist or trainer to improve your strength, balance, and endurance.   This information is not intended to replace advice given to you by your health care provider. Make sure you discuss any questions you have with your health care provider.   Document Released: 06/30/2002 Document Revised: 11/24/2014 Document Reviewed: 08/14/2014 Elsevier Interactive Patient Education [employer].

## 2015-12-06 NOTE — ED Notes (Signed)
Will apply calcium alginate dressing to patient's left forearm per Dr. Alfonse Spruce.

## 2015-12-06 NOTE — ED Notes (Signed)
Nurse at bedside doing orthostatic vitals unable to collect labs at this time

## 2015-12-06 NOTE — ED Provider Notes (Signed)
CSN: [id number]     Arrival date & time 12/06/15  0746 History   First MD Initiated Contact with Patient 12/06/15 0759     Chief Complaint  Patient presents with  . Fall     (Consider location/radiation/quality/duration/timing/severity/associated sxs/prior Treatment) HPI Comments: 82 y.o. Female with history of CAD, DM presents for evaluation following a fall.  The patient is in an independent living facility.  She says that she got up quickly and then is unsure what happened but fell striking her lip and chin.  The patient reports pain in her bilateral forearms.  She reports otherwise feeling well.  Denies headache, chest pain, shortness of breath, nausea, vomiting, abdominal pain.  Denies weakness.  No palpitations.      Past Medical History  Diagnosis Date  . Coronary artery disease     sees Dr. Johnsie Cancel, in Hospice for this   . Chest pain, atypical   . PVD (peripheral vascular disease) ([place])   . Hypertension   . Dyslipidemia   . Diabetes mellitus type II   . Peptic ulcer disease   . Depression   . Diabetic neuropathy ([place])   . Hypothyroidism   . Back pain     low  . Cancer, skin, squamous cell     sees Dr. Lois Huxley at [facility] dermatology  . Confusion    Past Surgical History  Procedure Laterality Date  . Appendectomy    . Tonsillectomy    . Coronary artery bypass graft      stent placement 2001  . Cataract extraction    . Colonoscopy  02-20-08    per Dr. Olevia Perches, diverticulosis, no polyps, repeat in 10 yrs    Family History  Problem Relation Age of Onset  . Emphysema Mother   . Heart attack Father   . Diabetes Father   . Breast cancer Maternal Grandmother   . Diabetes Paternal Grandfather   . Hypertension      family history   Social History  Substance Use Topics  . Smoking status: Former Research scientist (life sciences)  . Smokeless tobacco: Never Used  . Alcohol Use: No   OB History    No data available     Review of Systems  Constitutional: Negative for fever, chills  and fatigue.  HENT: Negative for congestion, ear pain, postnasal drip and sore throat.   Eyes: Negative for visual disturbance.  Respiratory: Negative for cough, chest tightness and shortness of breath.   Cardiovascular: Negative for chest pain and palpitations.  Gastrointestinal: Negative for nausea, vomiting, abdominal pain, diarrhea and constipation.  Genitourinary: Negative for dysuria, hematuria and flank pain.  Musculoskeletal: Positive for arthralgias. Negative for myalgias, back pain, neck pain and neck stiffness.  Skin: Positive for wound.  Neurological: Negative for dizziness, weakness, light-headedness, numbness and headaches.  Hematological: Bruises/bleeds easily (on plavix).      Allergies  Codeine; Morphine; Anbesol cold sore therapy; and Other  Home Medications   Prior to Admission medications   Medication Sig Start Date End Date Taking? Authorizing Provider  amitriptyline (ELAVIL) 50 MG tablet Take 1 tablet (50 mg total) by mouth at bedtime. 08/23/15  Yes Laurey Morale, MD  Ascorbic Acid (VITAMIN C) 500 MG tablet Take 500 mg by mouth daily.     Yes Historical Provider, MD  aspirin EC 81 MG tablet Take 81 mg by mouth at bedtime.   Yes Historical Provider, MD  atenolol (TENORMIN) 50 MG tablet Take 1 tablet by mouth  every morning 11/09/15  Yes Laurey Morale, MD  BEE POLLEN PO Take 1 tablet by mouth daily.   Yes Historical Provider, MD  Calcium Carbonate (CALCIUM 600 PO) Take 600 mg by mouth daily.    Yes Historical Provider, MD  Cholecalciferol (VITAMIN D) 1000 UNITS capsule Take 1,000 Units by mouth daily.     Yes Historical Provider, MD  clopidogrel (PLAVIX) 75 MG tablet Take 1 tablet (75 mg total) by mouth daily. 10/01/15  Yes Laurey Morale, MD  Cranberry ([employer] CRANBERRY) 500 MG CAPS Take 500 mg by mouth daily.   Yes Historical Provider, MD  isosorbide dinitrate (ISORDIL) 30 MG tablet Take 1 tablet (30 mg total) by mouth 2 (two) times daily. 06/04/15  Yes Laurey Morale, MD   metFORMIN (GLUCOPHAGE) 1000 MG tablet TAKE ONE TABLET BY MOUTH ONCE DAILY WITH BREAKFAST 08/23/15  Yes Laurey Morale, MD  Multiple Vitamin (MULTIVITAMIN) capsule Take 1 capsule by mouth daily.     Yes Historical Provider, MD  nitroGLYCERIN (NITROSTAT) 0.4 MG SL tablet Place 1 tablet (0.4 mg total) under the tongue every 5 (five) minutes as needed for chest pain (3 doses max). Reported on 08/31/2015 10/04/15  Yes Josue Hector, MD  Omega-3 Fatty Acids (FISH OIL PO) Take 1 capsule by mouth daily.   Yes Historical Provider, MD  ondansetron (ZOFRAN ODT) 4 MG disintegrating tablet Take 1 tablet (4 mg total) by mouth every 8 (eight) hours as needed for nausea. 10/31/14  Yes Tanna Furry, MD  pravastatin (PRAVACHOL) 40 MG tablet Take 1 tablet (40 mg total) by mouth daily. 10/01/15  Yes Laurey Morale, MD  ramipril (ALTACE) 10 MG capsule Take 1 capsule (10 mg total) by mouth daily. 09/28/15  Yes Laurey Morale, MD  ranolazine (RANEXA) 500 MG 12 hr tablet Take 1 tablet (500 mg total) by mouth daily. 08/31/15  Yes Laurey Morale, MD  valACYclovir (VALTREX) 500 MG tablet Take 1 tablet (500 mg total) by mouth 2 (two) times daily as needed (fever blisters). 06/08/14  Yes Geradine Girt, DO  ciprofloxacin (CIPRO) 500 MG tablet Take 1 tablet (500 mg total) by mouth 2 (two) times daily. Patient not taking: Reported on 12/06/2015 09/01/15   Laurey Morale, MD  docusate sodium 100 MG CAPS Take 100 mg by mouth daily as needed for mild constipation. Patient not taking: Reported on 12/06/2015 06/08/14   Jessica U Vann, DO   BP 190/80 mmHg  Pulse 70  Temp(Src) 98.5 F (36.9 C) (Oral)  Resp 17  SpO2 97% Physical Exam  Constitutional: She is oriented to person, place, and time. She appears well-developed and well-nourished. No distress.  HENT:  Head: Normocephalic.  Right Ear: External ear normal.  Left Ear: External ear normal.  Nose: Nose normal.  Mouth/Throat: Oropharynx is clear and moist. No oropharyngeal exudate.    Eyes:  EOM are normal. Pupils are equal, round, and reactive to light.  Neck: Normal range of motion. Neck supple.  Cardiovascular: Normal rate, regular rhythm and intact distal pulses.   No murmur heard. Pulmonary/Chest: Effort normal. No respiratory distress. She has no wheezes. She has no rales.  Abdominal: Soft. She exhibits no distension. There is no tenderness.  Musculoskeletal: Normal range of motion. She exhibits no edema.       Right shoulder: Normal.       Left shoulder: Normal.       Right elbow: Normal.      Left elbow: Normal.  Right forearm: She exhibits no tenderness, no swelling, no edema and no laceration.       Left forearm: She exhibits tenderness, swelling and laceration (skin tear).  Neurological: She is alert and oriented to person, place, and time.  Skin: Skin is warm and dry. Bruising (over the chin) noted. No rash noted. She is not diaphoretic.     Vitals reviewed.   ED Course  .Marland KitchenLaceration Repair Date/Time: 12/07/2015 10:39 PM Performed by: Lonia Skinner ROE Authorized by: Harvel Quale Consent: Verbal consent obtained. Risks and benefits: risks, benefits and alternatives were discussed Consent given by: patient Patient understanding: patient states understanding of the procedure being performed Required items: required blood products, implants, devices, and special equipment available Patient identity confirmed: verbally with patient and arm band Body area: head/neck Location details: lower lip Full thickness lip laceration: no [place] border involved: no Laceration length: 0.5 cm Foreign bodies: no foreign bodies Nerve involvement: none Vascular damage: no Anesthesia: local infiltration Local anesthetic: lidocaine 1% without epinephrine Patient sedated: no Irrigation solution: saline Irrigation method: syringe Amount of cleaning: extensive Debridement: none Degree of undermining: none Wound skin closure material used: 6-0 plain gut, fast  absorbing. Number of sutures: 3 Approximation difficulty: simple Patient tolerance: Patient tolerated the procedure well with no immediate complications   (including critical care time) Labs Review Labs Reviewed  CBC WITH DIFFERENTIAL/PLATELET - Abnormal; Notable for the following:    Hemoglobin 11.6 (*)    HCT 34.4 (*)    All other components within normal limits  COMPREHENSIVE METABOLIC PANEL - Abnormal; Notable for the following:    Chloride 100 (*)    Glucose, Bld 140 (*)    Creatinine, Ser 1.01 (*)    ALT 13 (*)    GFR calc non Af Amer 52 (*)    GFR calc Af Amer 60 (*)    All other components within normal limits  CBG MONITORING, ED - Abnormal; Notable for the following:    Glucose-Capillary 142 (*)    All other components within normal limits  I-STAT TROPOININ, ED    Imaging Review Dg Chest 2 View  12/06/2015  CLINICAL DATA:  Pain following fall EXAM: CHEST  2 VIEW COMPARISON:  June 05, 2014 FINDINGS: There is a small right pleural effusion. There is patchy opacity in the right mid lung, suspicious for early pneumonia. Lungs elsewhere clear. Heart size and pulmonary vascularity are normal. Patient is status post coronary artery bypass grafting. No adenopathy.  No pneumothorax.  No fractures are evident. IMPRESSION: Area of patchy opacity in the right mid lung suspicious for early pneumonia. Small right effusion. Lungs elsewhere clear. No pneumothorax or fracture apparent. Electronically Signed   By: Lowella Grip III M.D.   On: 12/06/2015 10:16   Dg Forearm Left  12/06/2015  CLINICAL DATA:  [place] this morning.  Skin laceration to the forearm. EXAM: LEFT FOREARM - 2 VIEW COMPARISON:  None. FINDINGS: Lucency in the mid forearm soft tissues compatible with a laceration. Negative for a fracture involving the forearm bones. No gross abnormality to the wrist or elbow joint. Degenerative changes at the first carpometacarpal joint. IMPRESSION: No acute bone abnormality to the left  forearm. Soft tissue injury as described. Electronically Signed   By: Markus Daft M.D.   On: 12/06/2015 10:15   Dg Forearm Right  12/06/2015  CLINICAL DATA:  Fall. EXAM: RIGHT FOREARM - 2 VIEW COMPARISON:  No recent prior. FINDINGS: No acute bony or joint abnormality. No evidence of  fracture or dislocation. Degenerative changes right elbow on wrist. IMPRESSION: 1. No acute abnormality. 2. Degenerative changes right elbow on wrist. Electronically Signed   By: DeBary   On: 12/06/2015 10:23   Ct Head Wo Contrast  12/06/2015  CLINICAL DATA:  Fall this morning at assisted living facility. Laceration to the lower lip and chin. Patient is on Plavix. EXAM: CT HEAD WITHOUT CONTRAST CT CERVICAL SPINE WITHOUT CONTRAST TECHNIQUE: Multidetector CT imaging of the head and cervical spine was performed following the standard protocol without intravenous contrast. Multiplanar CT image reconstructions of the cervical spine were also generated. COMPARISON:  10/31/2014 and 06/05/2014 FINDINGS: CT HEAD FINDINGS Stable mild cerebral atrophy. Mild dilatation of the ventricles related to atrophy. No evidence for acute hemorrhage, mass lesion, midline shift, hydrocephalus or large infarct. Stable low-density in the subcortical white matter. Visualized paranasal sinuses are clear. No calvarial fracture. CT CERVICAL SPINE FINDINGS Prominent lymph nodes on both sides of the neck. Right supraclavicular lymph node on sequence 5, image 65 measures 1 cm in the short axis and measured 0.8 cm in 2015. No significant change in the cervical lymph nodes from the prior cervical spine CT. No significant soft tissue swelling in the neck. Mild scarring at the lung apices. No pneumothorax. Minimal retrolisthesis at C3-C4 is unchanged. Multilevel disc space narrowing. Prominent disc space narrowing and endplate disease at QA348G. Prominent right-sided disc osteophyte complex at T2-T3. Negative for a fracture or dislocation. IMPRESSION: No acute  intracranial abnormality. Stable cerebral atrophy. Multilevel degenerative disease in the cervical spine without acute abnormality. Prominent cervical lymph nodes bilaterally. Minimal change since 2015. Electronically Signed   By: Markus Daft M.D.   On: 12/06/2015 09:57   Ct Cervical Spine Wo Contrast  12/06/2015  CLINICAL DATA:  Fall this morning at assisted living facility. Laceration to the lower lip and chin. Patient is on Plavix. EXAM: CT HEAD WITHOUT CONTRAST CT CERVICAL SPINE WITHOUT CONTRAST TECHNIQUE: Multidetector CT imaging of the head and cervical spine was performed following the standard protocol without intravenous contrast. Multiplanar CT image reconstructions of the cervical spine were also generated. COMPARISON:  10/31/2014 and 06/05/2014 FINDINGS: CT HEAD FINDINGS Stable mild cerebral atrophy. Mild dilatation of the ventricles related to atrophy. No evidence for acute hemorrhage, mass lesion, midline shift, hydrocephalus or large infarct. Stable low-density in the subcortical white matter. Visualized paranasal sinuses are clear. No calvarial fracture. CT CERVICAL SPINE FINDINGS Prominent lymph nodes on both sides of the neck. Right supraclavicular lymph node on sequence 5, image 65 measures 1 cm in the short axis and measured 0.8 cm in 2015. No significant change in the cervical lymph nodes from the prior cervical spine CT. No significant soft tissue swelling in the neck. Mild scarring at the lung apices. No pneumothorax. Minimal retrolisthesis at C3-C4 is unchanged. Multilevel disc space narrowing. Prominent disc space narrowing and endplate disease at QA348G. Prominent right-sided disc osteophyte complex at T2-T3. Negative for a fracture or dislocation. IMPRESSION: No acute intracranial abnormality. Stable cerebral atrophy. Multilevel degenerative disease in the cervical spine without acute abnormality. Prominent cervical lymph nodes bilaterally. Minimal change since 2015. Electronically Signed    By: Markus Daft M.D.   On: 12/06/2015 09:57   Dg Hips Bilat With Pelvis 2v  12/06/2015  CLINICAL DATA:  Pain following fall EXAM: DG HIP (WITH OR WITHOUT PELVIS) 4+V BILAT COMPARISON:  October 23, 2014 FINDINGS: Frontal pelvis as well as frontal and lateral views of each hip -total five views -obtained. There  is no acute fracture or dislocation. There is myositis ossifications in the lateral aspect of the right hip joint, stable. There is moderate narrowing of the right hip joint with slight narrowing of the left hip joint. No erosive change. There are multiple foci of atherosclerotic calcification. IMPRESSION: Myositis ossificans lateral right hip joint, stable. Osteoarthritic change in both hip joints, more severe on the right than on the left, stable. No acute fracture or dislocation. Atherosclerotic calcification bilaterally, grossly stable. Electronically Signed   By: Lowella Grip III M.D.   On: 12/06/2015 10:17   I have personally reviewed and evaluated these images and lab results as part of my medical decision-making.   EKG Interpretation   Date/Time:  Monday Dec 06 2015 07:58:17 EDT Ventricular Rate:  68 PR Interval:  169 QRS Duration: 101 QT Interval:  426 QTC Calculation: 453 R Axis:   1 Text Interpretation:  Sinus rhythm Abnormal R-wave progression, early  transition Left ventricular hypertrophy No significant change since last  tracing Confirmed by NGUYEN, EMILY ([id number]) on 12/06/2015 8:14:20 AM      MDM  Patient was seen and evaluated in stable condition.  Patient with large amount of active bleeding from lower lip.  Pressure applied but still unable to control bleeding.  For this reason 3 sutures were placed in left side of lower lip.  Other than small laceration rest of the lip injured with hematoma and abrasion without ability to close further.  Imaging without acute process.  Patient with tetanus shot last year.  No need for booster today.  Wounds cleaned and dressings  applied.  Labs were unreamrkable.  Patien able to ambulate to bathroom without issue and with minimal assistance.  Care management supplied resources to the patient and her daughter.  All results and clinical impression were discussed with both of them at bedside.  They expressed understanding and agreement with plan of care.  Patient discharged home in stable condition with strict return precautions. Final diagnoses:  None    1. Lip laceration  2. Skin tears     Harvel Quale, MD 12/07/15 2245

## 2015-12-06 NOTE — ED Notes (Signed)
Patient presents to [facility] after suffering a fall this morning at her assisted living facility, [facility] ([place] She suffered to her lower lip, chin, left arm which are bleeding. She takes Plavix. She is now also complaining of burning right arm pain with no obvious injury or deformity. She is unsure of what caused her fall. Denies seeing spots, dizziness, or mechanical fall. She remembers 'standing and then falling'.

## 2015-12-06 NOTE — ED Notes (Signed)
Pt ambulated from room to bathroom with minimal assist

## 2015-12-06 NOTE — Progress Notes (Addendum)
ED Cm consulted by ED unit secretary and charge Rn about pt needing snf resources  CM spoke with pt and daughter at bedside to assess that pt is living in independent living apt at [facility] (first floor level near dining hall) Pt has been there for ? 4+ years Pt has had increase falling recently and daughter has been informed that pt may need to qualify to get to snf from hospital Pt not meeting for admission and daughter states she has been informed she will be d/c  CM provided a list of snf and cm office contact number.  Daughter explained that they could not afford AFL or snf out of pocket at this time Cm discussed placement using community resources to include home health staff and pcp Pt already has "Hinton Dyer" a hired CNA coming to assist with pt and to do medication management Pt has a cane, RW, Bedside commode, rollator and w/c  CM reviewed in details medicare guidelines, Choices of home health ([place]) (length of stay in home, types of [facility] staff available, coverage, primary caregiver, up to 24 hrs before services may be started) and choices of Private duty nursing (PDN-coverage, length of stay in the home types of staff available),assisted living (ASL- coverage, services offered) and Skilled nursing facilities (snf- coverage and services offered)   CM reviewed availability of HH SW, PT/OT toassist pcp to get pt to snf (if desired disposition) from the community level.   Pt agreeing to [facility] services Pt choice of agency is [facility] Pt states she had services from [facility] as her [place].  Cm also discussed [facility] to assist her with an evaluation that may be able to provided DME , services to allow pt to remain as independent as possible at [facility] in [place] and EDP updated

## 2015-12-08 DIAGNOSIS — W19XXXD Unspecified fall, subsequent encounter: Secondary | ICD-10-CM | POA: Diagnosis not present

## 2015-12-08 DIAGNOSIS — S01501D Unspecified open wound of lip, subsequent encounter: Secondary | ICD-10-CM | POA: Diagnosis not present

## 2015-12-08 DIAGNOSIS — S51802D Unspecified open wound of left forearm, subsequent encounter: Secondary | ICD-10-CM | POA: Diagnosis not present

## 2015-12-08 DIAGNOSIS — Z7982 Long term (current) use of aspirin: Secondary | ICD-10-CM | POA: Diagnosis not present

## 2015-12-08 DIAGNOSIS — Z9981 Dependence on supplemental oxygen: Secondary | ICD-10-CM | POA: Diagnosis not present

## 2015-12-08 DIAGNOSIS — R296 Repeated falls: Secondary | ICD-10-CM | POA: Diagnosis not present

## 2015-12-09 ENCOUNTER — Telehealth: Payer: Self-pay | Admitting: Family Medicine

## 2015-12-09 NOTE — Telephone Encounter (Signed)
noted 

## 2015-12-09 NOTE — Telephone Encounter (Signed)
Debbie from [facility] call to say she saw pt yesterday and when she changed the dressing on pt arm from the hosp did not hold so dressing was saturated with blood. Changed dressing and will be doing so twice a week .   [phone]

## 2015-12-13 DIAGNOSIS — W19XXXD Unspecified fall, subsequent encounter: Secondary | ICD-10-CM | POA: Diagnosis not present

## 2015-12-13 DIAGNOSIS — Z7982 Long term (current) use of aspirin: Secondary | ICD-10-CM | POA: Diagnosis not present

## 2015-12-13 DIAGNOSIS — R296 Repeated falls: Secondary | ICD-10-CM | POA: Diagnosis not present

## 2015-12-13 DIAGNOSIS — Z9981 Dependence on supplemental oxygen: Secondary | ICD-10-CM | POA: Diagnosis not present

## 2015-12-13 DIAGNOSIS — S01501D Unspecified open wound of lip, subsequent encounter: Secondary | ICD-10-CM | POA: Diagnosis not present

## 2015-12-13 DIAGNOSIS — S51802D Unspecified open wound of left forearm, subsequent encounter: Secondary | ICD-10-CM | POA: Diagnosis not present

## 2015-12-14 ENCOUNTER — Ambulatory Visit: Payer: Medicare Other | Admitting: Family Medicine

## 2015-12-14 ENCOUNTER — Telehealth: Payer: Self-pay | Admitting: Family Medicine

## 2015-12-14 DIAGNOSIS — S01501D Unspecified open wound of lip, subsequent encounter: Secondary | ICD-10-CM | POA: Diagnosis not present

## 2015-12-14 DIAGNOSIS — R296 Repeated falls: Secondary | ICD-10-CM | POA: Diagnosis not present

## 2015-12-14 DIAGNOSIS — Z9981 Dependence on supplemental oxygen: Secondary | ICD-10-CM | POA: Diagnosis not present

## 2015-12-14 DIAGNOSIS — W19XXXD Unspecified fall, subsequent encounter: Secondary | ICD-10-CM | POA: Diagnosis not present

## 2015-12-14 DIAGNOSIS — S51802D Unspecified open wound of left forearm, subsequent encounter: Secondary | ICD-10-CM | POA: Diagnosis not present

## 2015-12-14 DIAGNOSIS — Z7982 Long term (current) use of aspirin: Secondary | ICD-10-CM | POA: Diagnosis not present

## 2015-12-14 NOTE — Telephone Encounter (Signed)
Okay per Dr. Sarajane Jews and I did speak with Chariss and gave the verbal order for PT.

## 2015-12-14 NOTE — Telephone Encounter (Signed)
Okay per Dr. Sarajane Jews and I called and left a voice message with verbal orders for below request.

## 2015-12-14 NOTE — Telephone Encounter (Signed)
Carla Friedman OT would like verbal orders for pt to have ot for twice a wk for 4 wks

## 2015-12-14 NOTE — Telephone Encounter (Signed)
Chariss is calling for verbal order for PT twice a wk for 8 wks

## 2015-12-15 ENCOUNTER — Encounter: Payer: Self-pay | Admitting: Family Medicine

## 2015-12-15 ENCOUNTER — Ambulatory Visit (INDEPENDENT_AMBULATORY_CARE_PROVIDER_SITE_OTHER): Payer: Medicare Other | Admitting: Family Medicine

## 2015-12-15 VITALS — BP 150/62 | HR 70 | Wt 140.7 lb

## 2015-12-15 DIAGNOSIS — S40812D Abrasion of left upper arm, subsequent encounter: Secondary | ICD-10-CM

## 2015-12-15 DIAGNOSIS — M6530 Trigger finger, unspecified finger: Secondary | ICD-10-CM

## 2015-12-15 DIAGNOSIS — I2510 Atherosclerotic heart disease of native coronary artery without angina pectoris: Secondary | ICD-10-CM | POA: Diagnosis not present

## 2015-12-15 DIAGNOSIS — S0083XD Contusion of other part of head, subsequent encounter: Secondary | ICD-10-CM

## 2015-12-15 DIAGNOSIS — F0390 Unspecified dementia without behavioral disturbance: Secondary | ICD-10-CM | POA: Diagnosis not present

## 2015-12-15 DIAGNOSIS — Z111 Encounter for screening for respiratory tuberculosis: Secondary | ICD-10-CM | POA: Diagnosis not present

## 2015-12-15 DIAGNOSIS — R296 Repeated falls: Secondary | ICD-10-CM | POA: Diagnosis not present

## 2015-12-15 DIAGNOSIS — I10 Essential (primary) hypertension: Secondary | ICD-10-CM

## 2015-12-15 DIAGNOSIS — Z9181 History of falling: Secondary | ICD-10-CM

## 2015-12-15 DIAGNOSIS — S01511D Laceration without foreign body of lip, subsequent encounter: Secondary | ICD-10-CM

## 2015-12-15 NOTE — Progress Notes (Signed)
   Subjective:    Patient ID: Carla Friedman, female    DOB: 1934/01/24, 82 y.o.   MRN: [id number]  HPI Here with her daughter for several issues. First she was seen in the ER on 12-06-15 after a fall at home. Apparently she felt dizzy when standing up from bed and fell forward. No LOC. She had extensive bruising to the left forearm and to the chin. She had a small laceration to the lower lip. All Xrays including CT scans of the neck and head were negative. The left forearm was dressed and [place] home health is changing the dressing 3 days a week. The lip was sutured with dissolvable sutures. She has done well since then with little pain. She also mentions a problem with the left pinkie finger getting stuck in a flexed position for the past year, but this is becoming more frequent and it is quite painful to straighten it out. Lastly her daughter asks to talk about the possibility of moving [place] from her current independent living facility to an assisted living facility. She has fallen 6 times in the past year and she needs closer supervision.    Review of Systems  Constitutional: Negative.   HENT: Negative.   Respiratory: Negative.   Cardiovascular: Negative.   Musculoskeletal: Positive for arthralgias.  Skin: Positive for wound.  Neurological: Negative.        Objective:   Physical Exam  Constitutional: She is oriented to person, place, and time. She appears well-developed and well-nourished.  In a wheelchair  HENT:  Small healing laceration to the lower lip. Extensive ecchymoses to the chin. Some chi tenderness. The jaws have full ROM. Teeth are intact   Cardiovascular: Normal rate, regular rhythm, normal heart sounds and intact distal pulses.   Pulmonary/Chest: Effort normal and breath sounds normal.  Musculoskeletal:  The left hand and all fingers are clear today with full ROM  Neurological: She is alert and oriented to person, place, and time.  Skin:  Left forearm has a gauze  dressing which was not removed today           Assessment & Plan:  She is recovering from injuries from a recent fall and doing well. Home health will change the forearm dressings. It sounds like she has a trigger finger, so we will refer her to Hand Surgery to evaluate. We had a long discussion about her living situation, and I strongly agreed with the daughter Drue Dun that [place] would be better off in an assisted living facility. Aviella then agreed with [place]. We will fill out an FL2 and Drue Dun will research some potential sites. A PPD was applied to day and she will return on Friday to have this read. Total time spent face to face today was 50 minutes.  Laurey Morale, MD

## 2015-12-16 ENCOUNTER — Encounter: Payer: Self-pay | Admitting: Family Medicine

## 2015-12-16 DIAGNOSIS — Z9981 Dependence on supplemental oxygen: Secondary | ICD-10-CM | POA: Diagnosis not present

## 2015-12-16 DIAGNOSIS — S51802D Unspecified open wound of left forearm, subsequent encounter: Secondary | ICD-10-CM | POA: Diagnosis not present

## 2015-12-16 DIAGNOSIS — Z7982 Long term (current) use of aspirin: Secondary | ICD-10-CM | POA: Diagnosis not present

## 2015-12-16 DIAGNOSIS — R296 Repeated falls: Secondary | ICD-10-CM | POA: Diagnosis not present

## 2015-12-16 DIAGNOSIS — W19XXXD Unspecified fall, subsequent encounter: Secondary | ICD-10-CM | POA: Diagnosis not present

## 2015-12-16 DIAGNOSIS — S01501D Unspecified open wound of lip, subsequent encounter: Secondary | ICD-10-CM | POA: Diagnosis not present

## 2015-12-16 NOTE — Telephone Encounter (Signed)
These changes were made

## 2015-12-17 DIAGNOSIS — W19XXXD Unspecified fall, subsequent encounter: Secondary | ICD-10-CM | POA: Diagnosis not present

## 2015-12-17 DIAGNOSIS — Z9981 Dependence on supplemental oxygen: Secondary | ICD-10-CM | POA: Diagnosis not present

## 2015-12-17 DIAGNOSIS — Z7982 Long term (current) use of aspirin: Secondary | ICD-10-CM | POA: Diagnosis not present

## 2015-12-17 DIAGNOSIS — R296 Repeated falls: Secondary | ICD-10-CM | POA: Diagnosis not present

## 2015-12-17 DIAGNOSIS — S01501D Unspecified open wound of lip, subsequent encounter: Secondary | ICD-10-CM | POA: Diagnosis not present

## 2015-12-17 DIAGNOSIS — S51802D Unspecified open wound of left forearm, subsequent encounter: Secondary | ICD-10-CM | POA: Diagnosis not present

## 2015-12-17 LAB — TB SKIN TEST
Induration: 0 mm
TB Skin Test: NEGATIVE

## 2015-12-21 ENCOUNTER — Telehealth: Payer: Self-pay | Admitting: Family Medicine

## 2015-12-21 DIAGNOSIS — Z7982 Long term (current) use of aspirin: Secondary | ICD-10-CM | POA: Diagnosis not present

## 2015-12-21 DIAGNOSIS — W19XXXD Unspecified fall, subsequent encounter: Secondary | ICD-10-CM | POA: Diagnosis not present

## 2015-12-21 DIAGNOSIS — S01501D Unspecified open wound of lip, subsequent encounter: Secondary | ICD-10-CM | POA: Diagnosis not present

## 2015-12-21 DIAGNOSIS — Z9981 Dependence on supplemental oxygen: Secondary | ICD-10-CM | POA: Diagnosis not present

## 2015-12-21 DIAGNOSIS — R296 Repeated falls: Secondary | ICD-10-CM | POA: Diagnosis not present

## 2015-12-21 DIAGNOSIS — S51802D Unspecified open wound of left forearm, subsequent encounter: Secondary | ICD-10-CM | POA: Diagnosis not present

## 2015-12-21 NOTE — Telephone Encounter (Signed)
error 

## 2015-12-22 DIAGNOSIS — R296 Repeated falls: Secondary | ICD-10-CM | POA: Diagnosis not present

## 2015-12-22 DIAGNOSIS — M65352 Trigger finger, left little finger: Secondary | ICD-10-CM | POA: Diagnosis not present

## 2015-12-22 DIAGNOSIS — Z7982 Long term (current) use of aspirin: Secondary | ICD-10-CM | POA: Diagnosis not present

## 2015-12-22 DIAGNOSIS — S51802D Unspecified open wound of left forearm, subsequent encounter: Secondary | ICD-10-CM | POA: Diagnosis not present

## 2015-12-22 DIAGNOSIS — Z9981 Dependence on supplemental oxygen: Secondary | ICD-10-CM | POA: Diagnosis not present

## 2015-12-22 DIAGNOSIS — S01501D Unspecified open wound of lip, subsequent encounter: Secondary | ICD-10-CM | POA: Diagnosis not present

## 2015-12-22 DIAGNOSIS — W19XXXD Unspecified fall, subsequent encounter: Secondary | ICD-10-CM | POA: Diagnosis not present

## 2015-12-23 DIAGNOSIS — R296 Repeated falls: Secondary | ICD-10-CM | POA: Diagnosis not present

## 2015-12-23 DIAGNOSIS — S51802D Unspecified open wound of left forearm, subsequent encounter: Secondary | ICD-10-CM | POA: Diagnosis not present

## 2015-12-23 DIAGNOSIS — S01501D Unspecified open wound of lip, subsequent encounter: Secondary | ICD-10-CM | POA: Diagnosis not present

## 2015-12-23 DIAGNOSIS — Z9981 Dependence on supplemental oxygen: Secondary | ICD-10-CM | POA: Diagnosis not present

## 2015-12-23 DIAGNOSIS — W19XXXD Unspecified fall, subsequent encounter: Secondary | ICD-10-CM | POA: Diagnosis not present

## 2015-12-23 DIAGNOSIS — Z7982 Long term (current) use of aspirin: Secondary | ICD-10-CM | POA: Diagnosis not present

## 2015-12-25 DIAGNOSIS — S01501D Unspecified open wound of lip, subsequent encounter: Secondary | ICD-10-CM | POA: Diagnosis not present

## 2015-12-25 DIAGNOSIS — W19XXXD Unspecified fall, subsequent encounter: Secondary | ICD-10-CM | POA: Diagnosis not present

## 2015-12-25 DIAGNOSIS — Z9981 Dependence on supplemental oxygen: Secondary | ICD-10-CM | POA: Diagnosis not present

## 2015-12-25 DIAGNOSIS — R296 Repeated falls: Secondary | ICD-10-CM | POA: Diagnosis not present

## 2015-12-25 DIAGNOSIS — S51802D Unspecified open wound of left forearm, subsequent encounter: Secondary | ICD-10-CM | POA: Diagnosis not present

## 2015-12-25 DIAGNOSIS — Z7982 Long term (current) use of aspirin: Secondary | ICD-10-CM | POA: Diagnosis not present

## 2015-12-27 DIAGNOSIS — S01501D Unspecified open wound of lip, subsequent encounter: Secondary | ICD-10-CM | POA: Diagnosis not present

## 2015-12-27 DIAGNOSIS — W19XXXD Unspecified fall, subsequent encounter: Secondary | ICD-10-CM | POA: Diagnosis not present

## 2015-12-27 DIAGNOSIS — R296 Repeated falls: Secondary | ICD-10-CM | POA: Diagnosis not present

## 2015-12-27 DIAGNOSIS — Z7982 Long term (current) use of aspirin: Secondary | ICD-10-CM | POA: Diagnosis not present

## 2015-12-27 DIAGNOSIS — Z9981 Dependence on supplemental oxygen: Secondary | ICD-10-CM | POA: Diagnosis not present

## 2015-12-27 DIAGNOSIS — S51802D Unspecified open wound of left forearm, subsequent encounter: Secondary | ICD-10-CM | POA: Diagnosis not present

## 2015-12-28 DIAGNOSIS — Z9981 Dependence on supplemental oxygen: Secondary | ICD-10-CM | POA: Diagnosis not present

## 2015-12-28 DIAGNOSIS — R296 Repeated falls: Secondary | ICD-10-CM | POA: Diagnosis not present

## 2015-12-28 DIAGNOSIS — S01501D Unspecified open wound of lip, subsequent encounter: Secondary | ICD-10-CM | POA: Diagnosis not present

## 2015-12-28 DIAGNOSIS — Z7982 Long term (current) use of aspirin: Secondary | ICD-10-CM | POA: Diagnosis not present

## 2015-12-28 DIAGNOSIS — W19XXXD Unspecified fall, subsequent encounter: Secondary | ICD-10-CM | POA: Diagnosis not present

## 2015-12-28 DIAGNOSIS — S51802D Unspecified open wound of left forearm, subsequent encounter: Secondary | ICD-10-CM | POA: Diagnosis not present

## 2015-12-31 DIAGNOSIS — Z9981 Dependence on supplemental oxygen: Secondary | ICD-10-CM | POA: Diagnosis not present

## 2015-12-31 DIAGNOSIS — Z7982 Long term (current) use of aspirin: Secondary | ICD-10-CM | POA: Diagnosis not present

## 2015-12-31 DIAGNOSIS — R296 Repeated falls: Secondary | ICD-10-CM | POA: Diagnosis not present

## 2015-12-31 DIAGNOSIS — S51802D Unspecified open wound of left forearm, subsequent encounter: Secondary | ICD-10-CM | POA: Diagnosis not present

## 2015-12-31 DIAGNOSIS — W19XXXD Unspecified fall, subsequent encounter: Secondary | ICD-10-CM | POA: Diagnosis not present

## 2015-12-31 DIAGNOSIS — S01501D Unspecified open wound of lip, subsequent encounter: Secondary | ICD-10-CM | POA: Diagnosis not present

## 2016-01-03 DIAGNOSIS — R296 Repeated falls: Secondary | ICD-10-CM | POA: Diagnosis not present

## 2016-01-03 DIAGNOSIS — S51802D Unspecified open wound of left forearm, subsequent encounter: Secondary | ICD-10-CM | POA: Diagnosis not present

## 2016-01-03 DIAGNOSIS — S01501D Unspecified open wound of lip, subsequent encounter: Secondary | ICD-10-CM | POA: Diagnosis not present

## 2016-01-03 DIAGNOSIS — W19XXXD Unspecified fall, subsequent encounter: Secondary | ICD-10-CM | POA: Diagnosis not present

## 2016-01-03 DIAGNOSIS — Z7982 Long term (current) use of aspirin: Secondary | ICD-10-CM | POA: Diagnosis not present

## 2016-01-03 DIAGNOSIS — Z9981 Dependence on supplemental oxygen: Secondary | ICD-10-CM | POA: Diagnosis not present

## 2016-01-04 DIAGNOSIS — S01501D Unspecified open wound of lip, subsequent encounter: Secondary | ICD-10-CM | POA: Diagnosis not present

## 2016-01-04 DIAGNOSIS — R296 Repeated falls: Secondary | ICD-10-CM | POA: Diagnosis not present

## 2016-01-04 DIAGNOSIS — W19XXXD Unspecified fall, subsequent encounter: Secondary | ICD-10-CM | POA: Diagnosis not present

## 2016-01-04 DIAGNOSIS — Z9981 Dependence on supplemental oxygen: Secondary | ICD-10-CM | POA: Diagnosis not present

## 2016-01-04 DIAGNOSIS — Z7982 Long term (current) use of aspirin: Secondary | ICD-10-CM | POA: Diagnosis not present

## 2016-01-04 DIAGNOSIS — S51802D Unspecified open wound of left forearm, subsequent encounter: Secondary | ICD-10-CM | POA: Diagnosis not present

## 2016-01-05 ENCOUNTER — Ambulatory Visit: Payer: Medicare Other | Admitting: Cardiovascular Disease

## 2016-01-05 DIAGNOSIS — S51802D Unspecified open wound of left forearm, subsequent encounter: Secondary | ICD-10-CM | POA: Diagnosis not present

## 2016-01-05 DIAGNOSIS — S01501D Unspecified open wound of lip, subsequent encounter: Secondary | ICD-10-CM | POA: Diagnosis not present

## 2016-01-05 DIAGNOSIS — Z9981 Dependence on supplemental oxygen: Secondary | ICD-10-CM | POA: Diagnosis not present

## 2016-01-05 DIAGNOSIS — R296 Repeated falls: Secondary | ICD-10-CM | POA: Diagnosis not present

## 2016-01-05 DIAGNOSIS — W19XXXD Unspecified fall, subsequent encounter: Secondary | ICD-10-CM | POA: Diagnosis not present

## 2016-01-05 DIAGNOSIS — Z7982 Long term (current) use of aspirin: Secondary | ICD-10-CM | POA: Diagnosis not present

## 2016-01-06 ENCOUNTER — Encounter: Payer: Self-pay | Admitting: Family Medicine

## 2016-01-06 ENCOUNTER — Other Ambulatory Visit: Payer: Self-pay | Admitting: Family Medicine

## 2016-01-06 DIAGNOSIS — N390 Urinary tract infection, site not specified: Secondary | ICD-10-CM

## 2016-01-06 DIAGNOSIS — Z7982 Long term (current) use of aspirin: Secondary | ICD-10-CM | POA: Diagnosis not present

## 2016-01-06 DIAGNOSIS — R296 Repeated falls: Secondary | ICD-10-CM | POA: Diagnosis not present

## 2016-01-06 DIAGNOSIS — S01501D Unspecified open wound of lip, subsequent encounter: Secondary | ICD-10-CM | POA: Diagnosis not present

## 2016-01-06 DIAGNOSIS — S51802D Unspecified open wound of left forearm, subsequent encounter: Secondary | ICD-10-CM | POA: Diagnosis not present

## 2016-01-06 DIAGNOSIS — Z9981 Dependence on supplemental oxygen: Secondary | ICD-10-CM | POA: Diagnosis not present

## 2016-01-06 DIAGNOSIS — W19XXXD Unspecified fall, subsequent encounter: Secondary | ICD-10-CM | POA: Diagnosis not present

## 2016-01-06 NOTE — Progress Notes (Signed)
Patient ID: Carla Friedman, female   DOB: [birth year], 82 y.o.   MRN: [id number]   Carla Friedman is seen today for CAD, HTN, and elevated lipids. She has had CABG with stents to the native LAD and SVG to OM. She has distal disease and chronic chest pain. She actually is seen by hospice. She prefers not to be seen in hospital alot. She has less chest pain off lisinopril and on Atenolol. . Her last cath was in 11/2006 and she had a non-ischemic myovue in 11/2007. She has morphine and oxygen at home. She has sig. depression.   She has significant venous insuf with marked dependant discoloration but no arterial insuf.  Ranexa has helped her quite a bit.   Now at [facility]  Increasingly forgetful   Sometimes agitated   Memory is worse on namenda No chest pain   Daughter with her today Husband may have new diagnosis of pancreatic cancer   ROS: Denies fever, malais, weight loss, blurry vision, decreased visual acuity, cough, sputum, SOB, hemoptysis, pleuritic pain, palpitaitons, heartburn, abdominal pain, melena, lower extremity edema, claudication, or rash.  All other systems reviewed and negative  General: Affect appropriate Frail elderly female  HEENT:  Large echymosis over left face  Neck supple with no adenopathy JVP normal no bruits no thyromegaly Lungs clear with no wheezing and good diaphragmatic motion Heart:  S1/S2 no murmur, no rub, gallop or click PMI normal Abdomen: benighn, BS positve, no tenderness, no AAA no bruit.  No HSM or HJR Distal pulses intact with no bruits No edema Neuro non-focal Skin warm and dry No muscular weakness   Current Outpatient Prescriptions  Medication Sig Dispense Refill  . amitriptyline (ELAVIL) 50 MG tablet Take 1 tablet (50 mg total) by mouth at bedtime. 90 tablet 3  . Ascorbic Acid (VITAMIN C) 500 MG tablet Take 500 mg by mouth daily.      Marland Kitchen aspirin EC 81 MG tablet Take 81 mg by mouth at bedtime.    Marland Kitchen atenolol (TENORMIN) 50 MG tablet  Take 1 tablet by mouth  every morning 90 tablet 1  . Calcium Carbonate (CALCIUM 600 PO) Take 600 mg by mouth daily.     . Cholecalciferol (VITAMIN D) 1000 UNITS capsule Take 1,000 Units by mouth daily.      . clopidogrel (PLAVIX) 75 MG tablet Take 1 tablet (75 mg total) by mouth daily. 90 tablet 3  . Cranberry ([employer] CRANBERRY) 500 MG CAPS Take 500 mg by mouth daily.    . isosorbide dinitrate (ISORDIL) 30 MG tablet Take 1 tablet (30 mg total) by mouth 2 (two) times daily. 180 tablet 1  . metFORMIN (GLUCOPHAGE) 1000 MG tablet TAKE ONE TABLET BY MOUTH ONCE DAILY WITH BREAKFAST 90 tablet 1  . Multiple Vitamin (MULTIVITAMIN) capsule Take 1 capsule by mouth daily.      . nitroGLYCERIN (NITROSTAT) 0.4 MG SL tablet Place 1 tablet (0.4 mg total) under the tongue every 5 (five) minutes as needed for chest pain (3 doses max). Reported on 08/31/2015 25 tablet 5  . Omega-3 Fatty Acids (FISH OIL PO) Take 1 capsule by mouth daily.    . ondansetron (ZOFRAN ODT) 4 MG disintegrating tablet Take 1 tablet (4 mg total) by mouth every 8 (eight) hours as needed for nausea. 6 tablet 0  . pravastatin (PRAVACHOL) 40 MG tablet Take 1 tablet (40 mg total) by mouth daily. 90 tablet 3  . ramipril (ALTACE) 10 MG capsule Take 1 capsule (10 mg  total) by mouth daily. 90 capsule 1  . ranolazine (RANEXA) 500 MG 12 hr tablet Take 1 tablet (500 mg total) by mouth daily. 60 tablet 11  . valACYclovir (VALTREX) 500 MG tablet Take 1 tablet (500 mg total) by mouth 2 (two) times daily as needed (fever blisters). 20 tablet 5   No current facility-administered medications for this visit.    Allergies  Codeine; Morphine; Anbesol cold sore therapy; and Other  Electrocardiogram:  SR rate 64  LAD no acute ST/ T wave changes 11/15  07/01/15  SR rate 67 LAD nonspecific ST changes   Assessment and Plan CAD:  Stable with no angina and good activity level.  Continue medical Rx Ranexa has helped  No aggressive intervention given age and advanced  dementia  UTI:  Daughter bringing sample to Dr Sarajane Jews today   Chol:  On statin labs with primary   HTN:  Well controlled.  Continue current medications and low sodium Dash type diet.    Dementia:  Advanced moved to assisted living at [facility] check is too high Stop Namenda and f/u with Dr Deliah Goody

## 2016-01-07 ENCOUNTER — Ambulatory Visit (INDEPENDENT_AMBULATORY_CARE_PROVIDER_SITE_OTHER): Payer: Medicare Other | Admitting: Cardiovascular Disease

## 2016-01-07 ENCOUNTER — Encounter: Payer: Self-pay | Admitting: Cardiovascular Disease

## 2016-01-07 ENCOUNTER — Other Ambulatory Visit (INDEPENDENT_AMBULATORY_CARE_PROVIDER_SITE_OTHER): Payer: Medicare Other

## 2016-01-07 VITALS — BP 140/70 | HR 71 | Ht 63.0 in | Wt 138.0 lb

## 2016-01-07 DIAGNOSIS — I10 Essential (primary) hypertension: Secondary | ICD-10-CM | POA: Diagnosis not present

## 2016-01-07 DIAGNOSIS — I2510 Atherosclerotic heart disease of native coronary artery without angina pectoris: Secondary | ICD-10-CM | POA: Diagnosis not present

## 2016-01-07 DIAGNOSIS — N390 Urinary tract infection, site not specified: Secondary | ICD-10-CM

## 2016-01-07 LAB — POC URINALSYSI DIPSTICK (AUTOMATED)
Bilirubin, UA: NEGATIVE
Blood, UA: NEGATIVE
GLUCOSE UA: NEGATIVE
KETONES UA: NEGATIVE
Nitrite, UA: NEGATIVE
Protein, UA: NEGATIVE
SPEC GRAV UA: 1.025
Urobilinogen, UA: 0.2
pH, UA: 5.5

## 2016-01-07 MED ORDER — CIPROFLOXACIN HCL 500 MG PO TABS: 500.0000 mg | ORAL_TABLET | Freq: Two times a day (BID) | ORAL | Status: DC

## 2016-01-07 NOTE — Patient Instructions (Addendum)
Medication Instructions:   Your physician recommends that you continue on your current medications as directed. Please refer to the Current Medication list given to you today.   If you need a refill on your cardiac medications before your next appointment, please call your pharmacy.  Labwork: NONE ORDER TODAY    Testing/Procedures: NONE ORDER TODAY   Follow-Up: ,Your physician wants you to follow-up in:  IN  [place]. You will receive a reminder letter in the mail two months in advance. If you don't receive a letter, please call our office to schedule the follow-up appointment.       Any Other Special Instructions Will Be Listed Below (If Applicable).

## 2016-01-09 LAB — URINE CULTURE
COLONY COUNT: NO GROWTH
ORGANISM ID, BACTERIA: NO GROWTH

## 2016-01-11 DIAGNOSIS — Z7982 Long term (current) use of aspirin: Secondary | ICD-10-CM | POA: Diagnosis not present

## 2016-01-11 DIAGNOSIS — R296 Repeated falls: Secondary | ICD-10-CM | POA: Diagnosis not present

## 2016-01-11 DIAGNOSIS — Z9981 Dependence on supplemental oxygen: Secondary | ICD-10-CM | POA: Diagnosis not present

## 2016-01-11 DIAGNOSIS — W19XXXD Unspecified fall, subsequent encounter: Secondary | ICD-10-CM | POA: Diagnosis not present

## 2016-01-11 DIAGNOSIS — S51802D Unspecified open wound of left forearm, subsequent encounter: Secondary | ICD-10-CM | POA: Diagnosis not present

## 2016-01-11 DIAGNOSIS — S01501D Unspecified open wound of lip, subsequent encounter: Secondary | ICD-10-CM | POA: Diagnosis not present

## 2016-01-12 ENCOUNTER — Encounter (HOSPITAL_COMMUNITY): Payer: Self-pay | Admitting: Vascular Surgery

## 2016-01-12 ENCOUNTER — Other Ambulatory Visit: Payer: Self-pay

## 2016-01-12 ENCOUNTER — Inpatient Hospital Stay (HOSPITAL_COMMUNITY)
Admission: EM | Admit: 2016-01-12 | Discharge: 2016-01-14 | DRG: 392 | Disposition: A | Discharge status: Home / Self Care | Payer: Medicare Other | Attending: Internal Medicine | Admitting: Internal Medicine

## 2016-01-12 ENCOUNTER — Other Ambulatory Visit (HOSPITAL_COMMUNITY): Payer: Self-pay

## 2016-01-12 ENCOUNTER — Emergency Department (HOSPITAL_COMMUNITY): Payer: Medicare Other

## 2016-01-12 ENCOUNTER — Observation Stay (HOSPITAL_COMMUNITY): Payer: Medicare Other

## 2016-01-12 DIAGNOSIS — K529 Noninfective gastroenteritis and colitis, unspecified: Secondary | ICD-10-CM | POA: Diagnosis not present

## 2016-01-12 DIAGNOSIS — Z66 Do not resuscitate: Secondary | ICD-10-CM | POA: Diagnosis present

## 2016-01-12 DIAGNOSIS — E1122 Type 2 diabetes mellitus with diabetic chronic kidney disease: Secondary | ICD-10-CM | POA: Diagnosis present

## 2016-01-12 DIAGNOSIS — N184 Chronic kidney disease, stage 4 (severe): Secondary | ICD-10-CM | POA: Diagnosis present

## 2016-01-12 DIAGNOSIS — Z87891 Personal history of nicotine dependence: Secondary | ICD-10-CM

## 2016-01-12 DIAGNOSIS — I2510 Atherosclerotic heart disease of native coronary artery without angina pectoris: Secondary | ICD-10-CM | POA: Diagnosis present

## 2016-01-12 DIAGNOSIS — K279 Peptic ulcer, site unspecified, unspecified as acute or chronic, without hemorrhage or perforation: Secondary | ICD-10-CM | POA: Diagnosis present

## 2016-01-12 DIAGNOSIS — E039 Hypothyroidism, unspecified: Secondary | ICD-10-CM | POA: Diagnosis present

## 2016-01-12 DIAGNOSIS — N179 Acute kidney failure, unspecified: Secondary | ICD-10-CM

## 2016-01-12 DIAGNOSIS — F0390 Unspecified dementia without behavioral disturbance: Secondary | ICD-10-CM | POA: Diagnosis present

## 2016-01-12 DIAGNOSIS — I129 Hypertensive chronic kidney disease with stage 1 through stage 4 chronic kidney disease, or unspecified chronic kidney disease: Secondary | ICD-10-CM | POA: Diagnosis present

## 2016-01-12 DIAGNOSIS — E1151 Type 2 diabetes mellitus with diabetic peripheral angiopathy without gangrene: Secondary | ICD-10-CM | POA: Diagnosis present

## 2016-01-12 DIAGNOSIS — Z7902 Long term (current) use of antithrombotics/antiplatelets: Secondary | ICD-10-CM

## 2016-01-12 DIAGNOSIS — R079 Chest pain, unspecified: Secondary | ICD-10-CM | POA: Diagnosis not present

## 2016-01-12 DIAGNOSIS — Z8711 Personal history of peptic ulcer disease: Secondary | ICD-10-CM

## 2016-01-12 DIAGNOSIS — R112 Nausea with vomiting, unspecified: Secondary | ICD-10-CM | POA: Diagnosis present

## 2016-01-12 DIAGNOSIS — E785 Hyperlipidemia, unspecified: Secondary | ICD-10-CM | POA: Diagnosis present

## 2016-01-12 DIAGNOSIS — R110 Nausea: Secondary | ICD-10-CM | POA: Diagnosis not present

## 2016-01-12 DIAGNOSIS — I10 Essential (primary) hypertension: Secondary | ICD-10-CM | POA: Diagnosis not present

## 2016-01-12 DIAGNOSIS — R0602 Shortness of breath: Secondary | ICD-10-CM | POA: Diagnosis not present

## 2016-01-12 DIAGNOSIS — E860 Dehydration: Secondary | ICD-10-CM | POA: Diagnosis present

## 2016-01-12 DIAGNOSIS — Z951 Presence of aortocoronary bypass graft: Secondary | ICD-10-CM

## 2016-01-12 DIAGNOSIS — Z7982 Long term (current) use of aspirin: Secondary | ICD-10-CM

## 2016-01-12 DIAGNOSIS — D72829 Elevated white blood cell count, unspecified: Secondary | ICD-10-CM

## 2016-01-12 DIAGNOSIS — Z955 Presence of coronary angioplasty implant and graft: Secondary | ICD-10-CM

## 2016-01-12 DIAGNOSIS — K8020 Calculus of gallbladder without cholecystitis without obstruction: Secondary | ICD-10-CM | POA: Diagnosis not present

## 2016-01-12 DIAGNOSIS — Z7984 Long term (current) use of oral hypoglycemic drugs: Secondary | ICD-10-CM

## 2016-01-12 HISTORY — DX: Dependence on supplemental oxygen: Z99.81

## 2016-01-12 HISTORY — DX: Unspecified osteoarthritis, unspecified site: M19.90

## 2016-01-12 HISTORY — DX: Repeated falls: R29.6

## 2016-01-12 HISTORY — DX: Other amnesia: R41.3

## 2016-01-12 LAB — CBC WITH DIFFERENTIAL/PLATELET
Basophils Absolute: 0 10*3/uL (ref 0.0–0.1)
Basophils Relative: 0 %
EOS ABS: 0 10*3/uL (ref 0.0–0.7)
Eosinophils Relative: 0 %
HCT: 45.3 % (ref 36.0–46.0)
Hemoglobin: 14.6 g/dL (ref 12.0–15.0)
LYMPHS ABS: 1.2 10*3/uL (ref 0.7–4.0)
LYMPHS PCT: 6 %
MCH: 27.2 pg (ref 26.0–34.0)
MCHC: 32.2 g/dL (ref 30.0–36.0)
MCV: 84.4 fL (ref 78.0–100.0)
MONO ABS: 0.7 10*3/uL (ref 0.1–1.0)
MONOS PCT: 4 %
Neutro Abs: 16.1 10*3/uL — ABNORMAL HIGH (ref 1.7–7.7)
Neutrophils Relative %: 90 %
Platelets: 228 10*3/uL (ref 150–400)
RBC: 5.37 MIL/uL — ABNORMAL HIGH (ref 3.87–5.11)
RDW: 14.7 % (ref 11.5–15.5)
WBC: 18 10*3/uL — AB (ref 4.0–10.5)

## 2016-01-12 LAB — COMPREHENSIVE METABOLIC PANEL
ALT: 17 U/L (ref 14–54)
ANION GAP: 10 (ref 5–15)
AST: 26 U/L (ref 15–41)
Albumin: 3.6 g/dL (ref 3.5–5.0)
Alkaline Phosphatase: 163 U/L — ABNORMAL HIGH (ref 38–126)
BUN: 20 mg/dL (ref 6–20)
CHLORIDE: 103 mmol/L (ref 101–111)
CO2: 24 mmol/L (ref 22–32)
CREATININE: 1.76 mg/dL — AB (ref 0.44–1.00)
Calcium: 9.4 mg/dL (ref 8.9–10.3)
GFR, EST AFRICAN AMERICAN: 31 mL/min — AB (ref >60)
GFR, EST NON AFRICAN AMERICAN: 26 mL/min — AB (ref >60)
Glucose, Bld: 258 mg/dL — ABNORMAL HIGH (ref 65–99)
POTASSIUM: 4 mmol/L (ref 3.5–5.1)
SODIUM: 137 mmol/L (ref 135–145)
Total Bilirubin: 0.6 mg/dL (ref 0.3–1.2)
Total Protein: 6.2 g/dL — ABNORMAL LOW (ref 6.5–8.1)

## 2016-01-12 LAB — GLUCOSE, CAPILLARY
GLUCOSE-CAPILLARY: 147 mg/dL — AB (ref 65–99)
Glucose-Capillary: 99 mg/dL (ref 65–99)

## 2016-01-12 LAB — LIPASE, BLOOD: LIPASE: 14 U/L (ref 11–51)

## 2016-01-12 MED ORDER — CIPROFLOXACIN HCL 500 MG PO TABS: 500.0000 mg | ORAL_TABLET | Freq: Two times a day (BID) | ORAL | Status: DC

## 2016-01-12 MED ORDER — RAMIPRIL 10 MG PO CAPS
10.0000 mg | ORAL_CAPSULE | Freq: Every day | ORAL | Status: DC
  Administered 2016-01-13 – 2016-01-14 (×2): 10 mg via ORAL
  Filled 2016-01-12 (×2): qty 1

## 2016-01-12 MED ORDER — ISOSORBIDE DINITRATE 30 MG PO TABS
30.0000 mg | ORAL_TABLET | Freq: Two times a day (BID) | ORAL | Status: DC
  Administered 2016-01-12 – 2016-01-14 (×5): 30 mg via ORAL
  Filled 2016-01-12 (×6): qty 1

## 2016-01-12 MED ORDER — TRAZODONE HCL 50 MG PO TABS
25.0000 mg | ORAL_TABLET | Freq: Every evening | ORAL | Status: DC | PRN
  Administered 2016-01-12 – 2016-01-13 (×2): 25 mg via ORAL
  Filled 2016-01-12 (×2): qty 1

## 2016-01-12 MED ORDER — PRAVASTATIN SODIUM 40 MG PO TABS
40.0000 mg | ORAL_TABLET | Freq: Every day | ORAL | Status: DC
  Administered 2016-01-13 – 2016-01-14 (×2): 40 mg via ORAL
  Filled 2016-01-12 (×2): qty 1

## 2016-01-12 MED ORDER — ACETAMINOPHEN 650 MG RE SUPP: 650.0000 mg | Freq: Four times a day (QID) | RECTAL | Status: DC | PRN

## 2016-01-12 MED ORDER — INSULIN ASPART 100 UNIT/ML ~~LOC~~ SOLN
0.0000 [IU] | Freq: Three times a day (TID) | SUBCUTANEOUS | Status: DC
  Administered 2016-01-12 – 2016-01-13 (×2): 1 [IU] via SUBCUTANEOUS

## 2016-01-12 MED ORDER — SENNOSIDES-DOCUSATE SODIUM 8.6-50 MG PO TABS: 1.0000 | ORAL_TABLET | Freq: Every evening | ORAL | Status: DC | PRN

## 2016-01-12 MED ORDER — HEPARIN SODIUM (PORCINE) 5000 UNIT/ML IJ SOLN
5000.0000 [IU] | Freq: Three times a day (TID) | INTRAMUSCULAR | Status: DC
  Administered 2016-01-12 – 2016-01-14 (×6): 5000 [IU] via SUBCUTANEOUS
  Filled 2016-01-12 (×6): qty 1

## 2016-01-12 MED ORDER — ONDANSETRON HCL 4 MG PO TABS: 4.0000 mg | ORAL_TABLET | Freq: Four times a day (QID) | ORAL | Status: DC | PRN

## 2016-01-12 MED ORDER — ONDANSETRON HCL 4 MG/2ML IJ SOLN: 4.0000 mg | Freq: Four times a day (QID) | INTRAMUSCULAR | Status: DC | PRN

## 2016-01-12 MED ORDER — ONDANSETRON HCL 4 MG/2ML IJ SOLN
4.0000 mg | Freq: Once | INTRAMUSCULAR | Status: AC
  Administered 2016-01-12: 4 mg via INTRAVENOUS
  Filled 2016-01-12: qty 2

## 2016-01-12 MED ORDER — SODIUM CHLORIDE 0.9 % IV BOLUS (SEPSIS)
1000.0000 mL | Freq: Once | INTRAVENOUS | Status: AC
  Administered 2016-01-12: 1000 mL via INTRAVENOUS

## 2016-01-12 MED ORDER — ACETAMINOPHEN 325 MG PO TABS
650.0000 mg | ORAL_TABLET | Freq: Four times a day (QID) | ORAL | Status: DC | PRN
  Administered 2016-01-13 (×2): 650 mg via ORAL
  Filled 2016-01-12 (×2): qty 2

## 2016-01-12 MED ORDER — CIPROFLOXACIN HCL 500 MG PO TABS
500.0000 mg | ORAL_TABLET | Freq: Every day | ORAL | Status: DC
  Administered 2016-01-12 – 2016-01-14 (×3): 500 mg via ORAL
  Filled 2016-01-12 (×3): qty 1

## 2016-01-12 MED ORDER — SODIUM CHLORIDE 0.9 % IV SOLN
INTRAVENOUS | Status: DC
  Administered 2016-01-12 – 2016-01-13 (×2): via INTRAVENOUS
  Administered 2016-01-13: 1 mL via INTRAVENOUS
  Administered 2016-01-14: 09:00:00 via INTRAVENOUS

## 2016-01-12 MED ORDER — AMITRIPTYLINE HCL 50 MG PO TABS
50.0000 mg | ORAL_TABLET | Freq: Every day | ORAL | Status: DC
  Administered 2016-01-12 – 2016-01-13 (×2): 50 mg via ORAL
  Filled 2016-01-12 (×2): qty 1

## 2016-01-12 MED ORDER — ATENOLOL 50 MG PO TABS
50.0000 mg | ORAL_TABLET | Freq: Every day | ORAL | Status: DC
  Administered 2016-01-13 – 2016-01-14 (×2): 50 mg via ORAL
  Filled 2016-01-12 (×2): qty 1

## 2016-01-12 MED ORDER — ASPIRIN EC 81 MG PO TBEC
81.0000 mg | DELAYED_RELEASE_TABLET | Freq: Every day | ORAL | Status: DC
  Administered 2016-01-12 – 2016-01-13 (×2): 81 mg via ORAL
  Filled 2016-01-12 (×2): qty 1

## 2016-01-12 MED ORDER — METRONIDAZOLE IN NACL 5-0.79 MG/ML-% IV SOLN
500.0000 mg | Freq: Three times a day (TID) | INTRAVENOUS | Status: DC
  Administered 2016-01-12 – 2016-01-14 (×6): 500 mg via INTRAVENOUS
  Filled 2016-01-12 (×7): qty 100

## 2016-01-12 MED ORDER — METRONIDAZOLE IN NACL 5-0.79 MG/ML-% IV SOLN
500.0000 mg | Freq: Two times a day (BID) | INTRAVENOUS | Status: DC
Start: 2016-01-12 — End: 2016-01-12
  Filled 2016-01-12: qty 100

## 2016-01-12 MED ORDER — CLOPIDOGREL BISULFATE 75 MG PO TABS
75.0000 mg | ORAL_TABLET | Freq: Every day | ORAL | Status: DC
  Administered 2016-01-13 – 2016-01-14 (×2): 75 mg via ORAL
  Filled 2016-01-12 (×2): qty 1

## 2016-01-12 NOTE — ED Notes (Signed)
[id number] attempted report

## 2016-01-12 NOTE — ED Notes (Signed)
[facility] [phone] Headings cell ride home

## 2016-01-12 NOTE — ED Provider Notes (Signed)
CSN: [id number]     Arrival date & time 01/12/16  [id number] History   First MD Initiated Contact with Patient 01/12/16 [phone]     Chief Complaint  Patient presents with  . Emesis     (Consider location/radiation/quality/duration/timing/severity/associated sxs/prior Treatment) Patient is a 82 y.o. female presenting with vomiting. The history is provided by the patient (Patient complains of vomiting and diarrhea for 2 days. Mild abdominal discomfort).  Emesis Severity:  Moderate Timing:  Constant Quality:  Bilious material Able to tolerate:  Liquids Progression:  Unchanged Chronicity:  New Recent urination:  Normal Associated symptoms: diarrhea   Associated symptoms: no abdominal pain and no headaches     Past Medical History  Diagnosis Date  . Coronary artery disease     sees Dr. Johnsie Cancel, in Hospice for this   . Chest pain, atypical   . PVD (peripheral vascular disease) ([place])   . Hypertension   . Dyslipidemia   . Diabetes mellitus type II   . Peptic ulcer disease   . Depression   . Diabetic neuropathy ([place])   . Hypothyroidism   . Back pain     low  . Cancer, skin, squamous cell     sees Dr. Lois Huxley at [facility] dermatology  . Confusion    Past Surgical History  Procedure Laterality Date  . Appendectomy    . Tonsillectomy    . Coronary artery bypass graft      stent placement 2001  . Cataract extraction    . Colonoscopy  02-20-08    per Dr. Olevia Perches, diverticulosis, no polyps, repeat in 10 yrs    Family History  Problem Relation Age of Onset  . Emphysema Mother   . Heart attack Father   . Diabetes Father   . Breast cancer Maternal Grandmother   . Diabetes Paternal Grandfather   . Hypertension      family history   Social History  Substance Use Topics  . Smoking status: Former Research scientist (life sciences)  . Smokeless tobacco: Never Used  . Alcohol Use: No   OB History    No data available     Review of Systems  Constitutional: Negative for appetite change and fatigue.   HENT: Negative for congestion, ear discharge and sinus pressure.   Eyes: Negative for discharge.  Respiratory: Negative for cough.   Cardiovascular: Negative for chest pain.  Gastrointestinal: Positive for vomiting and diarrhea. Negative for abdominal pain.  Genitourinary: Negative for frequency and hematuria.  Musculoskeletal: Negative for back pain.  Skin: Negative for rash.  Neurological: Negative for seizures and headaches.  Psychiatric/Behavioral: Negative for hallucinations.      Allergies  Codeine; Morphine; Anbesol cold sore therapy; and Other  Home Medications   Prior to Admission medications   Medication Sig Start Date End Date Taking? Authorizing Provider  amitriptyline (ELAVIL) 50 MG tablet Take 1 tablet (50 mg total) by mouth at bedtime. 08/23/15  Yes Laurey Morale, MD  Ascorbic Acid (VITAMIN C) 500 MG tablet Take 500 mg by mouth daily.     Yes Historical Provider, MD  aspirin EC 81 MG tablet Take 81 mg by mouth at bedtime.   Yes Historical Provider, MD  atenolol (TENORMIN) 50 MG tablet Take 1 tablet by mouth  every morning 11/09/15  Yes Laurey Morale, MD  Calcium Carbonate (CALCIUM 600 PO) Take 600 mg by mouth daily.    Yes Historical Provider, MD  Cholecalciferol (VITAMIN D) 1000 UNITS capsule Take 1,000 Units by mouth daily.  Yes Historical Provider, MD  ciprofloxacin (CIPRO) 500 MG tablet Take 1 tablet (500 mg total) by mouth 2 (two) times daily. 01/07/16  Yes Laurey Morale, MD  clopidogrel (PLAVIX) 75 MG tablet Take 1 tablet (75 mg total) by mouth daily. 10/01/15  Yes Laurey Morale, MD  Cranberry ([employer] CRANBERRY) 500 MG CAPS Take 500 mg by mouth daily.   Yes Historical Provider, MD  isosorbide dinitrate (ISORDIL) 30 MG tablet Take 1 tablet (30 mg total) by mouth 2 (two) times daily. 06/04/15  Yes Laurey Morale, MD  metFORMIN (GLUCOPHAGE) 1000 MG tablet TAKE ONE TABLET BY MOUTH ONCE DAILY WITH BREAKFAST 08/23/15  Yes Laurey Morale, MD  Multiple Vitamin (MULTIVITAMIN)  capsule Take 1 capsule by mouth daily.     Yes Historical Provider, MD  nitroGLYCERIN (NITROSTAT) 0.4 MG SL tablet Place 1 tablet (0.4 mg total) under the tongue every 5 (five) minutes as needed for chest pain (3 doses max). Reported on 08/31/2015 10/04/15  Yes Josue Hector, MD  Omega-3 Fatty Acids (FISH OIL PO) Take 1 capsule by mouth daily.   Yes Historical Provider, MD  ondansetron (ZOFRAN ODT) 4 MG disintegrating tablet Take 1 tablet (4 mg total) by mouth every 8 (eight) hours as needed for nausea. 10/31/14  Yes Tanna Furry, MD  pravastatin (PRAVACHOL) 40 MG tablet Take 1 tablet (40 mg total) by mouth daily. 10/01/15  Yes Laurey Morale, MD  ramipril (ALTACE) 10 MG capsule Take 1 capsule (10 mg total) by mouth daily. 09/28/15  Yes Laurey Morale, MD  ranolazine (RANEXA) 500 MG 12 hr tablet Take 1 tablet (500 mg total) by mouth daily. 08/31/15  Yes Laurey Morale, MD  valACYclovir (VALTREX) 500 MG tablet Take 1 tablet (500 mg total) by mouth 2 (two) times daily as needed (fever blisters). 06/08/14  Yes Jessica U Vann, DO   BP 113/76 mmHg  Pulse 75  Temp(Src) 97.8 F (36.6 C) (Oral)  Resp 16  SpO2 99% Physical Exam  Constitutional: She is oriented to person, place, and time. She appears well-developed.  HENT:  Head: Normocephalic.  Eyes: Conjunctivae and EOM are normal. No scleral icterus.  Neck: Neck supple. No thyromegaly present.  Cardiovascular: Normal rate and regular rhythm.  Exam reveals no gallop and no friction rub.   No murmur heard. Pulmonary/Chest: No stridor. She has no wheezes. She has no rales. She exhibits no tenderness.  Abdominal: She exhibits no distension. There is tenderness. There is no rebound.  Mild tenderness throughout abdomen  Musculoskeletal: Normal range of motion. She exhibits no edema.  Lymphadenopathy:    She has no cervical adenopathy.  Neurological: She is oriented to person, place, and time. She exhibits normal muscle tone. Coordination normal.  Skin: No rash  noted. No erythema.  Psychiatric: She has a normal mood and affect. Her behavior is normal.    ED Course  Procedures (including critical care time) Labs Review Labs Reviewed  CBC WITH DIFFERENTIAL/PLATELET - Abnormal; Notable for the following:    WBC 18.0 (*)    RBC 5.37 (*)    Neutro Abs 16.1 (*)    All other components within normal limits  COMPREHENSIVE METABOLIC PANEL - Abnormal; Notable for the following:    Glucose, Bld 258 (*)    Creatinine, Ser 1.76 (*)    Total Protein 6.2 (*)    Alkaline Phosphatase 163 (*)    GFR calc non Af Amer 26 (*)    GFR calc Af Wyvonnia Lora  31 (*)    All other components within normal limits  C DIFFICILE QUICK SCREEN W PCR REFLEX  LIPASE, BLOOD    Imaging Review Ct Abdomen Pelvis Wo Contrast  01/12/2016  CLINICAL DATA:  Nausea, vomiting the past 24 hours EXAM: CT ABDOMEN AND PELVIS WITHOUT CONTRAST TECHNIQUE: Multidetector CT imaging of the abdomen and pelvis was performed following the standard protocol without IV contrast. COMPARISON:  09/16/2014 FINDINGS: Lower chest: Heart is borderline in size. No confluent opacities in the lung bases. No effusions. Hepatobiliary: Small layering gallstones within the gallbladder. No visible focal hepatic abnormality on this unenhanced study. Pancreas: No focal abnormality or ductal dilatation. Spleen: No focal abnormality.  Normal size. Adrenals/Urinary Tract: No adrenal abnormality. No focal renal abnormality. No stones or hydronephrosis. Urinary bladder is unremarkable. Stomach/Bowel: There is apparent wall thickening in the descending colon and proximal sigmoid colon with surrounding inflammatory change. There are scattered diverticula, but the process appears more diffuse than diverticulitis and likely reflects infectious or inflammatory colitis. No evidence of bowel obstruction. Stomach and small bowel decompressed, grossly unremarkable. Vascular/Lymphatic: Dense aortic and iliac calcifications. Dense splenic artery  calcifications. No aneurysm or adenopathy. Reproductive: Uterus and adnexa unremarkable.  No mass. Other: Moderate free fluid in the pelvis and around the liver. Musculoskeletal: No acute bony abnormality. Degenerative disc and facet disease in the lumbar spine. IMPRESSION: Wall thickening throughout the descending colon and into the proximal sigmoid colon with surrounding inflammatory change most compatible with colitis. Moderate free fluid in the abdomen and pelvis. Cholelithiasis. Aortic atherosclerosis. Electronically Signed   By: Rolm Baptise M.D.   On: 01/12/2016 11:44   I have personally reviewed and evaluated these images and lab results as part of my medical decision-making.   EKG Interpretation None      MDM   Final diagnoses:  Colitis    Patient with colitis will be admitted to medicine   Milton Ferguson, MD 01/12/16 1438

## 2016-01-12 NOTE — ED Notes (Signed)
Pt reports to the ED for eval of N/V x 24 hours. No active vomiting en route. Pt currently complaining of some nausea and lower abd soreness. Denies any blood in her emesis. Had diarrhea 2 days ago but it is currently resolved.VSS en route. Pt denies any CP or SOB. Pt A&O at baseline, resp e/u, and skin warm, pale, and dry.

## 2016-01-12 NOTE — H&P (Signed)
History and Physical    Carla Friedman [id number] DOB: 12-Jun-1934 DOA: 01/12/2016   PCP: Laurey Morale, MD   [facility]   Chief Complaint: Nausea and vomiting   HPI: Carla Friedman is a 82 y.o. female with medical history significant for HTN, HLD, CAD, PVD, DM2, PUD, mild  Dementia, recent ED visit for a  Mechanical fall  Presenting with acute episode of nausea and vomiting preceded by diarrhea 2 days,ago, not present at this time.  She denies any food poisoning. Was treated with antibiotics about 1 month ago. At this time, her vomiting subsided, but continues to experience some nausea controlled with Zofran. Denies any sick contacts. Denies any fever, chills or night sweats. No chest pain or shortness of breath. Denies any dizziness or vertigo. She denies any rashes or cuts.She is now feeling better after IV fluids were given.    ED Course:  BP 134/51 mmHg  Pulse 70  Temp(Src) 97.8 F (36.6 C) (Oral)  Resp 15  SpO2 98%  Cr 1.76, Anion gap 10  Bili 0.6  Hb 14.6, WBX 18  UA and  Blood culture pending  Glu 258   CT abdomen and pelvis Wall thickening throughout the descending colon and into the proximal sigmoid colon with surrounding inflammatory change most compatible with colitis.Moderate free fluid in the abdomen and pelvis.   Review of Systems: As per HPI otherwise 10 point review of systems negative.   Past Medical History  Diagnosis Date  . Coronary artery disease     sees Dr. Johnsie Cancel, in Hospice for this   . Chest pain, atypical   . PVD (peripheral vascular disease) ([place])   . Hypertension   . Dyslipidemia   . Diabetes mellitus type II   . Peptic ulcer disease   . Depression   . Diabetic neuropathy ([place])   . Hypothyroidism   . Back pain     low  . Cancer, skin, squamous cell     sees Dr. Lois Huxley at [facility] dermatology  . Confusion     Past Surgical History  Procedure Laterality Date  . Appendectomy    .  Tonsillectomy    . Coronary artery bypass graft      stent placement 2001  . Cataract extraction    . Colonoscopy  02-20-08    per Dr. Olevia Perches, diverticulosis, no polyps, repeat in 10 yrs     Social History Social History   Social History  . Marital Status: Widowed    Spouse Name: N/A  . Number of Children: 4  . Years of Education: N/A   Occupational History  . RETIRED    Social History Main Topics  . Smoking status: Former Research scientist (life sciences)  . Smokeless tobacco: Never Used  . Alcohol Use: No  . Drug Use: No  . Sexual Activity: Not on file   Other Topics Concern  . Not on file   Social History Narrative     Allergies  Allergen Reactions  . Codeine Other (See Comments)    Respiratory distress  . Morphine Other (See Comments)    Respiratory distress  . Anbesol Cold Sore Therapy [Lip Medex]     Caused lips to swell  . Other Other (See Comments)    Vicryl Stitches  "body rejects them"    Family History  Problem Relation Age of Onset  . Emphysema Mother   . Heart attack Father   . Diabetes Father   . Breast  cancer Maternal Grandmother   . Diabetes Paternal Grandfather   . Hypertension      family history      Prior to Admission medications   Medication Sig Start Date End Date Taking? Authorizing Provider  amitriptyline (ELAVIL) 50 MG tablet Take 1 tablet (50 mg total) by mouth at bedtime. 08/23/15  Yes Laurey Morale, MD  Ascorbic Acid (VITAMIN C) 500 MG tablet Take 500 mg by mouth daily.     Yes Historical Provider, MD  aspirin EC 81 MG tablet Take 81 mg by mouth at bedtime.   Yes Historical Provider, MD  atenolol (TENORMIN) 50 MG tablet Take 1 tablet by mouth  every morning 11/09/15  Yes Laurey Morale, MD  Calcium Carbonate (CALCIUM 600 PO) Take 600 mg by mouth daily.    Yes Historical Provider, MD  Cholecalciferol (VITAMIN D) 1000 UNITS capsule Take 1,000 Units by mouth daily.     Yes Historical Provider, MD  ciprofloxacin (CIPRO) 500 MG tablet Take 1 tablet (500 mg  total) by mouth 2 (two) times daily. 01/07/16  Yes Laurey Morale, MD  clopidogrel (PLAVIX) 75 MG tablet Take 1 tablet (75 mg total) by mouth daily. 10/01/15  Yes Laurey Morale, MD  Cranberry ([employer] CRANBERRY) 500 MG CAPS Take 500 mg by mouth daily.   Yes Historical Provider, MD  isosorbide dinitrate (ISORDIL) 30 MG tablet Take 1 tablet (30 mg total) by mouth 2 (two) times daily. 06/04/15  Yes Laurey Morale, MD  metFORMIN (GLUCOPHAGE) 1000 MG tablet TAKE ONE TABLET BY MOUTH ONCE DAILY WITH BREAKFAST 08/23/15  Yes Laurey Morale, MD  Multiple Vitamin (MULTIVITAMIN) capsule Take 1 capsule by mouth daily.     Yes Historical Provider, MD  nitroGLYCERIN (NITROSTAT) 0.4 MG SL tablet Place 1 tablet (0.4 mg total) under the tongue every 5 (five) minutes as needed for chest pain (3 doses max). Reported on 08/31/2015 10/04/15  Yes Josue Hector, MD  Omega-3 Fatty Acids (FISH OIL PO) Take 1 capsule by mouth daily.   Yes Historical Provider, MD  ondansetron (ZOFRAN ODT) 4 MG disintegrating tablet Take 1 tablet (4 mg total) by mouth every 8 (eight) hours as needed for nausea. 10/31/14  Yes Tanna Furry, MD  pravastatin (PRAVACHOL) 40 MG tablet Take 1 tablet (40 mg total) by mouth daily. 10/01/15  Yes Laurey Morale, MD  ramipril (ALTACE) 10 MG capsule Take 1 capsule (10 mg total) by mouth daily. 09/28/15  Yes Laurey Morale, MD  ranolazine (RANEXA) 500 MG 12 hr tablet Take 1 tablet (500 mg total) by mouth daily. 08/31/15  Yes Laurey Morale, MD  valACYclovir (VALTREX) 500 MG tablet Take 1 tablet (500 mg total) by mouth 2 (two) times daily as needed (fever blisters). 06/08/14  Yes Geradine Girt, DO    Physical Exam:    Filed Vitals:   01/12/16 1146 01/12/16 1215 01/12/16 1230 01/12/16 1305  BP: 127/52 136/56 117/50 134/51  Pulse: 69 69 71 70  Temp:      TempSrc:      Resp: 16  16 15   SpO2: 100% 100% 97% 98%       Constitutional: NAD, calm, comfortable Filed Vitals:   01/12/16 1146 01/12/16 1215 01/12/16 1230  01/12/16 1305  BP: 127/52 136/56 117/50 134/51  Pulse: 69 69 71 70  Temp:      TempSrc:      Resp: 16  16 15   SpO2: 100% 100% 97% 98%  Eyes: PERRL, lids and conjunctivae normal ENMT: Mucous membranes are moist. Posterior pharynx clear of any exudate or lesions.Normal dentition.  Neck: normal, supple, no masses, no thyromegaly Respiratory: clear to auscultation bilaterally, no wheezing, no crackles. Normal respiratory effort. No accessory muscle use.  Cardiovascular: Regular rate and rhythm,  Soft murmurs / rubs / gallops. No extremity edema. 2+ pedal pulses. No carotid bruits.  Abdomen: no tenderness, no masses palpated. No hepatosplenomegaly. Bowel sounds positive.  Musculoskeletal: no clubbing / cyanosis.No contractures. Normal muscle tone.  Skin: multiple areas of bruising on her arms  Neurologic: CN 2-12 grossly intact except for mild dementia . Sensation intact, DTR normal. Strength 5/5 in all 4.  Psychiatric: Normal judgment and insight. Alert and oriented x 3. Normal mood.     Labs on Admission: I have personally reviewed following labs and imaging studies  CBC:  Recent Labs Lab 01/12/16 1018  WBC 18.0*  NEUTROABS 16.1*  HGB 14.6  HCT 45.3  MCV 84.4  PLT [id number]    Basic Metabolic Panel:  Recent Labs Lab 01/12/16 1018  NA 137  K 4.0  CL 103  CO2 24  GLUCOSE 258*  BUN 20  CREATININE 1.76*  CALCIUM 9.4    GFR: Estimated Creatinine Clearance: 21.4 mL/min (by C-G formula based on Cr of 1.76).  Liver Function Tests:  Recent Labs Lab 01/12/16 1018  AST 26  ALT 17  ALKPHOS 163*  BILITOT 0.6  PROT 6.2*  ALBUMIN 3.6    Recent Labs Lab 01/12/16 1018  LIPASE 14   No results for input(s): AMMONIA in the last 168 hours.  Coagulation Profile: No results for input(s): INR, PROTIME in the last 168 hours.  Cardiac Enzymes: No results for input(s): CKTOTAL, CKMB, CKMBINDEX, TROPONINI in the last 168 hours.  BNP (last 3 results) No results for  input(s): PROBNP in the last 8760 hours.  HbA1C: No results for input(s): HGBA1C in the last 72 hours.  CBG: No results for input(s): GLUCAP in the last 168 hours.  Lipid Profile: No results for input(s): CHOL, HDL, LDLCALC, TRIG, CHOLHDL, LDLDIRECT in the last 72 hours.  Thyroid Function Tests: No results for input(s): TSH, T4TOTAL, FREET4, T3FREE, THYROIDAB in the last 72 hours.  Anemia Panel: No results for input(s): VITAMINB12, FOLATE, FERRITIN, TIBC, IRON, RETICCTPCT in the last 72 hours.  Urine analysis:    Component Value Date/Time   COLORURINE AMBER* 08/13/2014 [place] 08/13/2014 1643   LABSPEC 1.016 08/13/2014 1643   PHURINE 5.5 08/13/2014 1643   GLUCOSEU NEGATIVE 08/13/2014 1643   HGBUR NEGATIVE 08/13/2014 1643   BILIRUBINUR n 01/07/2016 1032   BILIRUBINUR SMALL* 08/13/2014 1643   KETONESUR NEGATIVE 08/13/2014 1643   PROTEINUR n 01/07/2016 1032   PROTEINUR NEGATIVE 08/13/2014 1643   UROBILINOGEN 0.2 01/07/2016 1032   UROBILINOGEN 0.2 08/13/2014 1643   NITRITE n 01/07/2016 1032   NITRITE NEGATIVE 08/13/2014 1643   LEUKOCYTESUR small (1+)* 01/07/2016 1032    Sepsis Labs: @LABRCNTIP (procalcitonin:4,lacticidven:4) ) Recent Results (from the past 240 hour(s))  Urine culture     Status: None   Collection Time: 01/07/16  1:43 PM  Result Value Ref Range Status   Colony Count NO GROWTH  Final   Organism ID, Bacteria NO GROWTH  Final     Radiological Exams on Admission: Ct Abdomen Pelvis Wo Contrast  01/12/2016  CLINICAL DATA:  Nausea, vomiting the past 24 hours EXAM: CT ABDOMEN AND PELVIS WITHOUT CONTRAST TECHNIQUE: Multidetector CT imaging of the abdomen and  pelvis was performed following the standard protocol without IV contrast. COMPARISON:  09/16/2014 FINDINGS: Lower chest: Heart is borderline in size. No confluent opacities in the lung bases. No effusions. Hepatobiliary: Small layering gallstones within the gallbladder. No visible focal  hepatic abnormality on this unenhanced study. Pancreas: No focal abnormality or ductal dilatation. Spleen: No focal abnormality.  Normal size. Adrenals/Urinary Tract: No adrenal abnormality. No focal renal abnormality. No stones or hydronephrosis. Urinary bladder is unremarkable. Stomach/Bowel: There is apparent wall thickening in the descending colon and proximal sigmoid colon with surrounding inflammatory change. There are scattered diverticula, but the process appears more diffuse than diverticulitis and likely reflects infectious or inflammatory colitis. No evidence of bowel obstruction. Stomach and small bowel decompressed, grossly unremarkable. Vascular/Lymphatic: Dense aortic and iliac calcifications. Dense splenic artery calcifications. No aneurysm or adenopathy. Reproductive: Uterus and adnexa unremarkable.  No mass. Other: Moderate free fluid in the pelvis and around the liver. Musculoskeletal: No acute bony abnormality. Degenerative disc and facet disease in the lumbar spine. IMPRESSION: Wall thickening throughout the descending colon and into the proximal sigmoid colon with surrounding inflammatory change most compatible with colitis. Moderate free fluid in the abdomen and pelvis. Cholelithiasis. Aortic atherosclerosis. Electronically Signed   By: Rolm Baptise M.D.   On: 01/12/2016 11:44    EKG: Independently reviewed.  Assessment/Plan Active Problems:   * No active hospital problems. *   Nausea and vomiting. Likely due to colitis per CT abdomen and pelvis :Wall thickening throughout the descending colon and into the proximal sigmoid colon with surrounding inflammatory change   Afebrile, nontoxic appearing. Exam benign. Leukocytosis noted with WBC 18 . No abdominal pain. Denies any GIB at this time  -Admit to telemetry -Bowel rest -Scheduled Zofran -Gentle IV fluids -Advance diet as tolerated  Flagyl and Cipro  Continue PPI   Leukocytosis, likely reactive, related to underlying  infection. WBC 18. Afebrile  Antibiotics   IVF  Cultures Patient on Cipro   Repeat CBC in AM  Chronic kidney disease stage  IV baseline creatinine 1, now 1.76 in the setting of GI losses  Lab Results  Component Value Date   CREATININE 1.76* 01/12/2016   CREATININE 1.01* 12/06/2015   CREATININE 1.07 08/31/2015  IVF Repeat CMET in am   Hypertension BP 106/65 mmHg Controlled Continue home anti-hypertensive medications unless BP is below 100/50  Continue IVF   Hyperlipidemia Continue home statins   Type II Diabetes with neuropathy Current blood sugar level is 258 Lab Results  Component Value Date   HGBA1C 5.7 08/31/2015  Hold home oral diabetic medications.  SSI Heart healthy carb modified diet once able to eat  Continue amitriptyline   Peripheral Vascular disease/ CAD s/p CABG . No acute issues at this time Continue ASA and Plavix     DVT prophylaxis:  Heparin sq  Code Status:  DNR Family Communication:  Discussed with patient Disposition Plan: Expect patient to be discharged to home after condition improves Consults called:    None Admission status: Medsurg  obs    Lexis Potenza E, PA-C [facility]   If 7PM-7AM, please contact night-coverage [email or web address] Password [email or web address]  01/12/2016, 2:00 PM

## 2016-01-13 DIAGNOSIS — I10 Essential (primary) hypertension: Secondary | ICD-10-CM | POA: Diagnosis not present

## 2016-01-13 DIAGNOSIS — Z7982 Long term (current) use of aspirin: Secondary | ICD-10-CM | POA: Diagnosis not present

## 2016-01-13 DIAGNOSIS — R112 Nausea with vomiting, unspecified: Secondary | ICD-10-CM | POA: Diagnosis present

## 2016-01-13 DIAGNOSIS — K529 Noninfective gastroenteritis and colitis, unspecified: Principal | ICD-10-CM

## 2016-01-13 DIAGNOSIS — N179 Acute kidney failure, unspecified: Secondary | ICD-10-CM | POA: Diagnosis not present

## 2016-01-13 DIAGNOSIS — Z66 Do not resuscitate: Secondary | ICD-10-CM | POA: Diagnosis present

## 2016-01-13 DIAGNOSIS — I2510 Atherosclerotic heart disease of native coronary artery without angina pectoris: Secondary | ICD-10-CM | POA: Diagnosis present

## 2016-01-13 DIAGNOSIS — E1122 Type 2 diabetes mellitus with diabetic chronic kidney disease: Secondary | ICD-10-CM | POA: Diagnosis present

## 2016-01-13 DIAGNOSIS — E860 Dehydration: Secondary | ICD-10-CM | POA: Diagnosis present

## 2016-01-13 DIAGNOSIS — N184 Chronic kidney disease, stage 4 (severe): Secondary | ICD-10-CM | POA: Diagnosis present

## 2016-01-13 DIAGNOSIS — F0390 Unspecified dementia without behavioral disturbance: Secondary | ICD-10-CM | POA: Diagnosis present

## 2016-01-13 DIAGNOSIS — E1151 Type 2 diabetes mellitus with diabetic peripheral angiopathy without gangrene: Secondary | ICD-10-CM | POA: Diagnosis present

## 2016-01-13 DIAGNOSIS — Z7902 Long term (current) use of antithrombotics/antiplatelets: Secondary | ICD-10-CM | POA: Diagnosis not present

## 2016-01-13 DIAGNOSIS — I129 Hypertensive chronic kidney disease with stage 1 through stage 4 chronic kidney disease, or unspecified chronic kidney disease: Secondary | ICD-10-CM | POA: Diagnosis present

## 2016-01-13 DIAGNOSIS — Z951 Presence of aortocoronary bypass graft: Secondary | ICD-10-CM | POA: Diagnosis not present

## 2016-01-13 DIAGNOSIS — Z955 Presence of coronary angioplasty implant and graft: Secondary | ICD-10-CM | POA: Diagnosis not present

## 2016-01-13 DIAGNOSIS — E785 Hyperlipidemia, unspecified: Secondary | ICD-10-CM | POA: Diagnosis present

## 2016-01-13 DIAGNOSIS — E039 Hypothyroidism, unspecified: Secondary | ICD-10-CM | POA: Diagnosis present

## 2016-01-13 DIAGNOSIS — Z87891 Personal history of nicotine dependence: Secondary | ICD-10-CM | POA: Diagnosis not present

## 2016-01-13 DIAGNOSIS — Z7984 Long term (current) use of oral hypoglycemic drugs: Secondary | ICD-10-CM | POA: Diagnosis not present

## 2016-01-13 DIAGNOSIS — Z8711 Personal history of peptic ulcer disease: Secondary | ICD-10-CM | POA: Diagnosis not present

## 2016-01-13 LAB — COMPREHENSIVE METABOLIC PANEL
ALBUMIN: 2.7 g/dL — AB (ref 3.5–5.0)
ALT: 11 U/L — AB (ref 14–54)
AST: 19 U/L (ref 15–41)
Alkaline Phosphatase: 87 U/L (ref 38–126)
Anion gap: 7 (ref 5–15)
BUN: 22 mg/dL — AB (ref 6–20)
CHLORIDE: 107 mmol/L (ref 101–111)
CO2: 21 mmol/L — AB (ref 22–32)
CREATININE: 1.37 mg/dL — AB (ref 0.44–1.00)
Calcium: 7.9 mg/dL — ABNORMAL LOW (ref 8.9–10.3)
GFR calc Af Amer: 41 mL/min — ABNORMAL LOW (ref >60)
GFR, EST NON AFRICAN AMERICAN: 36 mL/min — AB (ref >60)
Glucose, Bld: 145 mg/dL — ABNORMAL HIGH (ref 65–99)
POTASSIUM: 4.1 mmol/L (ref 3.5–5.1)
SODIUM: 135 mmol/L (ref 135–145)
Total Bilirubin: 0.7 mg/dL (ref 0.3–1.2)
Total Protein: 4.9 g/dL — ABNORMAL LOW (ref 6.5–8.1)

## 2016-01-13 LAB — GLUCOSE, CAPILLARY
GLUCOSE-CAPILLARY: 95 mg/dL (ref 65–99)
GLUCOSE-CAPILLARY: 132 mg/dL — AB (ref 65–99)
Glucose-Capillary: 135 mg/dL — ABNORMAL HIGH (ref 65–99)
Glucose-Capillary: 108 mg/dL — ABNORMAL HIGH (ref 65–99)

## 2016-01-13 LAB — URINALYSIS, ROUTINE W REFLEX MICROSCOPIC
BILIRUBIN URINE: NEGATIVE
Glucose, UA: NEGATIVE mg/dL
Ketones, ur: 15 mg/dL — AB
NITRITE: NEGATIVE
PH: 5 (ref 5.0–8.0)
Protein, ur: NEGATIVE mg/dL
SPECIFIC GRAVITY, URINE: 1.022 (ref 1.005–1.030)

## 2016-01-13 LAB — URINE MICROSCOPIC-ADD ON

## 2016-01-13 LAB — HEMOGLOBIN A1C
HEMOGLOBIN A1C: 5.6 % (ref 4.8–5.6)
Mean Plasma Glucose: 114 mg/dL

## 2016-01-13 LAB — CBC
HEMATOCRIT: 32.8 % — AB (ref 36.0–46.0)
Hemoglobin: 10.6 g/dL — ABNORMAL LOW (ref 12.0–15.0)
MCH: 27.2 pg (ref 26.0–34.0)
MCHC: 32.3 g/dL (ref 30.0–36.0)
MCV: 84.1 fL (ref 78.0–100.0)
Platelets: 187 10*3/uL (ref 150–400)
RBC: 3.9 MIL/uL (ref 3.87–5.11)
RDW: 15.1 % (ref 11.5–15.5)
WBC: 12.5 10*3/uL — AB (ref 4.0–10.5)

## 2016-01-13 NOTE — Progress Notes (Addendum)
Inpatient Diabetes Program Recommendations  AACE/ADA: New Consensus Statement on Inpatient Glycemic Control (2015)  Target Ranges:  Prepandial:   less than 140 mg/dL      Peak postprandial:   less than 180 mg/dL (1-2 hours)      Critically ill patients:  140 - 180 mg/dL   Lab Results  Component Value Date   GLUCAP 135* 01/13/2016   HGBA1C 5.6 01/12/2016    Review of Glycemic Control  Results for KYRSTIE, THI (MRN [id number]) as of 01/13/2016 12:20  Ref. Range 01/12/2016 17:24 01/12/2016 21:48 01/13/2016 07:55  Glucose-Capillary Latest Ref Range: 65-99 mg/dL 147 (H) 99 135 (H)    Diabetes history: Type 2 Outpatient Diabetes medications: Glucophage 1000mg  qam Current orders for Inpatient glycemic control: Novolog sensitive correction (0-9units) tid with meals  Inpatient Diabetes Program Recommendations:  Agree with current orders for blood sugar managment   Gentry Fitz, RN, [place], [place], CDE Diabetes Coordinator Inpatient Diabetes Program  [phone] (Team Pager) [phone] ([place]) 01/13/2016 12:22 PM

## 2016-01-13 NOTE — Progress Notes (Signed)
[place] [facility] responded to a consult where the patient requested prayer. I introduced myself and asked how I could be of service. The patient, while grateful for my visit could not remember making the request. She complains of "unclear" thinking today and was having difficulty remembering the name of her assistant living facility. She requested prayer which I provided. I will follow up as needed.  Darene Lamer Octavis Sheeler 10:01 AM    01/13/16 0900  Clinical Encounter Type  Visited With Patient  Visit Type Initial;Spiritual support  Referral From Nurse  Spiritual Encounters  Spiritual Needs Prayer;Emotional;Grief support  Stress Factors  Patient Stress Factors Exhausted;Lack of knowledge;Loss of control;Major life changes

## 2016-01-13 NOTE — Progress Notes (Signed)
PROGRESS NOTE                                                                                                                                                                                                             Patient Demographics:    Carla Friedman, is a 82 y.o. female, DOB - 01-29-1934, [id number]  Admit date - 01/12/2016   Admitting Physician Waldemar Dickens, MD  Outpatient Primary MD for the patient is Laurey Morale, MD  LOS -   Chief Complaint  Patient presents with  . Emesis       Brief Narrative  82 y.o. female with a Past Medical History of CAD, PVD, HTN, HLD, DM, PUD, depression, hypothyroidism, diabetic neuropathy, who presents with gastroenteritis and colitis   Subjective:    Carla Friedman today has, Reports mild nausea, denies any vomiting, reports abdominal pain significantly subsided, no further diarrhea since admission    Assessment  & Plan :    Principal Problem:   Colitis Active Problems:   Elevated lipids   Essential hypertension   Peptic ulcer   Nausea & vomiting   S/P CABG (coronary artery bypass graft)   AKI (acute kidney injury) ([place])  Nausea vomiting and abdominal pain/colitis - This is most likely related to colitis as evidenced on CT abdomen and pelvis - Appears to be improving, reports no further diarrhea, no vomiting, no abdominal pain significantly improved, cytosis trending down - Continue with Cipro and Flagyl - Follow on stool workup if able to be collected(no further diarrhea since admission) - Continue with IV fluids, and nausea medication  Abnormal urinalysis - Urinalysis significant for bacteriuria, but has squamous cells, will follow on urine cultures,  she is on Cipro for colitis.  AKI - due to dehydration and volume depletion, improving with IV fluids.  Diabetes mellitus - Continue to hold home oral medication, continue with insulin sliding scale  Hyperlipidemia - Continue with  statin  Hypertension - Continue with home medication  Peripheral Vascular disease/ CAD s/p CABG . - Continue ASA and Plavix    Code Status : DNR  Family Communication  : D/W daughter Ms Carla Friedman via phone.  [facility]  [facility]  Consults  :  None  Procedures  : None  DVT Prophylaxis  :  Heparin  Lab Results  Component Value Date   PLT 187 01/13/2016    Antibiotics  :    Anti-infectives    Start     Dose/Rate Route Frequency Ordered Stop   01/12/16 1600  ciprofloxacin (CIPRO) tablet 500 mg     500 mg Oral Daily with breakfast 01/12/16 1524     01/12/16 1600  metroNIDAZOLE (FLAGYL) IVPB 500 mg     500 mg 100 mL/hr over 60 Minutes Intravenous Every 8 hours 01/12/16 1525     01/12/16 1500  metroNIDAZOLE (FLAGYL) IVPB 500 mg  Status:  Discontinued     500 mg 100 mL/hr over 60 Minutes Intravenous Every 12 hours 01/12/16 1447 01/12/16 1525   01/12/16 1445  ciprofloxacin (CIPRO) tablet 500 mg  Status:  Discontinued     500 mg Oral 2 times daily 01/12/16 1443 01/12/16 1524        Objective:   Filed Vitals:   01/12/16 1542 01/12/16 2151 01/13/16 0440 01/13/16 0500  BP: 134/55 100/47 120/50   Pulse: 77 85 81   Temp: 98.3 F (36.8 C) 98.5 F (36.9 C) 98.4 F (36.9 C)   TempSrc: Oral Oral Oral   Resp: 16     Weight: 60.7 kg (133 lb 13.1 oz)   59.9 kg (132 lb 0.9 oz)  SpO2: 100% 98% 97%     Wt Readings from Last 3 Encounters:  01/13/16 59.9 kg (132 lb 0.9 oz)  01/07/16 62.596 kg (138 lb)  12/15/15 63.821 kg (140 lb 11.2 oz)     Intake/Output Summary (Last 24 hours) at 01/13/16 1414 Last data filed at 01/13/16 1200  Gross per 24 hour  Intake   1755 ml  Output    700 ml  Net   1055 ml     Physical Exam  Awake Alert, in NAD  Supple Neck,No JVD,  Symmetrical Chest wall movement, Good air movement bilaterally, CTAB RRR,No Gallops,Rubs or new Murmurs, No Parasternal Heave +ve B.Sounds, Abd Soft, No tenderness,No  rebound - guarding or rigidity. No Cyanosis, Clubbing or edema, No new Rash or bruise     Data Review:    CBC  Recent Labs Lab 01/12/16 1018 01/13/16 0510  WBC 18.0* 12.5*  HGB 14.6 10.6*  HCT 45.3 32.8*  PLT 228 187  MCV 84.4 84.1  MCH 27.2 27.2  MCHC 32.2 32.3  RDW 14.7 15.1  LYMPHSABS 1.2  --   MONOABS 0.7  --   EOSABS 0.0  --   BASOSABS 0.0  --     Chemistries   Recent Labs Lab 01/12/16 1018 01/13/16 0510  NA 137 135  K 4.0 4.1  CL 103 107  CO2 24 21*  GLUCOSE 258* 145*  BUN 20 22*  CREATININE 1.76* 1.37*  CALCIUM 9.4 7.9*  AST 26 19  ALT 17 11*  ALKPHOS 163* 87  BILITOT 0.6 0.7   ------------------------------------------------------------------------------------------------------------------ No results for input(s): CHOL, HDL, LDLCALC, TRIG, CHOLHDL, LDLDIRECT in the last 72 hours.  Lab Results  Component Value Date   HGBA1C 5.6 01/12/2016   ------------------------------------------------------------------------------------------------------------------ No results for input(s): TSH, T4TOTAL, T3FREE, THYROIDAB in the last 72 hours.  Invalid input(s): FREET3 ------------------------------------------------------------------------------------------------------------------ No results for input(s): VITAMINB12, FOLATE, FERRITIN, TIBC, IRON, RETICCTPCT in the last 72 hours.  Coagulation profile No results for input(s): INR, PROTIME in the last 168 hours.  No results for input(s): DDIMER in the last 72 hours.  Cardiac Enzymes No results for input(s): CKMB, TROPONINI, MYOGLOBIN in the last 168 hours.  Invalid input(s): CK ------------------------------------------------------------------------------------------------------------------ No results found for: BNP  Inpatient Medications  Scheduled Meds: . amitriptyline  50 mg Oral QHS  . aspirin EC  81 mg Oral QHS  . atenolol  50 mg Oral Daily  . ciprofloxacin  500 mg Oral Q breakfast  .  clopidogrel  75 mg Oral Daily  . heparin subcutaneous  5,000 Units Subcutaneous Q8H  . insulin aspart  0-9 Units Subcutaneous TID WC  . isosorbide dinitrate  30 mg Oral BID  . metronidazole  500 mg Intravenous Q8H  . pravastatin  40 mg Oral Daily  . ramipril  10 mg Oral Daily   Continuous Infusions: . sodium chloride 1 mL (01/13/16 0538)   PRN Meds:.acetaminophen **OR** acetaminophen, ondansetron **OR** ondansetron (ZOFRAN) IV, senna-docusate, traZODone  Micro Results Recent Results (from the past 240 hour(s))  Urine culture     Status: None   Collection Time: 01/07/16  1:43 PM  Result Value Ref Range Status   Colony Count NO GROWTH  Final   Organism ID, Bacteria NO GROWTH  Final    Radiology Reports Ct Abdomen Pelvis Wo Contrast  01/12/2016  CLINICAL DATA:  Nausea, vomiting the past 24 hours EXAM: CT ABDOMEN AND PELVIS WITHOUT CONTRAST TECHNIQUE: Multidetector CT imaging of the abdomen and pelvis was performed following the standard protocol without IV contrast. COMPARISON:  09/16/2014 FINDINGS: Lower chest: Heart is borderline in size. No confluent opacities in the lung bases. No effusions. Hepatobiliary: Small layering gallstones within the gallbladder. No visible focal hepatic abnormality on this unenhanced study. Pancreas: No focal abnormality or ductal dilatation. Spleen: No focal abnormality.  Normal size. Adrenals/Urinary Tract: No adrenal abnormality. No focal renal abnormality. No stones or hydronephrosis. Urinary bladder is unremarkable. Stomach/Bowel: There is apparent wall thickening in the descending colon and proximal sigmoid colon with surrounding inflammatory change. There are scattered diverticula, but the process appears more diffuse than diverticulitis and likely reflects infectious or inflammatory colitis. No evidence of bowel obstruction. Stomach and small bowel decompressed, grossly unremarkable. Vascular/Lymphatic: Dense aortic and iliac calcifications. Dense splenic  artery calcifications. No aneurysm or adenopathy. Reproductive: Uterus and adnexa unremarkable.  No mass. Other: Moderate free fluid in the pelvis and around the liver. Musculoskeletal: No acute bony abnormality. Degenerative disc and facet disease in the lumbar spine. IMPRESSION: Wall thickening throughout the descending colon and into the proximal sigmoid colon with surrounding inflammatory change most compatible with colitis. Moderate free fluid in the abdomen and pelvis. Cholelithiasis. Aortic atherosclerosis. Electronically Signed   By: Rolm Baptise M.D.   On: 01/12/2016 11:44   Dg Chest 2 View  01/12/2016  CLINICAL DATA:  Leukocytosis, shortness of breath. Right chest pain. EXAM: CHEST  2 VIEW COMPARISON:  12/06/2015 FINDINGS: Patchy airspace disease again noted in the right midlung concerning for early pneumonia. Left lung is clear. Prior CABG. Heart is normal size. No effusions or acute bony abnormality. IMPRESSION: Patchy right mid lung airspace disease concerning for pneumonia. Followup PA and lateral chest X-ray is recommended in 3-4 weeks following trial of antibiotic therapy to ensure resolution and exclude underlying malignancy. Electronically Signed   By: Rolm Baptise M.D.   On: 01/12/2016 17:08    Time Spent in minutes  25 minutes   Antawn Sison M.D on 01/13/2016 at 2:14 PM  Between 7am to 7pm - Pager - [phone]  After 7pm go to [email or web address] - password [facility]  [facility] -  Office  [phone]

## 2016-01-13 NOTE — Progress Notes (Signed)
OT Cancellation Note  Patient Details Name: Carla Friedman MRN: [id number] DOB: Jan 29, 1934   Cancelled Treatment:    Reason Eval/Treat Not Completed: Other (comment) (OT not needed at this time per MD.) Per discussion with attending MD, OT not needed at this time. Will complete OT order.   [name] 01/13/2016, 9:58 AM

## 2016-01-14 LAB — GLUCOSE, CAPILLARY
GLUCOSE-CAPILLARY: 148 mg/dL — AB (ref 65–99)
Glucose-Capillary: 132 mg/dL — ABNORMAL HIGH (ref 65–99)

## 2016-01-14 LAB — URINE CULTURE

## 2016-01-14 MED ORDER — CIPROFLOXACIN HCL 500 MG PO TABS: 500.0000 mg | ORAL_TABLET | Freq: Every day | ORAL | Status: DC

## 2016-01-14 MED ORDER — METRONIDAZOLE 500 MG PO TABS: 500.0000 mg | ORAL_TABLET | Freq: Three times a day (TID) | ORAL | Status: DC

## 2016-01-14 MED ORDER — METRONIDAZOLE 500 MG PO TABS: 500.0000 mg | ORAL_TABLET | Freq: Three times a day (TID) | ORAL | Status: AC

## 2016-01-14 NOTE — Progress Notes (Signed)
Patient having formed bowel movements. MD made aware. Order to discontinue Cdiff stool sample test and enteric precautions.

## 2016-01-14 NOTE — Discharge Instructions (Signed)
Follow with Primary MD Laurey Morale, MD in 7 days   Get CBC, CMP, checked  by Primary MD next visit.    Activity: As tolerated with Full fall precautions use walker/cane & assistance as needed   Disposition ALF   Diet: Heart Healthy  , with feeding assistance and aspiration precautions.  For Heart failure patients - Check your Weight same time everyday, if you gain over 2 pounds, or you develop in leg swelling, experience more shortness of breath or chest pain, call your Primary MD immediately. Follow Cardiac Low Salt Diet and 1.5 lit/day fluid restriction.   On your next visit with your primary care physician please Get Medicines reviewed and adjusted.   Please request your Prim.MD to go over all Hospital Tests and Procedure/Radiological results at the follow up, please get all Hospital records sent to your Prim MD by signing hospital release before you go home.   If you experience worsening of your admission symptoms, develop shortness of breath, life threatening emergency, suicidal or homicidal thoughts you must seek medical attention immediately by calling 911 or calling your MD immediately  if symptoms less severe.  You Must read complete instructions/literature along with all the possible adverse reactions/side effects for all the Medicines you take and that have been prescribed to you. Take any new Medicines after you have completely understood and accpet all the possible adverse reactions/side effects.   Do not drive, operating heavy machinery, perform activities at heights, swimming or participation in water activities or provide baby sitting services if your were admitted for syncope or siezures until you have seen by Primary MD or a Neurologist and advised to do so again.  Do not drive when taking Pain medications.    Do not take more than prescribed Pain, Sleep and Anxiety Medications  Special Instructions: If you have smoked or chewed Tobacco  in the last 2 yrs please  stop smoking, stop any regular Alcohol  and or any Recreational drug use.  Wear Seat belts while driving.   Please note  You were cared for by a hospitalist during your hospital stay. If you have any questions about your discharge medications or the care you received while you were in the hospital after you are discharged, you can call the unit and asked to speak with the hospitalist on call if the hospitalist that took care of you is not available. Once you are discharged, your primary care physician will handle any further medical issues. Please note that NO REFILLS for any discharge medications will be authorized once you are discharged, as it is imperative that you return to your primary care physician (or establish a relationship with a primary care physician if you do not have one) for your aftercare needs so that they can reassess your need for medications and monitor your lab values.

## 2016-01-14 NOTE — Clinical Social Work Note (Signed)
Clinical Social Work Assessment  Patient Details  Name: Carla Friedman MRN: [id number] Date of Birth: [birth year]  Date of referral:  01/14/16               Reason for consult:  Facility Placement                Permission sought to share information with:  Facility Sport and exercise psychologist, Family Supports Permission granted to share information::  Yes, Verbal Permission Granted  Name::     Valere Dross Daughter [phone]  Agency::  [facility]  Relationship::     Contact Information:     Housing/Transportation Living arrangements for the past 2 months:  [place] of Information:  Patient, Facility Patient Interpreter Needed:  None Criminal Activity/Legal Involvement Pertinent to Current Situation/Hospitalization:  No - Comment as needed Significant Relationships:  Adult Children Lives with:  Facility Resident Do you feel safe going back to the place where you live?  Yes Need for family participation in patient care:  No (Coment)  Care giving concerns: Patient does not express any concerns about returning back to ALF.   Social Worker assessment / plan:  Patient is a 82 year old female who has some dementia.  Patient is alert and oriented x3, but able to express how she feels.  Patient has been living at [facility].  Patient expressed that she is looking forward to returning back to facility.  Patient is going to be transported by a friend of hers.  Patient expressed that she has some memory loss, but she feels like she is doing well overall.  Patient expressed that she feels better then she did in previous days.  Patient did not express any other concerns or issues.  Employment status:  Retired Forensic scientist:  Medicare PT Recommendations:  Not assessed at this time [place] / Referral to community resources:     Patient/Family's Response to care:  Patient in agreement to returning back to ALF  Patient/Family's Understanding of and Emotional  Response to Diagnosis, Current Treatment, and Prognosis:  Patient aware of current treatment plan and prognosis.  Emotional Assessment Appearance:  Appears stated age Attitude/Demeanor/Rapport:    Affect (typically observed):  Appropriate, Pleasant Orientation:  Oriented to Self, Oriented to Place, Oriented to Situation Alcohol / Substance use:  Not Applicable Psych involvement (Current and /or in the community):  No (Comment)  Discharge Needs  Concerns to be addressed:  No discharge needs identified Readmission within the last 30 days:  No Current discharge risk:  None Barriers to Discharge:  No Barriers Identified   Ross Ludwig, LCSWA 01/14/2016, 1:21 PM

## 2016-01-14 NOTE — NC FL2 (Signed)
[place] LEVEL OF CARE SCREENING TOOL     IDENTIFICATION  Patient Name: Carla Friedman Birthdate: 17-Mar-1934 Sex: female Admission Date (Current Location): 01/12/2016  [facility] and [place] Number:  Herbalist and Address:  [facility], [place] [address], [place], [place] [address]      Provider Number: [id number]  Attending Physician Name and Address:  Albertine Patricia, MD  Relative Name and Phone Number:  Valere Dross Daughter [phone]    Current Level of Care: Hospital Recommended Level of Care: [place] Prior Approval Number:    Date Approved/Denied:   PASRR Number:    Discharge Plan: Other (Comment) [facility])    Current Diagnoses: Patient Active Problem List   Diagnosis Date Noted  . Nausea and vomiting 01/13/2016  . Colitis 01/12/2016  . Nausea & vomiting 01/12/2016  . S/P CABG (coronary artery bypass graft) 01/12/2016  . AKI (acute kidney injury) ([place]) 01/12/2016  . Dementia 11/09/2014  . At high risk for falls 11/09/2014  . Hyponatremia 07/15/2014  . SAH (subarachnoid hemorrhage) ([place]) 06/05/2014  . SDH (subdural hematoma) ([place]) 06/05/2014  . Diabetes mellitus type 2 in nonobese ([place]) 06/05/2014  . Fever blister 02/26/2014  . Hives 02/26/2014  . Skin cancer 11/18/2012  . Memory loss 08/07/2012  . DYSPNEA 11/01/2009  . HYPERTHYROIDISM 11/19/2008  . CHEST PAIN, ATYPICAL 10/07/2008  . LOW BACK PAIN 08/12/2008  . DIVERTICULOSIS OF COLON 02/11/2008  . Backache 02/11/2008  . COLONIC POLYPS, ADENOMATOUS, HX OF 02/11/2008  . Diabetes mellitus without complication ([place]) [id number]  . Elevated lipids 07/31/2007  . Depression 03/04/2007  . Essential hypertension 03/04/2007  . Coronary atherosclerosis 03/04/2007  . Peripheral vascular disease ([place]) 03/04/2007  . Peptic ulcer 03/04/2007    Orientation RESPIRATION BLADDER Height & Weight     Self, Place, Situation  Normal Continent  Weight: 140 lb 14 oz (63.9 kg) Height:  5\' 3"  (160 cm)  BEHAVIORAL SYMPTOMS/MOOD NEUROLOGICAL BOWEL NUTRITION STATUS      Continent Heart Healthy  AMBULATORY STATUS COMMUNICATION OF NEEDS Skin   Supervision Verbally Normal                       Personal Care Assistance Level of Assistance  Bathing, Feeding, Dressing Bathing Assistance: Limited assistance Feeding assistance: Independent Dressing Assistance: Independent     Functional Limitations Info  Speech, Hearing, Sight Sight Info: Adequate Hearing Info: Adequate Speech Info: Adequate    SPECIAL CARE FACTORS FREQUENCY                       Contractures      Additional Factors Info  Insulin Sliding Scale       Insulin Sliding Scale Info: 3x a day.       Current Medications (01/14/2016):  This is the current hospital active medication list Current Facility-Administered Medications  Medication Dose Route Frequency Provider Last Rate Last Dose  . 0.9 %  sodium chloride infusion   Intravenous Continuous Rondel Jumbo, PA-C 75 mL/hr at 01/14/16 [id number]    . acetaminophen (TYLENOL) tablet 650 mg  650 mg Oral Q6H PRN Rondel Jumbo, PA-C   650 mg at 01/13/16 2110   Or  . acetaminophen (TYLENOL) suppository 650 mg  650 mg Rectal Q6H PRN Rondel Jumbo, PA-C      . amitriptyline (ELAVIL) tablet 50 mg  50 mg Oral QHS Rondel Jumbo, PA-C  50 mg at 01/13/16 2111  . aspirin EC tablet 81 mg  81 mg Oral QHS Rondel Jumbo, PA-C   81 mg at 01/13/16 2111  . atenolol (TENORMIN) tablet 50 mg  50 mg Oral Daily Rondel Jumbo, PA-C   50 mg at 01/14/16 0859  . ciprofloxacin (CIPRO) tablet 500 mg  500 mg Oral Q breakfast Rozann Lesches, RPH   500 mg at 01/14/16 0859  . clopidogrel (PLAVIX) tablet 75 mg  75 mg Oral Daily Rondel Jumbo, PA-C   75 mg at 01/14/16 0859  . heparin injection 5,000 Units  5,000 Units Subcutaneous Q8H Rondel Jumbo, PA-C   5,000 Units at 01/14/16 [id number]  . insulin aspart (novoLOG) injection 0-9 Units   0-9 Units Subcutaneous TID WC Rondel Jumbo, PA-C   1 Units at 01/13/16 0827  . isosorbide dinitrate (ISORDIL) tablet 30 mg  30 mg Oral BID Rondel Jumbo, PA-C   30 mg at 01/14/16 [id number]  . metroNIDAZOLE (FLAGYL) tablet 500 mg  500 mg Oral Q8H Jake Church Masters, RPH      . ondansetron [facility]) tablet 4 mg  4 mg Oral Q6H PRN Rondel Jumbo, PA-C       Or  . ondansetron [facility]) injection 4 mg  4 mg Intravenous Q6H PRN Rondel Jumbo, PA-C      . pravastatin (PRAVACHOL) tablet 40 mg  40 mg Oral Daily Rondel Jumbo, PA-C   40 mg at 01/14/16 0858  . ramipril (ALTACE) capsule 10 mg  10 mg Oral Daily Rondel Jumbo, PA-C   10 mg at 01/14/16 [id number]  . senna-docusate (Senokot-S) tablet 1 tablet  1 tablet Oral QHS PRN Rondel Jumbo, PA-C      . traZODone (DESYREL) tablet 25 mg  25 mg Oral QHS PRN Rondel Jumbo, PA-C   25 mg at 01/13/16 2348     Discharge Medications: Please see discharge summary for a list of discharge medications.  Relevant Imaging Results:  Relevant Lab Results:   Additional Information SSN [id number]  Ross Ludwig, [place]

## 2016-01-14 NOTE — Discharge Summary (Addendum)
Carla Friedman, is a 82 y.o. female  DOB 03-Jun-1934  MRN [id number].  Admission date:  01/12/2016  Admitting Physician  Waldemar Dickens, MD  Discharge Date:  01/14/2016   Primary MD  Laurey Morale, MD  Recommendations for primary care physician for things to follow:  - Please check CBC, BMP in  3 days.  CODE STATUS is DO NOT RESUSCITATE, formed with daughter Ms. Bruno Admission Diagnosis  Colitis [K52.9] Leukocytosis [D72.829] Nausea & vomiting [R11.2]   Discharge Diagnosis  Colitis [K52.9] Leukocytosis [D72.829] Nausea & vomiting [R11.2]    Principal Problem:   Colitis Active Problems:   Elevated lipids   Essential hypertension   Peptic ulcer   Nausea & vomiting   S/P CABG (coronary artery bypass graft)   AKI (acute kidney injury) ([place])   Nausea and vomiting      Past Medical History  Diagnosis Date  . Coronary artery disease     sees Dr. Johnsie Cancel, in Hospice for this   . Chest pain, atypical   . PVD (peripheral vascular disease) ([place])   . Hypertension   . Dyslipidemia   . Peptic ulcer disease   . Depression   . Hypothyroidism   . Back pain     low  . Confusion   . Diabetes mellitus type II   . Diabetic neuropathy ([place])   . Arthritis     "not much" (01/12/2016)  . Cancer, skin, squamous cell     sees Dr. Lois Huxley at [facility] dermatology  . Poor short term memory   . On home oxygen therapy     "prn" (01/12/2016)  . Falls frequently     "4-5 times in the last 6 months" (01/12/2016)    Past Surgical History  Procedure Laterality Date  . Appendectomy    . Tonsillectomy    . Cataract extraction    . Colonoscopy  02-20-08    per Dr. Olevia Perches, diverticulosis, no polyps, repeat in 10 yrs   . Coronary angioplasty with stent placement  2004    both in the native LAD and in the vein graft to the marginal /notes 11/23/2010  . Cardiac catheterization  06/2001 X 2; 11/2006;  10/2008     Archie Endo 12/07/2010 & 11/23/2010  . Coronary artery bypass graft  2001    "double"       History of present illness and  Hospital Course:     Kindly see H&P for history of present illness and admission details, please review complete Labs, Consult reports and Test reports for all details in brief  HPI  from the history and physical done on the day of admission 01/12/2016 Carla Friedman is a 82 y.o. female with medical history significant for HTN, HLD, CAD, PVD, DM2, PUD, mild Dementia, recent ED visit for a Mechanical fall Presenting with acute episode of nausea and vomiting preceded by diarrhea 2 days,ago, not present at this time.  She denies any food poisoning. Was treated with antibiotics about 1 month ago. At  this time, her vomiting subsided, but continues to experience some nausea controlled with Zofran. Denies any sick contacts. Denies any fever, chills or night sweats. No chest pain or shortness of breath. Denies any dizziness or vertigo. She denies any rashes or cuts.She is now feeling better after IV fluids were given.    Hospital Course  82 y.o. female with a Past Medical History of CAD, PVD, HTN, HLD, DM, PUD, depression, hypothyroidism, diabetic neuropathy, who presents with gastroenteritis and colitis  Nausea vomiting and abdominal pain/colitis - This is most likely related to colitis as evidenced on CT abdomen and pelvis - Significantly improved, with by mouth intake, no further diarrhea since admission, only one solid bowel movement over last 24 hours, afebrile, leukocytosis trending down, treated with Cipro and Flagyl, finished 3 days during hospital stay, to finish another 4 days as an outpatient.  Abnormal urinalysis - Urinalysis significant for bacteriuria, but has squamous cells, urine cultures unhelpful as she is growing multiple species, she is on Cipro for colitis.  AKI - due to dehydration and volume depletion, improving with IV fluids.  Diabetes  mellitus - Continue home medication on discharge  Hyperlipidemia - Continue with statin  Hypertension - Continue with home medication  Peripheral Vascular disease/ CAD s/p CABG . - Continue ASA and Plavix   Dementia - Patient with baseline dementia as discussed with daughter, continue with supportive care  Discharge Condition:  stable   Follow UP      Discharge Instructions  and  Discharge Medications        Discharge Instructions    Discharge instructions    Complete by:  As directed   Follow with Primary MD Laurey Morale, MD in 7 days   Get CBC, CMP, checked  by Primary MD next visit.    Activity: As tolerated with Full fall precautions use walker/cane & assistance as needed   Disposition ALF   Diet: Heart Healthy  , with feeding assistance and aspiration precautions.  For Heart failure patients - Check your Weight same time everyday, if you gain over 2 pounds, or you develop in leg swelling, experience more shortness of breath or chest pain, call your Primary MD immediately. Follow Cardiac Low Salt Diet and 1.5 lit/day fluid restriction.   On your next visit with your primary care physician please Get Medicines reviewed and adjusted.   Please request your Prim.MD to go over all Hospital Tests and Procedure/Radiological results at the follow up, please get all Hospital records sent to your Prim MD by signing hospital release before you go home.   If you experience worsening of your admission symptoms, develop shortness of breath, life threatening emergency, suicidal or homicidal thoughts you must seek medical attention immediately by calling 911 or calling your MD immediately  if symptoms less severe.  You Must read complete instructions/literature along with all the possible adverse reactions/side effects for all the Medicines you take and that have been prescribed to you. Take any new Medicines after you have completely understood and accpet all the possible  adverse reactions/side effects.   Do not drive, operating heavy machinery, perform activities at heights, swimming or participation in water activities or provide baby sitting services if your were admitted for syncope or siezures until you have seen by Primary MD or a Neurologist and advised to do so again.  Do not drive when taking Pain medications.    Do not take more than prescribed Pain, Sleep and Anxiety Medications  Special Instructions: If you  have smoked or chewed Tobacco  in the last 2 yrs please stop smoking, stop any regular Alcohol  and or any Recreational drug use.  Wear Seat belts while driving.   Please note  You were cared for by a hospitalist during your hospital stay. If you have any questions about your discharge medications or the care you received while you were in the hospital after you are discharged, you can call the unit and asked to speak with the hospitalist on call if the hospitalist that took care of you is not available. Once you are discharged, your primary care physician will handle any further medical issues. Please note that NO REFILLS for any discharge medications will be authorized once you are discharged, as it is imperative that you return to your primary care physician (or establish a relationship with a primary care physician if you do not have one) for your aftercare needs so that they can reassess your need for medications and monitor your lab values.     Increase activity slowly    Complete by:  As directed             Medication List    TAKE these medications        amitriptyline 50 MG tablet  Commonly known as:  ELAVIL  Take 1 tablet (50 mg total) by mouth at bedtime.     aspirin EC 81 MG tablet  Take 81 mg by mouth at bedtime.     atenolol 50 MG tablet  Commonly known as:  TENORMIN  Take 1 tablet by mouth  every morning     CALCIUM 600 PO  Take 600 mg by mouth daily.     ciprofloxacin 500 MG tablet  Commonly known as:  CIPRO    Take 1 tablet (500 mg total) by mouth daily with breakfast.  Start taking on:  01/15/2016     clopidogrel 75 MG tablet  Commonly known as:  PLAVIX  Take 1 tablet (75 mg total) by mouth daily.     [employer] CRANBERRY 500 MG Caps  Generic drug:  Cranberry  Take 500 mg by mouth daily.     FISH OIL PO  Take 1 capsule by mouth daily.     isosorbide dinitrate 30 MG tablet  Commonly known as:  ISORDIL  Take 1 tablet (30 mg total) by mouth 2 (two) times daily.     metFORMIN 1000 MG tablet  Commonly known as:  GLUCOPHAGE  TAKE ONE TABLET BY MOUTH ONCE DAILY WITH BREAKFAST     metroNIDAZOLE 500 MG tablet  Commonly known as:  FLAGYL  Take 1 tablet (500 mg total) by mouth every 8 (eight) hours.  Notes to Patient:  6am, 2pm, 10pm     multivitamin capsule  Take 1 capsule by mouth daily.     nitroGLYCERIN 0.4 MG SL tablet  Commonly known as:  NITROSTAT  Place 1 tablet (0.4 mg total) under the tongue every 5 (five) minutes as needed for chest pain (3 doses max). Reported on 08/31/2015     ondansetron 4 MG disintegrating tablet  Commonly known as:  ZOFRAN ODT  Take 1 tablet (4 mg total) by mouth every 8 (eight) hours as needed for nausea.     pravastatin 40 MG tablet  Commonly known as:  PRAVACHOL  Take 1 tablet (40 mg total) by mouth daily.     ramipril 10 MG capsule  Commonly known as:  ALTACE  Take 1 capsule (10 mg total) by  mouth daily.     ranolazine 500 MG 12 hr tablet  Commonly known as:  RANEXA  Take 1 tablet (500 mg total) by mouth daily.     valACYclovir 500 MG tablet  Commonly known as:  VALTREX  Take 1 tablet (500 mg total) by mouth 2 (two) times daily as needed (fever blisters).     vitamin C 500 MG tablet  Commonly known as:  ASCORBIC ACID  Take 500 mg by mouth daily.     Vitamin D 1000 units capsule  Take 1,000 Units by mouth daily.          Diet and Activity recommendation: See Discharge Instructions above   Consults obtained -  none   Major procedures  and Radiology Reports - PLEASE review detailed and final reports for all details, in brief -     Ct Abdomen Pelvis Wo Contrast  01/12/2016  CLINICAL DATA:  Nausea, vomiting the past 24 hours EXAM: CT ABDOMEN AND PELVIS WITHOUT CONTRAST TECHNIQUE: Multidetector CT imaging of the abdomen and pelvis was performed following the standard protocol without IV contrast. COMPARISON:  09/16/2014 FINDINGS: Lower chest: Heart is borderline in size. No confluent opacities in the lung bases. No effusions. Hepatobiliary: Small layering gallstones within the gallbladder. No visible focal hepatic abnormality on this unenhanced study. Pancreas: No focal abnormality or ductal dilatation. Spleen: No focal abnormality.  Normal size. Adrenals/Urinary Tract: No adrenal abnormality. No focal renal abnormality. No stones or hydronephrosis. Urinary bladder is unremarkable. Stomach/Bowel: There is apparent wall thickening in the descending colon and proximal sigmoid colon with surrounding inflammatory change. There are scattered diverticula, but the process appears more diffuse than diverticulitis and likely reflects infectious or inflammatory colitis. No evidence of bowel obstruction. Stomach and small bowel decompressed, grossly unremarkable. Vascular/Lymphatic: Dense aortic and iliac calcifications. Dense splenic artery calcifications. No aneurysm or adenopathy. Reproductive: Uterus and adnexa unremarkable.  No mass. Other: Moderate free fluid in the pelvis and around the liver. Musculoskeletal: No acute bony abnormality. Degenerative disc and facet disease in the lumbar spine. IMPRESSION: Wall thickening throughout the descending colon and into the proximal sigmoid colon with surrounding inflammatory change most compatible with colitis. Moderate free fluid in the abdomen and pelvis. Cholelithiasis. Aortic atherosclerosis. Electronically Signed   By: Rolm Baptise M.D.   On: 01/12/2016 11:44   Dg Chest 2 View  01/12/2016  CLINICAL  DATA:  Leukocytosis, shortness of breath. Right chest pain. EXAM: CHEST  2 VIEW COMPARISON:  12/06/2015 FINDINGS: Patchy airspace disease again noted in the right midlung concerning for early pneumonia. Left lung is clear. Prior CABG. Heart is normal size. No effusions or acute bony abnormality. IMPRESSION: Patchy right mid lung airspace disease concerning for pneumonia. Followup PA and lateral chest X-ray is recommended in 3-4 weeks following trial of antibiotic therapy to ensure resolution and exclude underlying malignancy. Electronically Signed   By: Rolm Baptise M.D.   On: 01/12/2016 17:08    Micro Results    Recent Results (from the past 240 hour(s))  Urine culture     Status: None   Collection Time: 01/07/16  1:43 PM  Result Value Ref Range Status   Colony Count NO GROWTH  Final   Organism ID, Bacteria NO GROWTH  Final  Culture, blood (Routine X 2) w Reflex to ID Panel     Status: None (Preliminary result)   Collection Time: 01/12/16  3:40 PM  Result Value Ref Range Status   Specimen Description BLOOD LEFT ARM  Final  Special Requests   Final    BOTTLES DRAWN AEROBIC AND ANAEROBIC 10CC AER,6CC ANA   Culture NO GROWTH 2 DAYS  Final   Report Status PENDING  Incomplete  Culture, blood (Routine X 2) w Reflex to ID Panel     Status: None (Preliminary result)   Collection Time: 01/12/16  3:40 PM  Result Value Ref Range Status   Specimen Description BLOOD LEFT HAND  Final   Special Requests IN PEDIATRIC BOTTLE 2CC  Final   Culture NO GROWTH 2 DAYS  Final   Report Status PENDING  Incomplete  Urine culture     Status: Abnormal   Collection Time: 01/13/16 12:04 PM  Result Value Ref Range Status   Specimen Description URINE, CLEAN CATCH  Final   Special Requests NONE  Final   Culture MULTIPLE SPECIES PRESENT, SUGGEST RECOLLECTION (A)  Final   Report Status 01/14/2016 FINAL  Final       Today   Subjective:   Carla Friedman today has no headache,no chest or abdominal pain, no  further diarrhea , good by mouth intake, feels much better today . Objective:   Blood pressure 137/59, pulse 67, temperature 98.7 F (37.1 C), temperature source Oral, resp. rate 15, height 5\' 3"  (1.6 m), weight 63.9 kg (140 lb 14 oz), SpO2 99 %.   Intake/Output Summary (Last 24 hours) at 01/14/16 1511 Last data filed at 01/14/16 1333  Gross per 24 hour  Intake 1827.5 ml  Output   1277 ml  Net  550.5 ml    Exam Awake Alert, in NAD  Supple Neck,No JVD,  Symmetrical Chest wall movement, Good air movement bilaterally, CTAB RRR,No Gallops,Rubs or new Murmurs, No Parasternal Heave +ve B.Sounds, Abd Soft, No tenderness,No rebound - guarding or rigidity. No Cyanosis, Clubbing or edema, No new Rash or bruise   Data Review   CBC w Diff:  Lab Results  Component Value Date   WBC 12.5* 01/13/2016   HGB 10.6* 01/13/2016   HCT 32.8* 01/13/2016   PLT 187 01/13/2016   LYMPHOPCT 6 01/12/2016   MONOPCT 4 01/12/2016   EOSPCT 0 01/12/2016   BASOPCT 0 01/12/2016    CMP:  Lab Results  Component Value Date   NA 135 01/13/2016   K 4.1 01/13/2016   CL 107 01/13/2016   CO2 21* 01/13/2016   BUN 22* 01/13/2016   CREATININE 1.37* 01/13/2016   PROT 4.9* 01/13/2016   ALBUMIN 2.7* 01/13/2016   BILITOT 0.7 01/13/2016   ALKPHOS 87 01/13/2016   AST 19 01/13/2016   ALT 11* 01/13/2016  .   Total Time in preparing paper work, data evaluation and todays exam - 35 minutes  Billyjack Trompeter M.D on 01/14/2016 at 3:11 PM  [facility]   Office  [phone]

## 2016-01-14 NOTE — Progress Notes (Signed)
Rondall Allegra to be D/C'd Nursing Home per MD order.  Discussed with the patient and all questions fully answered.  VSS, Skin clean, dry and intact without evidence of skin break down, no evidence of skin tears noted. IV catheter discontinued intact. Site without signs and symptoms of complications. Dressing and pressure applied.  An After Visit Summary was printed and given to the patient. Patient received prescription.  D/c education completed with patient/family including follow up instructions, medication list, d/c activities limitations if indicated, with other d/c instructions as indicated by MD - patient able to verbalize understanding, all questions fully answered.   Patient instructed to return to ED, call 911, or call MD for any changes in condition.   Patient escorted via [place], and D/C home via private auto.  L'ESPERANCE, Kerney Hopfensperger C 01/14/2016 3:44 PM

## 2016-01-14 NOTE — Care Management Note (Signed)
Case Management Note  Patient Details  Name: DENIELLE COMEGYS MRN: [id number] Date of Birth: Dec 23, 1933  Subjective/Objective:                    Action/Plan:  Patient from assisted living with [facility] for RN/PT/OT/aide/SW  Received orders to resume Expected Discharge Date:                  Expected Discharge Plan:  [place]  In-House Referral:  Clinical Social Work  Discharge planning Services  CM Consult  Post Acute Care Choice:  Home Health Choice offered to:  Patient  DME Arranged:    DME Agency:     HH Arranged:  RN, PT, OT, Nurse's Aide, Social Work [employer] Agency:  [employer] (now [facility])  Status of Service:  Completed, signed off  If discussed at [employer], dates discussed:    Additional Comments:  Marilu Favre, RN 01/14/2016, 9:56 AM

## 2016-01-14 NOTE — Clinical Social Work Note (Addendum)
CSW spoke with [facility] who will review patient's information and determine if they can take patient back today once discharge summary has been completed and order has been received.   2:45pm  CSW spoke to [facility] [phone] who has verified they can take patient back today.  CSW contacted bedside nurse, who will contact patient's friend Suezanne Jacquet who will transport.  Jones Broom. Newman, MSW, [place] 01/14/2016 12:44 PM

## 2016-01-14 NOTE — Progress Notes (Signed)
CSW stated everything was complete and patient able to discharge. Thayer Headings contacted and voicemail left to make aware. Thayer Headings called earlier stating she will provide transport for patient today.

## 2016-01-16 DIAGNOSIS — W19XXXD Unspecified fall, subsequent encounter: Secondary | ICD-10-CM | POA: Diagnosis not present

## 2016-01-16 DIAGNOSIS — S01501D Unspecified open wound of lip, subsequent encounter: Secondary | ICD-10-CM | POA: Diagnosis not present

## 2016-01-16 DIAGNOSIS — Z9981 Dependence on supplemental oxygen: Secondary | ICD-10-CM | POA: Diagnosis not present

## 2016-01-16 DIAGNOSIS — Z7982 Long term (current) use of aspirin: Secondary | ICD-10-CM | POA: Diagnosis not present

## 2016-01-16 DIAGNOSIS — S51802D Unspecified open wound of left forearm, subsequent encounter: Secondary | ICD-10-CM | POA: Diagnosis not present

## 2016-01-16 DIAGNOSIS — R296 Repeated falls: Secondary | ICD-10-CM | POA: Diagnosis not present

## 2016-01-17 LAB — CULTURE, BLOOD (ROUTINE X 2)
CULTURE: NO GROWTH
Culture: NO GROWTH

## 2016-01-18 DIAGNOSIS — S01501D Unspecified open wound of lip, subsequent encounter: Secondary | ICD-10-CM | POA: Diagnosis not present

## 2016-01-18 DIAGNOSIS — W19XXXD Unspecified fall, subsequent encounter: Secondary | ICD-10-CM | POA: Diagnosis not present

## 2016-01-18 DIAGNOSIS — Z7982 Long term (current) use of aspirin: Secondary | ICD-10-CM | POA: Diagnosis not present

## 2016-01-18 DIAGNOSIS — S51802D Unspecified open wound of left forearm, subsequent encounter: Secondary | ICD-10-CM | POA: Diagnosis not present

## 2016-01-18 DIAGNOSIS — R296 Repeated falls: Secondary | ICD-10-CM | POA: Diagnosis not present

## 2016-01-18 DIAGNOSIS — Z9981 Dependence on supplemental oxygen: Secondary | ICD-10-CM | POA: Diagnosis not present

## 2016-01-19 DIAGNOSIS — Z9981 Dependence on supplemental oxygen: Secondary | ICD-10-CM | POA: Diagnosis not present

## 2016-01-19 DIAGNOSIS — W19XXXD Unspecified fall, subsequent encounter: Secondary | ICD-10-CM | POA: Diagnosis not present

## 2016-01-19 DIAGNOSIS — R296 Repeated falls: Secondary | ICD-10-CM | POA: Diagnosis not present

## 2016-01-19 DIAGNOSIS — S01501D Unspecified open wound of lip, subsequent encounter: Secondary | ICD-10-CM | POA: Diagnosis not present

## 2016-01-19 DIAGNOSIS — Z7982 Long term (current) use of aspirin: Secondary | ICD-10-CM | POA: Diagnosis not present

## 2016-01-19 DIAGNOSIS — S51802D Unspecified open wound of left forearm, subsequent encounter: Secondary | ICD-10-CM | POA: Diagnosis not present

## 2016-01-20 ENCOUNTER — Telehealth: Payer: Self-pay | Admitting: Family Medicine

## 2016-01-20 NOTE — Telephone Encounter (Signed)
Carla Friedman PT would like to have verbal orders called in.  Orders:  twice a week for 3 weeks.  May call with verbal orders  [phone]

## 2016-01-21 NOTE — Telephone Encounter (Signed)
Please call this order in  

## 2016-01-21 NOTE — Telephone Encounter (Signed)
Left Danielle a message letting her know the PT was approved & to call with any questions.

## 2016-01-22 DIAGNOSIS — W19XXXD Unspecified fall, subsequent encounter: Secondary | ICD-10-CM | POA: Diagnosis not present

## 2016-01-22 DIAGNOSIS — R296 Repeated falls: Secondary | ICD-10-CM | POA: Diagnosis not present

## 2016-01-22 DIAGNOSIS — Z9981 Dependence on supplemental oxygen: Secondary | ICD-10-CM | POA: Diagnosis not present

## 2016-01-22 DIAGNOSIS — S51802D Unspecified open wound of left forearm, subsequent encounter: Secondary | ICD-10-CM | POA: Diagnosis not present

## 2016-01-22 DIAGNOSIS — S01501D Unspecified open wound of lip, subsequent encounter: Secondary | ICD-10-CM | POA: Diagnosis not present

## 2016-01-22 DIAGNOSIS — Z7982 Long term (current) use of aspirin: Secondary | ICD-10-CM | POA: Diagnosis not present

## 2016-01-24 DIAGNOSIS — S51802D Unspecified open wound of left forearm, subsequent encounter: Secondary | ICD-10-CM | POA: Diagnosis not present

## 2016-01-24 DIAGNOSIS — R296 Repeated falls: Secondary | ICD-10-CM | POA: Diagnosis not present

## 2016-01-24 DIAGNOSIS — S01501D Unspecified open wound of lip, subsequent encounter: Secondary | ICD-10-CM | POA: Diagnosis not present

## 2016-01-24 DIAGNOSIS — W19XXXD Unspecified fall, subsequent encounter: Secondary | ICD-10-CM | POA: Diagnosis not present

## 2016-01-24 DIAGNOSIS — Z7982 Long term (current) use of aspirin: Secondary | ICD-10-CM | POA: Diagnosis not present

## 2016-01-24 DIAGNOSIS — Z9981 Dependence on supplemental oxygen: Secondary | ICD-10-CM | POA: Diagnosis not present

## 2016-01-26 DIAGNOSIS — R296 Repeated falls: Secondary | ICD-10-CM | POA: Diagnosis not present

## 2016-01-26 DIAGNOSIS — S51802D Unspecified open wound of left forearm, subsequent encounter: Secondary | ICD-10-CM | POA: Diagnosis not present

## 2016-01-26 DIAGNOSIS — W19XXXD Unspecified fall, subsequent encounter: Secondary | ICD-10-CM | POA: Diagnosis not present

## 2016-01-26 DIAGNOSIS — Z9981 Dependence on supplemental oxygen: Secondary | ICD-10-CM | POA: Diagnosis not present

## 2016-01-26 DIAGNOSIS — Z7982 Long term (current) use of aspirin: Secondary | ICD-10-CM | POA: Diagnosis not present

## 2016-01-26 DIAGNOSIS — S01501D Unspecified open wound of lip, subsequent encounter: Secondary | ICD-10-CM | POA: Diagnosis not present

## 2016-01-30 IMAGING — CR DG HIP (WITH OR WITHOUT PELVIS) 5+V BILAT
5 series · 5 of 5 positions shown · non-contrast
Comparison: None.

CLINICAL DATA: Recent falls with posterior right hip pain ever
since. Initial encounter.

EXAM:
BILATERAL HIP (WITH PELVIS) 5-6 VIEWS

[view not recorded (1 of 5)]
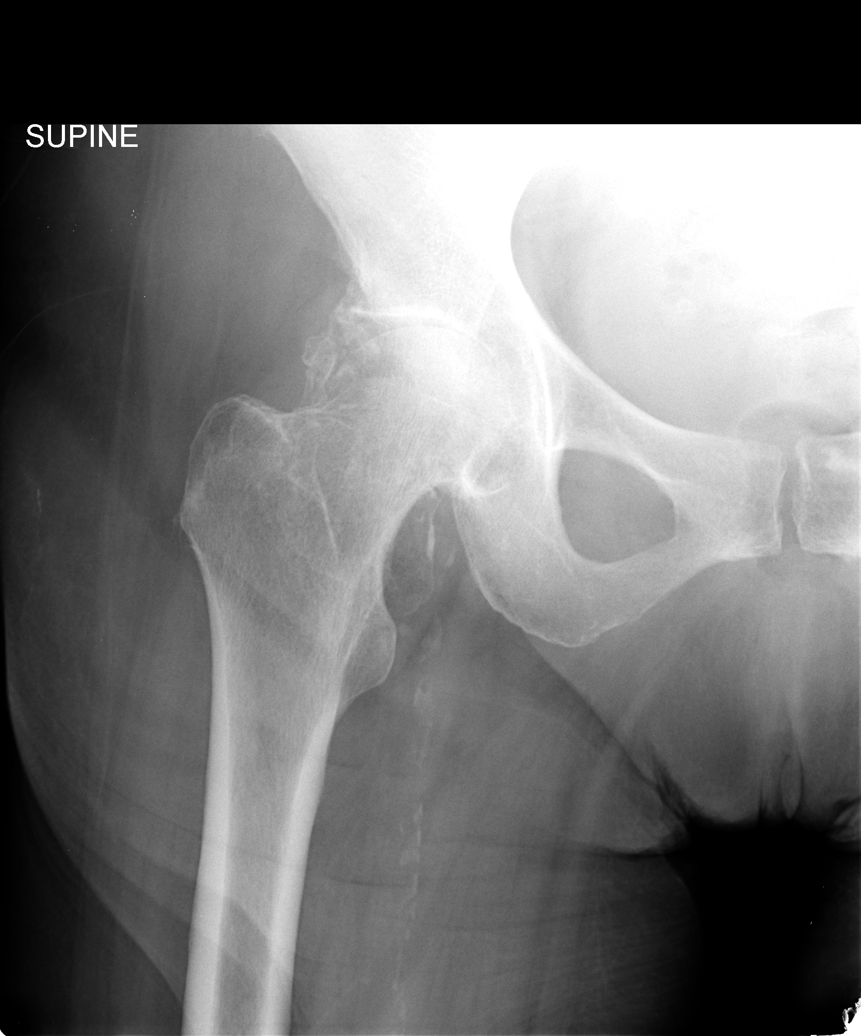

[view not recorded (2 of 5)]
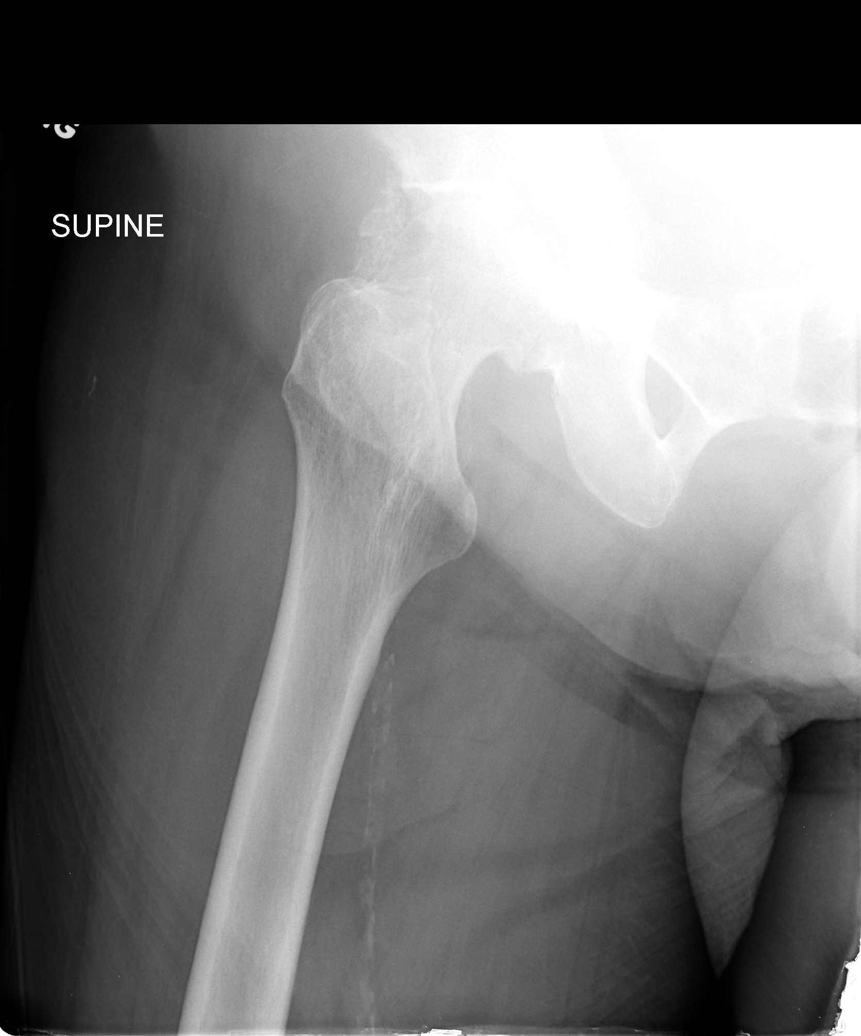

[view not recorded (3 of 5)]
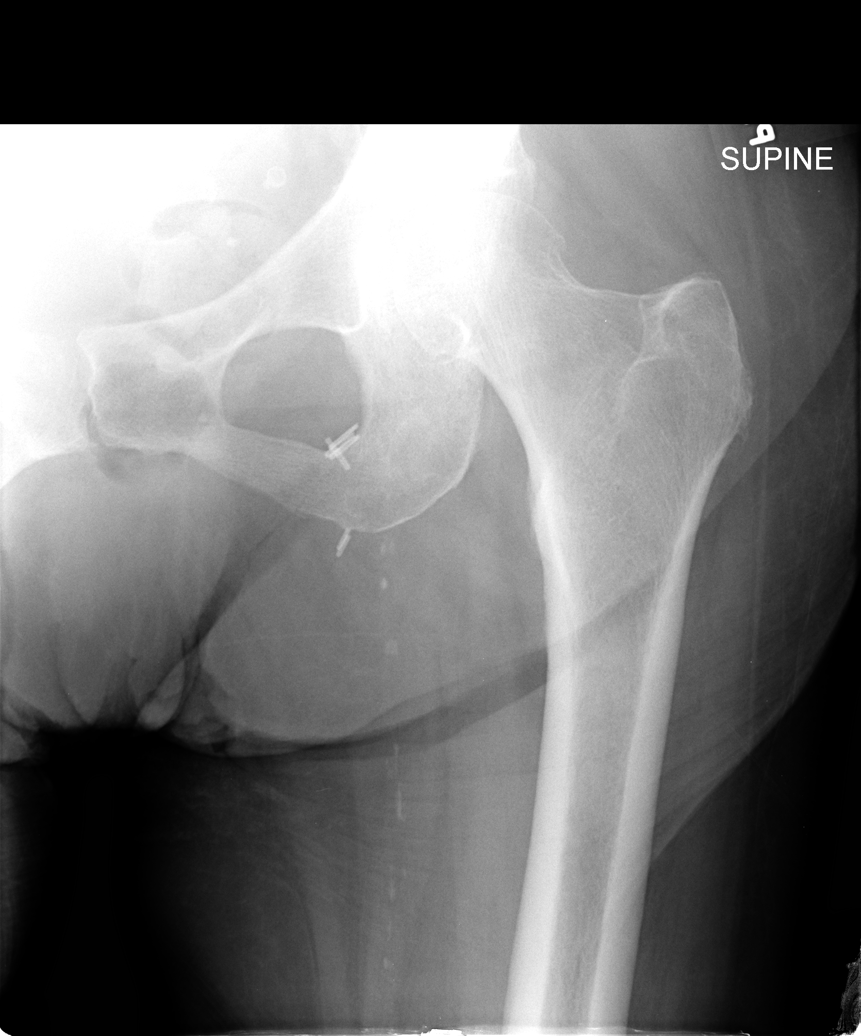

[view not recorded (4 of 5)]
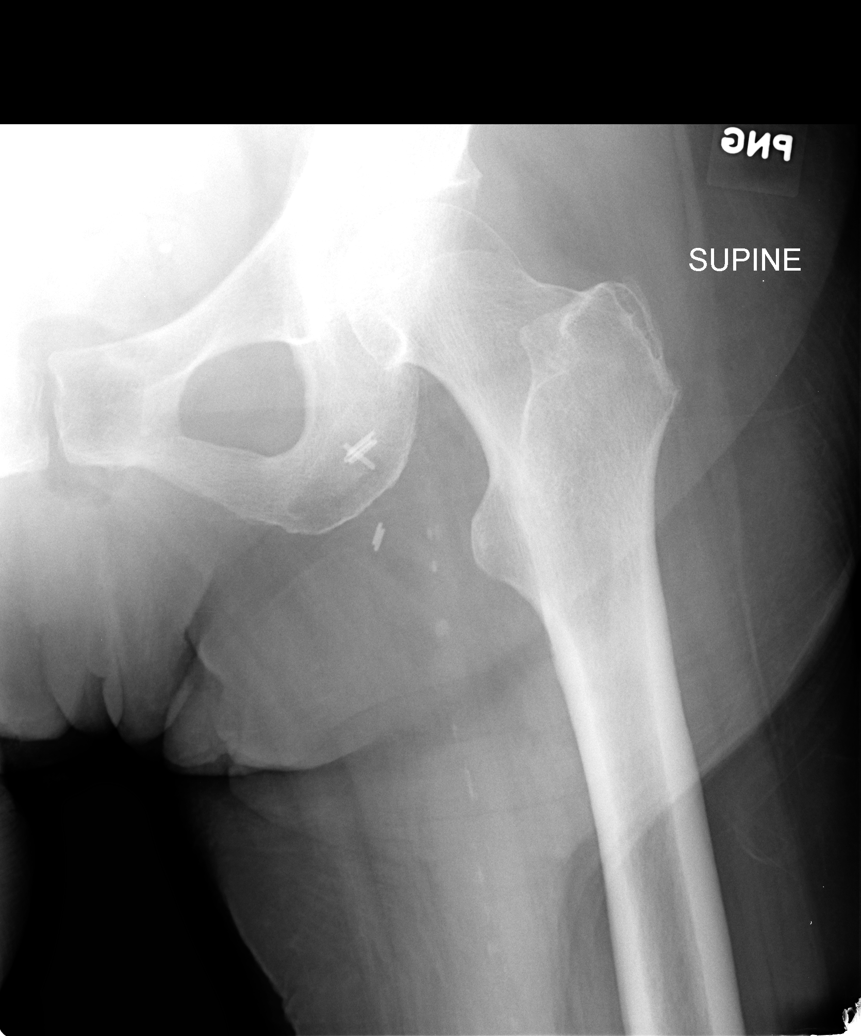

[view not recorded (5 of 5)]
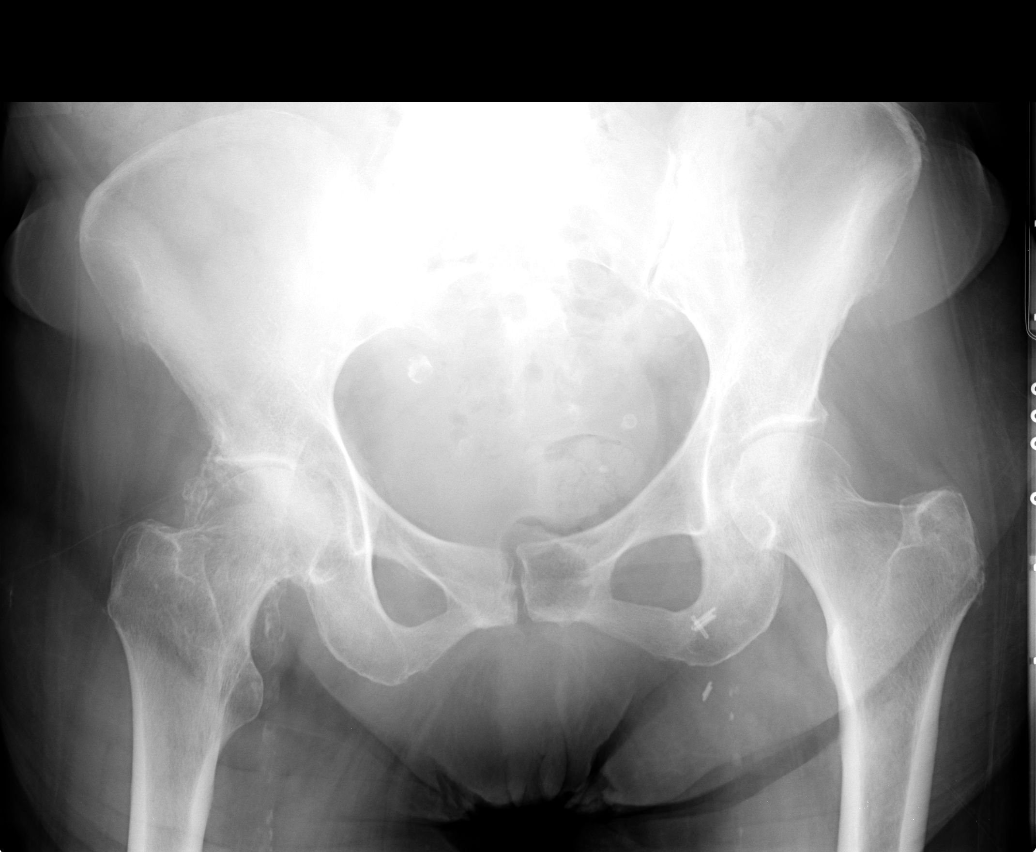

[5 of 5 positions shown; findings below may reference images not displayed]

FINDINGS: Femoral heads are located. Vascular calcifications. Sacroiliac
joints are symmetric. No acute fracture. Mild to moderate right hip
osteoarthritis, with weight-bearing surface joint space narrowing
and osteophyte formation.
IMPRESSION: Right hip osteoarthritis.  No acute superimposed process.

## 2016-02-01 DIAGNOSIS — S01501D Unspecified open wound of lip, subsequent encounter: Secondary | ICD-10-CM | POA: Diagnosis not present

## 2016-02-01 DIAGNOSIS — R296 Repeated falls: Secondary | ICD-10-CM | POA: Diagnosis not present

## 2016-02-01 DIAGNOSIS — Z9981 Dependence on supplemental oxygen: Secondary | ICD-10-CM | POA: Diagnosis not present

## 2016-02-01 DIAGNOSIS — W19XXXD Unspecified fall, subsequent encounter: Secondary | ICD-10-CM | POA: Diagnosis not present

## 2016-02-01 DIAGNOSIS — Z7982 Long term (current) use of aspirin: Secondary | ICD-10-CM | POA: Diagnosis not present

## 2016-02-01 DIAGNOSIS — S51802D Unspecified open wound of left forearm, subsequent encounter: Secondary | ICD-10-CM | POA: Diagnosis not present

## 2016-02-03 DIAGNOSIS — Z9981 Dependence on supplemental oxygen: Secondary | ICD-10-CM | POA: Diagnosis not present

## 2016-02-03 DIAGNOSIS — W19XXXD Unspecified fall, subsequent encounter: Secondary | ICD-10-CM | POA: Diagnosis not present

## 2016-02-03 DIAGNOSIS — S51802D Unspecified open wound of left forearm, subsequent encounter: Secondary | ICD-10-CM | POA: Diagnosis not present

## 2016-02-03 DIAGNOSIS — Z7982 Long term (current) use of aspirin: Secondary | ICD-10-CM | POA: Diagnosis not present

## 2016-02-03 DIAGNOSIS — S01501D Unspecified open wound of lip, subsequent encounter: Secondary | ICD-10-CM | POA: Diagnosis not present

## 2016-02-03 DIAGNOSIS — R296 Repeated falls: Secondary | ICD-10-CM | POA: Diagnosis not present

## 2016-02-04 ENCOUNTER — Telehealth: Payer: Self-pay | Admitting: Family Medicine

## 2016-02-04 NOTE — Telephone Encounter (Signed)
Danielle would like verbal order to continue PT for twice a wk for 3 more weeks

## 2016-02-06 DIAGNOSIS — N184 Chronic kidney disease, stage 4 (severe): Secondary | ICD-10-CM | POA: Diagnosis not present

## 2016-02-06 DIAGNOSIS — F0390 Unspecified dementia without behavioral disturbance: Secondary | ICD-10-CM | POA: Diagnosis not present

## 2016-02-06 DIAGNOSIS — M6281 Muscle weakness (generalized): Secondary | ICD-10-CM | POA: Diagnosis not present

## 2016-02-06 DIAGNOSIS — E1122 Type 2 diabetes mellitus with diabetic chronic kidney disease: Secondary | ICD-10-CM | POA: Diagnosis not present

## 2016-02-06 DIAGNOSIS — E1140 Type 2 diabetes mellitus with diabetic neuropathy, unspecified: Secondary | ICD-10-CM | POA: Diagnosis not present

## 2016-02-06 DIAGNOSIS — I129 Hypertensive chronic kidney disease with stage 1 through stage 4 chronic kidney disease, or unspecified chronic kidney disease: Secondary | ICD-10-CM | POA: Diagnosis not present

## 2016-02-07 DIAGNOSIS — E1122 Type 2 diabetes mellitus with diabetic chronic kidney disease: Secondary | ICD-10-CM | POA: Diagnosis not present

## 2016-02-07 DIAGNOSIS — F0390 Unspecified dementia without behavioral disturbance: Secondary | ICD-10-CM | POA: Diagnosis not present

## 2016-02-07 DIAGNOSIS — M6281 Muscle weakness (generalized): Secondary | ICD-10-CM | POA: Diagnosis not present

## 2016-02-07 DIAGNOSIS — I129 Hypertensive chronic kidney disease with stage 1 through stage 4 chronic kidney disease, or unspecified chronic kidney disease: Secondary | ICD-10-CM | POA: Diagnosis not present

## 2016-02-07 DIAGNOSIS — N184 Chronic kidney disease, stage 4 (severe): Secondary | ICD-10-CM | POA: Diagnosis not present

## 2016-02-07 DIAGNOSIS — E1140 Type 2 diabetes mellitus with diabetic neuropathy, unspecified: Secondary | ICD-10-CM | POA: Diagnosis not present

## 2016-02-07 NOTE — Telephone Encounter (Signed)
Please okay this order  ?

## 2016-02-07 NOTE — Telephone Encounter (Signed)
I left a voice message for Carla Friedman with verbal orders for below request.

## 2016-02-09 DIAGNOSIS — M6281 Muscle weakness (generalized): Secondary | ICD-10-CM | POA: Diagnosis not present

## 2016-02-09 DIAGNOSIS — F0390 Unspecified dementia without behavioral disturbance: Secondary | ICD-10-CM | POA: Diagnosis not present

## 2016-02-09 DIAGNOSIS — N184 Chronic kidney disease, stage 4 (severe): Secondary | ICD-10-CM | POA: Diagnosis not present

## 2016-02-09 DIAGNOSIS — E1122 Type 2 diabetes mellitus with diabetic chronic kidney disease: Secondary | ICD-10-CM | POA: Diagnosis not present

## 2016-02-09 DIAGNOSIS — E1140 Type 2 diabetes mellitus with diabetic neuropathy, unspecified: Secondary | ICD-10-CM | POA: Diagnosis not present

## 2016-02-09 DIAGNOSIS — I129 Hypertensive chronic kidney disease with stage 1 through stage 4 chronic kidney disease, or unspecified chronic kidney disease: Secondary | ICD-10-CM | POA: Diagnosis not present

## 2016-02-14 DIAGNOSIS — M6281 Muscle weakness (generalized): Secondary | ICD-10-CM | POA: Diagnosis not present

## 2016-02-14 DIAGNOSIS — I129 Hypertensive chronic kidney disease with stage 1 through stage 4 chronic kidney disease, or unspecified chronic kidney disease: Secondary | ICD-10-CM | POA: Diagnosis not present

## 2016-02-14 DIAGNOSIS — N184 Chronic kidney disease, stage 4 (severe): Secondary | ICD-10-CM | POA: Diagnosis not present

## 2016-02-14 DIAGNOSIS — E1140 Type 2 diabetes mellitus with diabetic neuropathy, unspecified: Secondary | ICD-10-CM | POA: Diagnosis not present

## 2016-02-14 DIAGNOSIS — E1122 Type 2 diabetes mellitus with diabetic chronic kidney disease: Secondary | ICD-10-CM | POA: Diagnosis not present

## 2016-02-14 DIAGNOSIS — F0390 Unspecified dementia without behavioral disturbance: Secondary | ICD-10-CM | POA: Diagnosis not present

## 2016-02-16 DIAGNOSIS — I129 Hypertensive chronic kidney disease with stage 1 through stage 4 chronic kidney disease, or unspecified chronic kidney disease: Secondary | ICD-10-CM | POA: Diagnosis not present

## 2016-02-16 DIAGNOSIS — E1122 Type 2 diabetes mellitus with diabetic chronic kidney disease: Secondary | ICD-10-CM | POA: Diagnosis not present

## 2016-02-16 DIAGNOSIS — M6281 Muscle weakness (generalized): Secondary | ICD-10-CM | POA: Diagnosis not present

## 2016-02-16 DIAGNOSIS — E1140 Type 2 diabetes mellitus with diabetic neuropathy, unspecified: Secondary | ICD-10-CM | POA: Diagnosis not present

## 2016-02-16 DIAGNOSIS — N184 Chronic kidney disease, stage 4 (severe): Secondary | ICD-10-CM | POA: Diagnosis not present

## 2016-02-16 DIAGNOSIS — F0390 Unspecified dementia without behavioral disturbance: Secondary | ICD-10-CM | POA: Diagnosis not present

## 2016-02-23 DIAGNOSIS — E1140 Type 2 diabetes mellitus with diabetic neuropathy, unspecified: Secondary | ICD-10-CM | POA: Diagnosis not present

## 2016-02-23 DIAGNOSIS — E1122 Type 2 diabetes mellitus with diabetic chronic kidney disease: Secondary | ICD-10-CM | POA: Diagnosis not present

## 2016-02-23 DIAGNOSIS — F0390 Unspecified dementia without behavioral disturbance: Secondary | ICD-10-CM | POA: Diagnosis not present

## 2016-02-23 DIAGNOSIS — M6281 Muscle weakness (generalized): Secondary | ICD-10-CM | POA: Diagnosis not present

## 2016-02-23 DIAGNOSIS — I129 Hypertensive chronic kidney disease with stage 1 through stage 4 chronic kidney disease, or unspecified chronic kidney disease: Secondary | ICD-10-CM | POA: Diagnosis not present

## 2016-02-23 DIAGNOSIS — N184 Chronic kidney disease, stage 4 (severe): Secondary | ICD-10-CM | POA: Diagnosis not present

## 2016-02-24 DIAGNOSIS — N184 Chronic kidney disease, stage 4 (severe): Secondary | ICD-10-CM | POA: Diagnosis not present

## 2016-02-24 DIAGNOSIS — I129 Hypertensive chronic kidney disease with stage 1 through stage 4 chronic kidney disease, or unspecified chronic kidney disease: Secondary | ICD-10-CM | POA: Diagnosis not present

## 2016-02-24 DIAGNOSIS — E1140 Type 2 diabetes mellitus with diabetic neuropathy, unspecified: Secondary | ICD-10-CM | POA: Diagnosis not present

## 2016-02-24 DIAGNOSIS — E1122 Type 2 diabetes mellitus with diabetic chronic kidney disease: Secondary | ICD-10-CM | POA: Diagnosis not present

## 2016-02-24 DIAGNOSIS — M6281 Muscle weakness (generalized): Secondary | ICD-10-CM | POA: Diagnosis not present

## 2016-02-24 DIAGNOSIS — F0390 Unspecified dementia without behavioral disturbance: Secondary | ICD-10-CM | POA: Diagnosis not present

## 2016-04-10 ENCOUNTER — Telehealth: Payer: Self-pay | Admitting: Family Medicine

## 2016-04-10 NOTE — Telephone Encounter (Signed)
noted 

## 2016-04-10 NOTE — Telephone Encounter (Signed)
Carla Friedman with [facility] would like you to know PT will start for pt tomorrow

## 2016-04-11 DIAGNOSIS — Z7982 Long term (current) use of aspirin: Secondary | ICD-10-CM | POA: Diagnosis not present

## 2016-04-11 DIAGNOSIS — F0390 Unspecified dementia without behavioral disturbance: Secondary | ICD-10-CM | POA: Diagnosis not present

## 2016-04-11 DIAGNOSIS — M6530 Trigger finger, unspecified finger: Secondary | ICD-10-CM | POA: Diagnosis not present

## 2016-04-11 DIAGNOSIS — R531 Weakness: Secondary | ICD-10-CM | POA: Diagnosis not present

## 2016-04-11 DIAGNOSIS — I10 Essential (primary) hypertension: Secondary | ICD-10-CM | POA: Diagnosis not present

## 2016-04-11 DIAGNOSIS — Z9181 History of falling: Secondary | ICD-10-CM | POA: Diagnosis not present

## 2016-04-12 ENCOUNTER — Telehealth: Payer: Self-pay | Admitting: Family Medicine

## 2016-04-12 NOTE — Telephone Encounter (Signed)
Carla Friedman with [facility] called to advise: Pt has a "halo" bed mobility devise attached to her bed. They need an order that pt can use this. They sent fax yesterday.  Any questions, please call

## 2016-04-13 ENCOUNTER — Telehealth: Payer: Self-pay | Admitting: Family Medicine

## 2016-04-13 NOTE — Telephone Encounter (Signed)
Orders okay? 

## 2016-04-13 NOTE — Telephone Encounter (Signed)
Form was filled out, signed and faxed.

## 2016-04-13 NOTE — Telephone Encounter (Signed)
° ° °  Grace call from [facility] to req orders for physical therapy 1 time a week for 1 week and then  2 times a week for 4 weeks    [phone]

## 2016-04-13 NOTE — Telephone Encounter (Signed)
Please call in these orders  

## 2016-04-14 NOTE — Telephone Encounter (Signed)
I spoke with Shirlee Limerick and gave verbal order, also pt will need to have a office visit because it has been over 90 days since being seen by primary, she will notify pt to schedule this.

## 2016-04-24 ENCOUNTER — Ambulatory Visit (INDEPENDENT_AMBULATORY_CARE_PROVIDER_SITE_OTHER): Payer: Medicare Other | Admitting: Family Medicine

## 2016-04-24 ENCOUNTER — Encounter: Payer: Self-pay | Admitting: Family Medicine

## 2016-04-24 VITALS — BP 152/71 | HR 77 | Temp 97.8°F | Ht 63.0 in | Wt 136.0 lb

## 2016-04-24 DIAGNOSIS — I10 Essential (primary) hypertension: Secondary | ICD-10-CM

## 2016-04-24 DIAGNOSIS — Z23 Encounter for immunization: Secondary | ICD-10-CM | POA: Diagnosis not present

## 2016-04-24 DIAGNOSIS — I2510 Atherosclerotic heart disease of native coronary artery without angina pectoris: Secondary | ICD-10-CM | POA: Diagnosis not present

## 2016-04-24 DIAGNOSIS — S76011A Strain of muscle, fascia and tendon of right hip, initial encounter: Secondary | ICD-10-CM | POA: Diagnosis not present

## 2016-04-24 DIAGNOSIS — R0789 Other chest pain: Secondary | ICD-10-CM

## 2016-04-24 MED ORDER — ISOSORBIDE DINITRATE 30 MG PO TABS: 60.0000 mg | ORAL_TABLET | Freq: Two times a day (BID) | ORAL | 3 refills | Status: DC

## 2016-04-24 NOTE — Progress Notes (Signed)
   Subjective:    Patient ID: Carla Friedman, female    DOB: Feb 11, 1934, 82 y.o.   MRN: [id number]  HPI Here for 2 things. First she had 2 episodes of chest pain last week, each time this was relieved by SL NTG. No SOB. Today she feels well. She has been on Isordil for several years at 30 mg bid. Also for several days she has had intermittent pains in the right groin region.    Review of Systems  Constitutional: Negative.   Respiratory: Negative.   Cardiovascular: Positive for chest pain. Negative for palpitations and leg swelling.  Musculoskeletal: Positive for arthralgias.  Neurological: Negative.        Objective:   Physical Exam  Constitutional: She appears well-developed and well-nourished. No distress.  Walks easily with her walker   Neck: No thyromegaly present.  Cardiovascular: Normal rate, regular rhythm, normal heart sounds and intact distal pulses.   Pulmonary/Chest: Effort normal and breath sounds normal.  Musculoskeletal:  Tender over the right hip flexor at the pelvic insertion, full ROM   Lymphadenopathy:    She has no cervical adenopathy.  Neurological: She is alert.          Assessment & Plan:  She has a hip flexor strain which should heal over the next few weeks. Use Tylenol prn. She had several episodes of angina last week so we will increase the Isordil dose to a total of 60 mg bid. Recheck prn.  Laurey Morale, MD

## 2016-04-24 NOTE — Addendum Note (Signed)
Addended by: Aggie Hacker A on: 04/24/2016 02:33 PM   Modules accepted: Orders

## 2016-04-24 NOTE — Progress Notes (Signed)
Pre visit review using our clinic review tool, if applicable. No additional management support is needed unless otherwise documented below in the visit note. 

## 2016-05-05 ENCOUNTER — Encounter: Payer: Self-pay | Admitting: Family Medicine

## 2016-05-09 NOTE — Telephone Encounter (Signed)
Yes the extra dose of Isosorbide could be causing the fatigue , so go back to the original dose and follow up with Dr. Johnsie Cancel

## 2016-05-19 ENCOUNTER — Encounter: Payer: Self-pay | Admitting: Family Medicine

## 2016-05-23 ENCOUNTER — Other Ambulatory Visit: Payer: Self-pay

## 2016-05-23 MED ORDER — ISOSORBIDE DINITRATE 30 MG PO TABS: 30.0000 mg | ORAL_TABLET | Freq: Two times a day (BID) | ORAL | 3 refills | Status: AC

## 2016-05-23 NOTE — Telephone Encounter (Signed)
Contact her facility and change the Isosorbide 30 mg to one tablet bid

## 2016-08-10 NOTE — Progress Notes (Deleted)
Patient ID: KIMBRIA MARTENS, female   DOB: July 26, 1933, 83 y.o.   MRN: [id number]   Zyniah is seen today for CAD, HTN, and elevated lipids. She has had CABG with stents to the native LAD and SVG to OM. She has distal disease and chronic chest pain. She actually is seen by hospice. She prefers not to be seen in hospital alot. She has less chest pain off lisinopril and on Atenolol. . Her last cath was in 11/2006 and she had a non-ischemic myovue in 11/2007. She has morphine and oxygen at home. She has sig. depression.   She has significant venous insuf with marked dependant discoloration but no arterial insuf.  Ranexa has helped her quite a bit.   Now at [facility]  Increasingly forgetful   Sometimes agitated   Memory is worse namenda not helpful No chest pain   Daughter with her today Husband may have new diagnosis of pancreatic cancer   ROS: Denies fever, malais, weight loss, blurry vision, decreased visual acuity, cough, sputum, SOB, hemoptysis, pleuritic pain, palpitaitons, heartburn, abdominal pain, melena, lower extremity edema, claudication, or rash.  All other systems reviewed and negative  General: Affect appropriate Frail elderly female  HEENT:  Large echymosis over left face  Neck supple with no adenopathy JVP normal no bruits no thyromegaly Lungs clear with no wheezing and good diaphragmatic motion Heart:  S1/S2 no murmur, no rub, gallop or click PMI normal Abdomen: benighn, BS positve, no tenderness, no AAA no bruit.  No HSM or HJR Distal pulses intact with no bruits No edema Neuro non-focal Skin warm and dry No muscular weakness   Current Outpatient Prescriptions  Medication Sig Dispense Refill  . amitriptyline (ELAVIL) 50 MG tablet Take 1 tablet (50 mg total) by mouth at bedtime. 90 tablet 3  . Ascorbic Acid (VITAMIN C) 500 MG tablet Take 500 mg by mouth daily.      Marland Kitchen aspirin EC 81 MG tablet Take 81 mg by mouth at bedtime.    Marland Kitchen atenolol (TENORMIN) 50 MG  tablet Take 1 tablet by mouth  every morning 90 tablet 1  . Calcium Carbonate (CALCIUM 600 PO) Take 600 mg by mouth daily.     . Cholecalciferol (VITAMIN D) 1000 UNITS capsule Take 1,000 Units by mouth daily.      . ciprofloxacin (CIPRO) 500 MG tablet Take 1 tablet (500 mg total) by mouth daily with breakfast. (Patient not taking: Reported on 04/24/2016) 4 tablet 0  . clopidogrel (PLAVIX) 75 MG tablet Take 1 tablet (75 mg total) by mouth daily. 90 tablet 3  . Cranberry ([employer] CRANBERRY) 500 MG CAPS Take 500 mg by mouth daily.    . isosorbide dinitrate (ISORDIL) 30 MG tablet Take 1 tablet (30 mg total) by mouth 2 (two) times daily. 360 tablet 3  . metFORMIN (GLUCOPHAGE) 1000 MG tablet TAKE ONE TABLET BY MOUTH ONCE DAILY WITH BREAKFAST 90 tablet 1  . metroNIDAZOLE (FLAGYL) 500 MG tablet Take 1 tablet (500 mg total) by mouth every 8 (eight) hours. (Patient not taking: Reported on 04/24/2016) 14 tablet 0  . Multiple Vitamin (MULTIVITAMIN) capsule Take 1 capsule by mouth daily.      . nitroGLYCERIN (NITROSTAT) 0.4 MG SL tablet Place 1 tablet (0.4 mg total) under the tongue every 5 (five) minutes as needed for chest pain (3 doses max). Reported on 08/31/2015 (Patient not taking: Reported on 04/24/2016) 25 tablet 5  . Omega-3 Fatty Acids (FISH OIL PO) Take 1 capsule by mouth  daily.    . ondansetron (ZOFRAN ODT) 4 MG disintegrating tablet Take 1 tablet (4 mg total) by mouth every 8 (eight) hours as needed for nausea. (Patient not taking: Reported on 04/24/2016) 6 tablet 0  . pravastatin (PRAVACHOL) 40 MG tablet Take 1 tablet (40 mg total) by mouth daily. 90 tablet 3  . ramipril (ALTACE) 10 MG capsule Take 1 capsule (10 mg total) by mouth daily. 90 capsule 1  . ranolazine (RANEXA) 500 MG 12 hr tablet Take 1 tablet (500 mg total) by mouth daily. 60 tablet 11  . valACYclovir (VALTREX) 500 MG tablet Take 1 tablet (500 mg total) by mouth 2 (two) times daily as needed (fever blisters). (Patient not taking: Reported on  04/24/2016) 20 tablet 5   No current facility-administered medications for this visit.     Allergies  Codeine; Morphine; Anbesol cold sore therapy [lip medex]; and Other  Electrocardiogram:  SR rate 64  LAD no acute ST/ T wave changes 11/15  07/01/15  SR rate 67 LAD nonspecific ST changes   Assessment and Plan CAD:  Stable with no angina and good activity level.  Continue medical Rx Ranexa has helped  No aggressive intervention given age and advanced dementia  UTI:  Resolved   Chol:  On statin labs with primary   HTN:  Well controlled.  Continue current medications and low sodium Dash type diet.    Dementia:  Advanced moved to assisted living at [facility] check is too high Stop Namenda and f/u with Dr Deliah Goody

## 2016-08-11 ENCOUNTER — Ambulatory Visit: Payer: Medicare Other | Admitting: Cardiovascular Disease

## 2016-08-15 ENCOUNTER — Telehealth: Payer: Self-pay | Admitting: Cardiovascular Disease

## 2016-08-15 ENCOUNTER — Telehealth: Payer: Self-pay | Admitting: Student

## 2016-08-15 NOTE — Telephone Encounter (Signed)
New Message      Pt daughter states the pt has been having chest pains, sob, (pt is on oxygen and has dementia) Had appointment but we were closed because of snow, does not want to see anyone but Dr Johnsie Cancel.

## 2016-08-15 NOTE — Telephone Encounter (Signed)
  Received call from patient's daughter that she is having more frequent episodes of chest pain. Was on Ranexa 500mg  BID previously but this was switched to Ranexa 500mg  daily due to financial restrictions and not having any pain.   Daughter requests to increase this back to BID dosing to help with her mother's symptoms. Unable to request PRN NTG due to AMS. She has dementia at baseline and has refused a cardiac catheterization in the past. Has scheduled follow-up with Dr. Johnsie Cancel on 1/29.  Will increase Ranexa to 500mg  BID to help with her symptoms. Rx for this was faxed over to [facility] [phone]).   Patient's daughter was appreciate of the call and had no further questions.  Signed, Erma Heritage, PA-C 08/15/2016, 7:35 PM Pager: [phone]

## 2016-08-15 NOTE — Telephone Encounter (Signed)
Patient is having chest pain at nights for a while now, according to her daughter, Drue Dun ([place]). Patient is staying at an assisted living facility, and is confused at night, to the point she is unable to call nurse for nitro.  Patient's daughter wants to see if Dr. Johnsie Cancel will increase patient's ranexa to BID. Patient's daughter stated this helped in the past. Patient's daughter stated that Dr. Johnsie Cancel knows their circumstance and that is why she would like an appointment with him only. Patient's daughter stated that Dr. Johnsie Cancel will probably suggest a heart cath, but patient has refused in the past. Put patient on schedule for Monday. Will forward to Dr. Johnsie Cancel to advise on increase on renexa.

## 2016-08-16 NOTE — Telephone Encounter (Signed)
Ok to increase ranexa to bid

## 2016-08-16 NOTE — Telephone Encounter (Signed)
Bernerd Pho PA spoke with daughter over the phone last night and faxed changes to [facility].

## 2016-08-16 NOTE — Progress Notes (Signed)
Patient ID: Carla Friedman, female   DOB: [birth year], 83 y.o.   MRN: [id number]   Carla Friedman is seen today for CAD, HTN, and elevated lipids. She has had CABG with stents to the native LAD and SVG to OM. She has distal disease and chronic chest pain. She actually is seen by hospice. She prefers not to be seen in hospital alot. She has less chest pain off lisinopril and on Atenolol. . Her last cath was in 11/2006 and she had a non-ischemic myovue in 11/2007. She has morphine and oxygen at home. She has sig. depression.   She has significant venous insuf with marked dependant discoloration but no arterial insuf.  Ranexa has helped her quite a bit.   Now at [facility]  Increasingly forgetful   Sometimes agitated   Memory is worse namenda not helpful No chest pain   Daughter with her today Husband had negative biopsy for his pancrease And she has some lesions on her liver that are being followed at [facility]  Ranexa increased to bid recently due to more chest pains Patient does not want cath  ROS: Denies fever, malais, weight loss, blurry vision, decreased visual acuity, cough, sputum, SOB, hemoptysis, pleuritic pain, palpitaitons, heartburn, abdominal pain, melena, lower extremity edema, claudication, or rash.  All other systems reviewed and negative  General: Affect appropriate Frail elderly female  HEENT:  Large echymosis over left face  Neck supple with no adenopathy JVP normal no bruits no thyromegaly Lungs clear with no wheezing and good diaphragmatic motion Heart:  S1/S2 no murmur, no rub, gallop or click PMI normal Abdomen: benighn, BS positve, no tenderness, no AAA no bruit.  No HSM or HJR Distal pulses intact with no bruits No edema Neuro non-focal Skin warm and dry No muscular weakness   Current Outpatient Prescriptions  Medication Sig Dispense Refill  . amitriptyline (ELAVIL) 50 MG tablet Take 1 tablet (50 mg total) by mouth at bedtime. 90 tablet 3  . Ascorbic  Acid (VITAMIN C) 500 MG tablet Take 500 mg by mouth daily.      Marland Kitchen aspirin EC 81 MG tablet Take 81 mg by mouth at bedtime.    Marland Kitchen atenolol (TENORMIN) 50 MG tablet Take 1 tablet by mouth  every morning 90 tablet 1  . Calcium Carbonate (CALCIUM 600 PO) Take 600 mg by mouth daily.     . Cholecalciferol (VITAMIN D) 1000 UNITS capsule Take 1,000 Units by mouth daily.      . ciprofloxacin (CIPRO) 500 MG tablet Take 1 tablet (500 mg total) by mouth 2 (two) times daily. 14 tablet 0  . clopidogrel (PLAVIX) 75 MG tablet Take 1 tablet (75 mg total) by mouth daily. 90 tablet 3  . Cranberry ([employer]S CRANBERRY) 500 MG CAPS Take 500 mg by mouth daily.    . isosorbide dinitrate (ISORDIL) 30 MG tablet Take 1 tablet (30 mg total) by mouth 2 (two) times daily. 360 tablet 3  . metFORMIN (GLUCOPHAGE) 1000 MG tablet TAKE ONE TABLET BY MOUTH ONCE DAILY WITH BREAKFAST 90 tablet 1  . metroNIDAZOLE (FLAGYL) 500 MG tablet Take 1 tablet (500 mg total) by mouth every 8 (eight) hours. 14 tablet 0  . Multiple Vitamin (MULTIVITAMIN) capsule Take 1 capsule by mouth daily.      . nitroGLYCERIN (NITROSTAT) 0.4 MG SL tablet Place 1 tablet (0.4 mg total) under the tongue every 5 (five) minutes as needed for chest pain (3 doses max). Reported on 08/31/2015 25 tablet 5  .  Omega-3 Fatty Acids (FISH OIL PO) Take 1 capsule by mouth daily.    . ondansetron (ZOFRAN ODT) 4 MG disintegrating tablet Take 1 tablet (4 mg total) by mouth every 8 (eight) hours as needed for nausea. 6 tablet 0  . pravastatin (PRAVACHOL) 40 MG tablet Take 1 tablet (40 mg total) by mouth daily. 90 tablet 3  . ramipril (ALTACE) 10 MG capsule Take 1 capsule (10 mg total) by mouth daily. 90 capsule 1  . ranolazine (RANEXA) 500 MG 12 hr tablet Take 500 mg by mouth 2 (two) times daily.    . valACYclovir (VALTREX) 500 MG tablet Take 1 tablet (500 mg total) by mouth 2 (two) times daily as needed (fever blisters). 20 tablet 5  . acetaminophen (TYLENOL) 325 MG tablet Take 2 tablets  (650 mg total) by mouth every 6 (six) hours as needed for mild pain or moderate pain.     No current facility-administered medications for this visit.     Allergies  Codeine; Morphine; Anbesol cold sore therapy [lip medex]; and Other  Electrocardiogram:  SR rate 64  LAD no acute ST/ T wave changes 11/15  07/01/15  SR rate 67 LAD nonspecific ST changes   Assessment and Plan CAD:  Ranexa increased to bid No aggressive intervention given age and advanced dementia  Chol:  On statin labs with primary   HTN:  Well controlled.  Continue current medications and low sodium Dash type diet.    Dementia:  Advanced moved to assisted living at [facility] check is too high Stop Namenda and f/u with Dr Deliah Goody

## 2016-08-18 ENCOUNTER — Encounter: Payer: Self-pay | Admitting: *Deleted

## 2016-08-18 ENCOUNTER — Telehealth: Payer: Self-pay

## 2016-08-18 MED ORDER — CIPROFLOXACIN HCL 500 MG PO TABS: 500.0000 mg | ORAL_TABLET | Freq: Two times a day (BID) | ORAL | 0 refills | Status: DC

## 2016-08-18 NOTE — Telephone Encounter (Signed)
Pt's daughter presented to office with urine sample from pt. Unfortunately sample was contaminated with stool. Daughter was advised that this specimen could not be used for any testing. She states that per [facility] pt needs abx for possible UTI and she was advised to bring urine sample.   Spoke with [place] @ [facility], she is Artist. She states that pt has had increased confusion x1 week with dark colored urine, strong odor and increased fatigue x3 days. She denies pt has hematuria, f/n/v or any known pain with urination. She suspects UTI and would like to know if pt can start abx therapy. She can have another sample sent to office for testing if needed.   Dr. Sarajane Jews - Please advise. Thanks!

## 2016-08-18 NOTE — Telephone Encounter (Signed)
Spoke to [place] @ [employer] and advised. Rx printed and faxed. Nothing further needed.

## 2016-08-18 NOTE — Telephone Encounter (Signed)
Call in Cipro 500 mg bid for 7 days  

## 2016-08-21 ENCOUNTER — Ambulatory Visit (INDEPENDENT_AMBULATORY_CARE_PROVIDER_SITE_OTHER): Payer: Medicare Other | Admitting: Cardiovascular Disease

## 2016-08-21 ENCOUNTER — Encounter: Payer: Self-pay | Admitting: Cardiovascular Disease

## 2016-08-21 VITALS — BP 134/60 | HR 85 | Ht 63.0 in | Wt 138.0 lb

## 2016-08-21 DIAGNOSIS — I2510 Atherosclerotic heart disease of native coronary artery without angina pectoris: Secondary | ICD-10-CM

## 2016-08-21 MED ORDER — ACETAMINOPHEN 325 MG PO TABS: 650.0000 mg | ORAL_TABLET | Freq: Four times a day (QID) | ORAL | Status: AC | PRN

## 2016-08-21 NOTE — Patient Instructions (Addendum)
Medication Instructions:  Your physician has recommended you make the following change in your medication:  1-Take Tylenol 650 mg by mouth every 6 hours has needed for pain.  Labwork: NONE  Testing/Procedures: NONE  Follow-Up: Your physician wants you to follow-up in: 6 months with Dr. Johnsie Cancel. You will receive a reminder letter in the mail two months in advance. If you don't receive a letter, please call our office to schedule the follow-up appointment.   If you need a refill on your cardiac medications before your next appointment, please call your pharmacy.

## 2016-08-23 ENCOUNTER — Ambulatory Visit (INDEPENDENT_AMBULATORY_CARE_PROVIDER_SITE_OTHER): Payer: Medicare Other | Admitting: Family Medicine

## 2016-08-23 ENCOUNTER — Encounter: Payer: Self-pay | Admitting: Family Medicine

## 2016-08-23 VITALS — BP 139/88 | HR 82 | Temp 98.1°F

## 2016-08-23 DIAGNOSIS — I2510 Atherosclerotic heart disease of native coronary artery without angina pectoris: Secondary | ICD-10-CM

## 2016-08-23 DIAGNOSIS — Z951 Presence of aortocoronary bypass graft: Secondary | ICD-10-CM

## 2016-08-23 DIAGNOSIS — R413 Other amnesia: Secondary | ICD-10-CM

## 2016-08-23 DIAGNOSIS — I10 Essential (primary) hypertension: Secondary | ICD-10-CM | POA: Diagnosis not present

## 2016-08-23 DIAGNOSIS — F0281 Dementia in other diseases classified elsewhere with behavioral disturbance: Secondary | ICD-10-CM

## 2016-08-23 DIAGNOSIS — N3000 Acute cystitis without hematuria: Secondary | ICD-10-CM | POA: Diagnosis not present

## 2016-08-23 DIAGNOSIS — F02818 Dementia in other diseases classified elsewhere, unspecified severity, with other behavioral disturbance: Secondary | ICD-10-CM

## 2016-08-23 MED ORDER — NITROFURANTOIN MONOHYD MACRO 100 MG PO CAPS: 100.0000 mg | ORAL_CAPSULE | Freq: Two times a day (BID) | ORAL | 0 refills | Status: AC

## 2016-08-23 NOTE — Progress Notes (Signed)
   Subjective:    Patient ID: Carla Friedman, female    DOB: 09-26-1933, 83 y.o.   MRN: [id number]  HPI Here with her daughter for several issues. First she has been taking Cipro for several days for an apparent UTI. We have not been able to test a sample but her urine has been dark and has a foul odor. No fever. Also her daughter asks if she could benefit from some PT. She has great difficulty walking even with her walker. She saw Dr. Johnsie Cancel a few days ago and her cardiac status seems to be stable. She had been having more chest pains, so he increased the Ranexa from once a day to twice a day. This has been helpful.    Review of Systems  Respiratory: Negative.   Cardiovascular: Positive for chest pain and leg swelling. Negative for palpitations.  Neurological: Positive for weakness.       Objective:   Physical Exam  Constitutional: She is oriented to person, place, and time.  In her wheelchair   Neck: No thyromegaly present.  Cardiovascular: Normal rate, regular rhythm, normal heart sounds and intact distal pulses.   Lymphadenopathy:    She has no cervical adenopathy.  Neurological: She is alert and oriented to person, place, and time.          Assessment & Plan:  Her UTI is not responding to Cipro, so we will switch to [facility] for 7 days. Her cardiac status is stable. We will order PT to work with her twice a week.  Alysia Penna, MD

## 2016-08-23 NOTE — Progress Notes (Signed)
Pre visit review using our clinic review tool, if applicable. No additional management support is needed unless otherwise documented below in the visit note. Pt unable to weigh 

## 2016-08-24 ENCOUNTER — Telehealth: Payer: Self-pay | Admitting: Family Medicine

## 2016-08-24 ENCOUNTER — Encounter: Payer: Self-pay | Admitting: Family Medicine

## 2016-08-24 ENCOUNTER — Encounter: Payer: Self-pay | Admitting: Cardiovascular Disease

## 2016-08-24 DIAGNOSIS — R413 Other amnesia: Secondary | ICD-10-CM

## 2016-08-24 DIAGNOSIS — F0391 Unspecified dementia with behavioral disturbance: Secondary | ICD-10-CM

## 2016-08-24 NOTE — Telephone Encounter (Signed)
Baxter Flattery w/[facility] need a order for the pt to have PT.

## 2016-08-25 ENCOUNTER — Telehealth: Payer: Self-pay | Admitting: Family Medicine

## 2016-08-25 NOTE — Telephone Encounter (Signed)
Spoke with Baxter Flattery again and gave verbal for PT if assessment shows need. Nothing further needed.

## 2016-08-25 NOTE — Telephone Encounter (Signed)
[facility] Fl[facility] needs verbal order for PT

## 2016-08-25 NOTE — Telephone Encounter (Signed)
Dr. Fry - Please advise. Thanks! 

## 2016-08-25 NOTE — Telephone Encounter (Signed)
Per Baxter Flattery they have not received any orders for PT. Pt has not been assessed yet. They need verbal order for assessment. Based on LOV note, order given. They will send results and recommendations once complete. Nothing further needed at this time.

## 2016-08-25 NOTE — Telephone Encounter (Signed)
I think referral to Hospice would be a great idea. I don't know if they could work wit her at [facility] or not. Please contact Hospice to initiate a referral

## 2016-08-25 NOTE — Telephone Encounter (Signed)
Carla Friedman with [facility] needs an OK for a PT start of care on  Monday, Feb 5 ,2018  This is for the eval  Ok to speak with [facility] Flattery or [employer]

## 2016-08-25 NOTE — Telephone Encounter (Signed)
See other TE. Done.

## 2016-08-28 DIAGNOSIS — R2689 Other abnormalities of gait and mobility: Secondary | ICD-10-CM | POA: Diagnosis not present

## 2016-08-28 DIAGNOSIS — Z7901 Long term (current) use of anticoagulants: Secondary | ICD-10-CM | POA: Diagnosis not present

## 2016-08-28 DIAGNOSIS — Z9181 History of falling: Secondary | ICD-10-CM | POA: Diagnosis not present

## 2016-08-28 DIAGNOSIS — I10 Essential (primary) hypertension: Secondary | ICD-10-CM | POA: Diagnosis not present

## 2016-08-28 DIAGNOSIS — F329 Major depressive disorder, single episode, unspecified: Secondary | ICD-10-CM | POA: Diagnosis not present

## 2016-08-28 DIAGNOSIS — Z7984 Long term (current) use of oral hypoglycemic drugs: Secondary | ICD-10-CM | POA: Diagnosis not present

## 2016-08-28 DIAGNOSIS — I2510 Atherosclerotic heart disease of native coronary artery without angina pectoris: Secondary | ICD-10-CM | POA: Diagnosis not present

## 2016-08-28 DIAGNOSIS — F0390 Unspecified dementia without behavioral disturbance: Secondary | ICD-10-CM | POA: Diagnosis not present

## 2016-08-28 DIAGNOSIS — E119 Type 2 diabetes mellitus without complications: Secondary | ICD-10-CM | POA: Diagnosis not present

## 2016-08-28 DIAGNOSIS — N390 Urinary tract infection, site not specified: Secondary | ICD-10-CM | POA: Diagnosis not present

## 2016-08-30 ENCOUNTER — Telehealth: Payer: Self-pay | Admitting: Family Medicine

## 2016-08-30 NOTE — Telephone Encounter (Signed)
Per Dr.Fry okay for PT orders, I spoke with Truman Hayward and gave okay with verbal orders.

## 2016-08-30 NOTE — Telephone Encounter (Signed)
Truman Hayward with [facility] would like home health PT verbal orders 2 wk / 4 1 wk /2

## 2016-08-31 ENCOUNTER — Encounter: Payer: Self-pay | Admitting: Cardiovascular Disease

## 2016-08-31 DIAGNOSIS — I2510 Atherosclerotic heart disease of native coronary artery without angina pectoris: Secondary | ICD-10-CM | POA: Diagnosis not present

## 2016-08-31 DIAGNOSIS — N390 Urinary tract infection, site not specified: Secondary | ICD-10-CM | POA: Diagnosis not present

## 2016-08-31 DIAGNOSIS — F329 Major depressive disorder, single episode, unspecified: Secondary | ICD-10-CM | POA: Diagnosis not present

## 2016-08-31 DIAGNOSIS — R2689 Other abnormalities of gait and mobility: Secondary | ICD-10-CM | POA: Diagnosis not present

## 2016-08-31 DIAGNOSIS — E119 Type 2 diabetes mellitus without complications: Secondary | ICD-10-CM | POA: Diagnosis not present

## 2016-08-31 DIAGNOSIS — F0390 Unspecified dementia without behavioral disturbance: Secondary | ICD-10-CM | POA: Diagnosis not present

## 2016-09-04 DIAGNOSIS — F0390 Unspecified dementia without behavioral disturbance: Secondary | ICD-10-CM | POA: Diagnosis not present

## 2016-09-04 DIAGNOSIS — E119 Type 2 diabetes mellitus without complications: Secondary | ICD-10-CM | POA: Diagnosis not present

## 2016-09-04 DIAGNOSIS — R2689 Other abnormalities of gait and mobility: Secondary | ICD-10-CM | POA: Diagnosis not present

## 2016-09-04 DIAGNOSIS — F329 Major depressive disorder, single episode, unspecified: Secondary | ICD-10-CM | POA: Diagnosis not present

## 2016-09-04 DIAGNOSIS — I2510 Atherosclerotic heart disease of native coronary artery without angina pectoris: Secondary | ICD-10-CM | POA: Diagnosis not present

## 2016-09-04 DIAGNOSIS — N390 Urinary tract infection, site not specified: Secondary | ICD-10-CM | POA: Diagnosis not present

## 2016-09-05 ENCOUNTER — Telehealth: Payer: Self-pay | Admitting: Family Medicine

## 2016-09-05 NOTE — Telephone Encounter (Signed)
error 

## 2016-09-06 DIAGNOSIS — E1159 Type 2 diabetes mellitus with other circulatory complications: Secondary | ICD-10-CM | POA: Diagnosis not present

## 2016-09-06 DIAGNOSIS — N390 Urinary tract infection, site not specified: Secondary | ICD-10-CM | POA: Diagnosis not present

## 2016-09-06 DIAGNOSIS — D638 Anemia in other chronic diseases classified elsewhere: Secondary | ICD-10-CM | POA: Diagnosis not present

## 2016-09-06 DIAGNOSIS — I739 Peripheral vascular disease, unspecified: Secondary | ICD-10-CM | POA: Diagnosis not present

## 2016-09-06 DIAGNOSIS — I25119 Atherosclerotic heart disease of native coronary artery with unspecified angina pectoris: Secondary | ICD-10-CM | POA: Diagnosis not present

## 2016-09-06 DIAGNOSIS — I209 Angina pectoris, unspecified: Secondary | ICD-10-CM | POA: Diagnosis not present

## 2016-09-06 DIAGNOSIS — F339 Major depressive disorder, recurrent, unspecified: Secondary | ICD-10-CM | POA: Diagnosis not present

## 2016-09-06 DIAGNOSIS — F0280 Dementia in other diseases classified elsewhere without behavioral disturbance: Secondary | ICD-10-CM | POA: Diagnosis not present

## 2016-09-06 DIAGNOSIS — E46 Unspecified protein-calorie malnutrition: Secondary | ICD-10-CM | POA: Diagnosis not present

## 2016-09-08 DIAGNOSIS — I209 Angina pectoris, unspecified: Secondary | ICD-10-CM | POA: Diagnosis not present

## 2016-09-08 DIAGNOSIS — E46 Unspecified protein-calorie malnutrition: Secondary | ICD-10-CM | POA: Diagnosis not present

## 2016-09-08 DIAGNOSIS — I25119 Atherosclerotic heart disease of native coronary artery with unspecified angina pectoris: Secondary | ICD-10-CM | POA: Diagnosis not present

## 2016-09-08 DIAGNOSIS — E1159 Type 2 diabetes mellitus with other circulatory complications: Secondary | ICD-10-CM | POA: Diagnosis not present

## 2016-09-08 DIAGNOSIS — D638 Anemia in other chronic diseases classified elsewhere: Secondary | ICD-10-CM | POA: Diagnosis not present

## 2016-09-08 DIAGNOSIS — N390 Urinary tract infection, site not specified: Secondary | ICD-10-CM | POA: Diagnosis not present

## 2016-09-10 DIAGNOSIS — E46 Unspecified protein-calorie malnutrition: Secondary | ICD-10-CM | POA: Diagnosis not present

## 2016-09-10 DIAGNOSIS — E1159 Type 2 diabetes mellitus with other circulatory complications: Secondary | ICD-10-CM | POA: Diagnosis not present

## 2016-09-10 DIAGNOSIS — I209 Angina pectoris, unspecified: Secondary | ICD-10-CM | POA: Diagnosis not present

## 2016-09-10 DIAGNOSIS — N390 Urinary tract infection, site not specified: Secondary | ICD-10-CM | POA: Diagnosis not present

## 2016-09-10 DIAGNOSIS — I25119 Atherosclerotic heart disease of native coronary artery with unspecified angina pectoris: Secondary | ICD-10-CM | POA: Diagnosis not present

## 2016-09-10 DIAGNOSIS — D638 Anemia in other chronic diseases classified elsewhere: Secondary | ICD-10-CM | POA: Diagnosis not present

## 2016-09-11 DIAGNOSIS — I209 Angina pectoris, unspecified: Secondary | ICD-10-CM | POA: Diagnosis not present

## 2016-09-11 DIAGNOSIS — D638 Anemia in other chronic diseases classified elsewhere: Secondary | ICD-10-CM | POA: Diagnosis not present

## 2016-09-11 DIAGNOSIS — E46 Unspecified protein-calorie malnutrition: Secondary | ICD-10-CM | POA: Diagnosis not present

## 2016-09-11 DIAGNOSIS — N390 Urinary tract infection, site not specified: Secondary | ICD-10-CM | POA: Diagnosis not present

## 2016-09-11 DIAGNOSIS — I25119 Atherosclerotic heart disease of native coronary artery with unspecified angina pectoris: Secondary | ICD-10-CM | POA: Diagnosis not present

## 2016-09-11 DIAGNOSIS — E1159 Type 2 diabetes mellitus with other circulatory complications: Secondary | ICD-10-CM | POA: Diagnosis not present

## 2016-09-12 DIAGNOSIS — E46 Unspecified protein-calorie malnutrition: Secondary | ICD-10-CM | POA: Diagnosis not present

## 2016-09-12 DIAGNOSIS — D638 Anemia in other chronic diseases classified elsewhere: Secondary | ICD-10-CM | POA: Diagnosis not present

## 2016-09-12 DIAGNOSIS — N390 Urinary tract infection, site not specified: Secondary | ICD-10-CM | POA: Diagnosis not present

## 2016-09-12 DIAGNOSIS — I25119 Atherosclerotic heart disease of native coronary artery with unspecified angina pectoris: Secondary | ICD-10-CM | POA: Diagnosis not present

## 2016-09-12 DIAGNOSIS — I209 Angina pectoris, unspecified: Secondary | ICD-10-CM | POA: Diagnosis not present

## 2016-09-12 DIAGNOSIS — E1159 Type 2 diabetes mellitus with other circulatory complications: Secondary | ICD-10-CM | POA: Diagnosis not present

## 2016-09-13 DIAGNOSIS — F0390 Unspecified dementia without behavioral disturbance: Secondary | ICD-10-CM | POA: Diagnosis not present

## 2016-09-16 DIAGNOSIS — N390 Urinary tract infection, site not specified: Secondary | ICD-10-CM | POA: Diagnosis not present

## 2016-09-16 DIAGNOSIS — D638 Anemia in other chronic diseases classified elsewhere: Secondary | ICD-10-CM | POA: Diagnosis not present

## 2016-09-16 DIAGNOSIS — I25119 Atherosclerotic heart disease of native coronary artery with unspecified angina pectoris: Secondary | ICD-10-CM | POA: Diagnosis not present

## 2016-09-16 DIAGNOSIS — I209 Angina pectoris, unspecified: Secondary | ICD-10-CM | POA: Diagnosis not present

## 2016-09-16 DIAGNOSIS — E1159 Type 2 diabetes mellitus with other circulatory complications: Secondary | ICD-10-CM | POA: Diagnosis not present

## 2016-09-16 DIAGNOSIS — E46 Unspecified protein-calorie malnutrition: Secondary | ICD-10-CM | POA: Diagnosis not present

## 2016-09-17 DIAGNOSIS — D638 Anemia in other chronic diseases classified elsewhere: Secondary | ICD-10-CM | POA: Diagnosis not present

## 2016-09-17 DIAGNOSIS — E46 Unspecified protein-calorie malnutrition: Secondary | ICD-10-CM | POA: Diagnosis not present

## 2016-09-17 DIAGNOSIS — I209 Angina pectoris, unspecified: Secondary | ICD-10-CM | POA: Diagnosis not present

## 2016-09-17 DIAGNOSIS — E1159 Type 2 diabetes mellitus with other circulatory complications: Secondary | ICD-10-CM | POA: Diagnosis not present

## 2016-09-17 DIAGNOSIS — N390 Urinary tract infection, site not specified: Secondary | ICD-10-CM | POA: Diagnosis not present

## 2016-09-17 DIAGNOSIS — I25119 Atherosclerotic heart disease of native coronary artery with unspecified angina pectoris: Secondary | ICD-10-CM | POA: Diagnosis not present

## 2016-09-19 DIAGNOSIS — I25119 Atherosclerotic heart disease of native coronary artery with unspecified angina pectoris: Secondary | ICD-10-CM | POA: Diagnosis not present

## 2016-09-19 DIAGNOSIS — I209 Angina pectoris, unspecified: Secondary | ICD-10-CM | POA: Diagnosis not present

## 2016-09-19 DIAGNOSIS — N390 Urinary tract infection, site not specified: Secondary | ICD-10-CM | POA: Diagnosis not present

## 2016-09-19 DIAGNOSIS — E46 Unspecified protein-calorie malnutrition: Secondary | ICD-10-CM | POA: Diagnosis not present

## 2016-09-19 DIAGNOSIS — D638 Anemia in other chronic diseases classified elsewhere: Secondary | ICD-10-CM | POA: Diagnosis not present

## 2016-09-19 DIAGNOSIS — E1159 Type 2 diabetes mellitus with other circulatory complications: Secondary | ICD-10-CM | POA: Diagnosis not present

## 2016-09-20 DIAGNOSIS — N390 Urinary tract infection, site not specified: Secondary | ICD-10-CM | POA: Diagnosis not present

## 2016-09-20 DIAGNOSIS — E1159 Type 2 diabetes mellitus with other circulatory complications: Secondary | ICD-10-CM | POA: Diagnosis not present

## 2016-09-20 DIAGNOSIS — D638 Anemia in other chronic diseases classified elsewhere: Secondary | ICD-10-CM | POA: Diagnosis not present

## 2016-09-20 DIAGNOSIS — I25119 Atherosclerotic heart disease of native coronary artery with unspecified angina pectoris: Secondary | ICD-10-CM | POA: Diagnosis not present

## 2016-09-20 DIAGNOSIS — I209 Angina pectoris, unspecified: Secondary | ICD-10-CM | POA: Diagnosis not present

## 2016-09-20 DIAGNOSIS — E46 Unspecified protein-calorie malnutrition: Secondary | ICD-10-CM | POA: Diagnosis not present

## 2016-09-21 DIAGNOSIS — I739 Peripheral vascular disease, unspecified: Secondary | ICD-10-CM | POA: Diagnosis not present

## 2016-09-21 DIAGNOSIS — I209 Angina pectoris, unspecified: Secondary | ICD-10-CM | POA: Diagnosis not present

## 2016-09-21 DIAGNOSIS — D638 Anemia in other chronic diseases classified elsewhere: Secondary | ICD-10-CM | POA: Diagnosis not present

## 2016-09-21 DIAGNOSIS — F339 Major depressive disorder, recurrent, unspecified: Secondary | ICD-10-CM | POA: Diagnosis not present

## 2016-09-21 DIAGNOSIS — E1159 Type 2 diabetes mellitus with other circulatory complications: Secondary | ICD-10-CM | POA: Diagnosis not present

## 2016-09-21 DIAGNOSIS — I25119 Atherosclerotic heart disease of native coronary artery with unspecified angina pectoris: Secondary | ICD-10-CM | POA: Diagnosis not present

## 2016-09-21 DIAGNOSIS — F0280 Dementia in other diseases classified elsewhere without behavioral disturbance: Secondary | ICD-10-CM | POA: Diagnosis not present

## 2016-09-21 DIAGNOSIS — N390 Urinary tract infection, site not specified: Secondary | ICD-10-CM | POA: Diagnosis not present

## 2016-09-21 DIAGNOSIS — E46 Unspecified protein-calorie malnutrition: Secondary | ICD-10-CM | POA: Diagnosis not present

## 2016-09-22 DIAGNOSIS — I209 Angina pectoris, unspecified: Secondary | ICD-10-CM | POA: Diagnosis not present

## 2016-09-22 DIAGNOSIS — E1159 Type 2 diabetes mellitus with other circulatory complications: Secondary | ICD-10-CM | POA: Diagnosis not present

## 2016-09-22 DIAGNOSIS — D638 Anemia in other chronic diseases classified elsewhere: Secondary | ICD-10-CM | POA: Diagnosis not present

## 2016-09-22 DIAGNOSIS — I25119 Atherosclerotic heart disease of native coronary artery with unspecified angina pectoris: Secondary | ICD-10-CM | POA: Diagnosis not present

## 2016-09-22 DIAGNOSIS — E46 Unspecified protein-calorie malnutrition: Secondary | ICD-10-CM | POA: Diagnosis not present

## 2016-09-22 DIAGNOSIS — N390 Urinary tract infection, site not specified: Secondary | ICD-10-CM | POA: Diagnosis not present

## 2016-09-23 DIAGNOSIS — I209 Angina pectoris, unspecified: Secondary | ICD-10-CM | POA: Diagnosis not present

## 2016-09-23 DIAGNOSIS — E46 Unspecified protein-calorie malnutrition: Secondary | ICD-10-CM | POA: Diagnosis not present

## 2016-09-23 DIAGNOSIS — I25119 Atherosclerotic heart disease of native coronary artery with unspecified angina pectoris: Secondary | ICD-10-CM | POA: Diagnosis not present

## 2016-09-23 DIAGNOSIS — E1159 Type 2 diabetes mellitus with other circulatory complications: Secondary | ICD-10-CM | POA: Diagnosis not present

## 2016-09-23 DIAGNOSIS — N390 Urinary tract infection, site not specified: Secondary | ICD-10-CM | POA: Diagnosis not present

## 2016-09-23 DIAGNOSIS — D638 Anemia in other chronic diseases classified elsewhere: Secondary | ICD-10-CM | POA: Diagnosis not present

## 2016-09-24 DIAGNOSIS — I25119 Atherosclerotic heart disease of native coronary artery with unspecified angina pectoris: Secondary | ICD-10-CM | POA: Diagnosis not present

## 2016-09-24 DIAGNOSIS — D638 Anemia in other chronic diseases classified elsewhere: Secondary | ICD-10-CM | POA: Diagnosis not present

## 2016-09-24 DIAGNOSIS — I209 Angina pectoris, unspecified: Secondary | ICD-10-CM | POA: Diagnosis not present

## 2016-09-24 DIAGNOSIS — E1159 Type 2 diabetes mellitus with other circulatory complications: Secondary | ICD-10-CM | POA: Diagnosis not present

## 2016-09-24 DIAGNOSIS — E46 Unspecified protein-calorie malnutrition: Secondary | ICD-10-CM | POA: Diagnosis not present

## 2016-09-24 DIAGNOSIS — N390 Urinary tract infection, site not specified: Secondary | ICD-10-CM | POA: Diagnosis not present

## 2016-09-25 DIAGNOSIS — E46 Unspecified protein-calorie malnutrition: Secondary | ICD-10-CM | POA: Diagnosis not present

## 2016-09-25 DIAGNOSIS — E1159 Type 2 diabetes mellitus with other circulatory complications: Secondary | ICD-10-CM | POA: Diagnosis not present

## 2016-09-25 DIAGNOSIS — I209 Angina pectoris, unspecified: Secondary | ICD-10-CM | POA: Diagnosis not present

## 2016-09-25 DIAGNOSIS — D638 Anemia in other chronic diseases classified elsewhere: Secondary | ICD-10-CM | POA: Diagnosis not present

## 2016-09-25 DIAGNOSIS — N390 Urinary tract infection, site not specified: Secondary | ICD-10-CM | POA: Diagnosis not present

## 2016-09-25 DIAGNOSIS — I25119 Atherosclerotic heart disease of native coronary artery with unspecified angina pectoris: Secondary | ICD-10-CM | POA: Diagnosis not present

## 2016-09-28 DIAGNOSIS — N390 Urinary tract infection, site not specified: Secondary | ICD-10-CM | POA: Diagnosis not present

## 2016-09-28 DIAGNOSIS — E1159 Type 2 diabetes mellitus with other circulatory complications: Secondary | ICD-10-CM | POA: Diagnosis not present

## 2016-09-28 DIAGNOSIS — D638 Anemia in other chronic diseases classified elsewhere: Secondary | ICD-10-CM | POA: Diagnosis not present

## 2016-09-28 DIAGNOSIS — I25119 Atherosclerotic heart disease of native coronary artery with unspecified angina pectoris: Secondary | ICD-10-CM | POA: Diagnosis not present

## 2016-09-28 DIAGNOSIS — I209 Angina pectoris, unspecified: Secondary | ICD-10-CM | POA: Diagnosis not present

## 2016-09-28 DIAGNOSIS — E46 Unspecified protein-calorie malnutrition: Secondary | ICD-10-CM | POA: Diagnosis not present

## 2016-09-29 DIAGNOSIS — E1159 Type 2 diabetes mellitus with other circulatory complications: Secondary | ICD-10-CM | POA: Diagnosis not present

## 2016-09-29 DIAGNOSIS — I25119 Atherosclerotic heart disease of native coronary artery with unspecified angina pectoris: Secondary | ICD-10-CM | POA: Diagnosis not present

## 2016-09-29 DIAGNOSIS — I209 Angina pectoris, unspecified: Secondary | ICD-10-CM | POA: Diagnosis not present

## 2016-09-29 DIAGNOSIS — N390 Urinary tract infection, site not specified: Secondary | ICD-10-CM | POA: Diagnosis not present

## 2016-09-29 DIAGNOSIS — D638 Anemia in other chronic diseases classified elsewhere: Secondary | ICD-10-CM | POA: Diagnosis not present

## 2016-09-29 DIAGNOSIS — E46 Unspecified protein-calorie malnutrition: Secondary | ICD-10-CM | POA: Diagnosis not present

## 2016-09-30 DIAGNOSIS — I209 Angina pectoris, unspecified: Secondary | ICD-10-CM | POA: Diagnosis not present

## 2016-09-30 DIAGNOSIS — I25119 Atherosclerotic heart disease of native coronary artery with unspecified angina pectoris: Secondary | ICD-10-CM | POA: Diagnosis not present

## 2016-09-30 DIAGNOSIS — D638 Anemia in other chronic diseases classified elsewhere: Secondary | ICD-10-CM | POA: Diagnosis not present

## 2016-09-30 DIAGNOSIS — E1159 Type 2 diabetes mellitus with other circulatory complications: Secondary | ICD-10-CM | POA: Diagnosis not present

## 2016-09-30 DIAGNOSIS — N390 Urinary tract infection, site not specified: Secondary | ICD-10-CM | POA: Diagnosis not present

## 2016-09-30 DIAGNOSIS — E46 Unspecified protein-calorie malnutrition: Secondary | ICD-10-CM | POA: Diagnosis not present

## 2016-10-02 DIAGNOSIS — I25119 Atherosclerotic heart disease of native coronary artery with unspecified angina pectoris: Secondary | ICD-10-CM | POA: Diagnosis not present

## 2016-10-02 DIAGNOSIS — E46 Unspecified protein-calorie malnutrition: Secondary | ICD-10-CM | POA: Diagnosis not present

## 2016-10-02 DIAGNOSIS — E1159 Type 2 diabetes mellitus with other circulatory complications: Secondary | ICD-10-CM | POA: Diagnosis not present

## 2016-10-02 DIAGNOSIS — N390 Urinary tract infection, site not specified: Secondary | ICD-10-CM | POA: Diagnosis not present

## 2016-10-02 DIAGNOSIS — D638 Anemia in other chronic diseases classified elsewhere: Secondary | ICD-10-CM | POA: Diagnosis not present

## 2016-10-02 DIAGNOSIS — I209 Angina pectoris, unspecified: Secondary | ICD-10-CM | POA: Diagnosis not present

## 2016-10-03 DIAGNOSIS — I209 Angina pectoris, unspecified: Secondary | ICD-10-CM | POA: Diagnosis not present

## 2016-10-03 DIAGNOSIS — E1159 Type 2 diabetes mellitus with other circulatory complications: Secondary | ICD-10-CM | POA: Diagnosis not present

## 2016-10-03 DIAGNOSIS — N390 Urinary tract infection, site not specified: Secondary | ICD-10-CM | POA: Diagnosis not present

## 2016-10-03 DIAGNOSIS — I25119 Atherosclerotic heart disease of native coronary artery with unspecified angina pectoris: Secondary | ICD-10-CM | POA: Diagnosis not present

## 2016-10-03 DIAGNOSIS — E46 Unspecified protein-calorie malnutrition: Secondary | ICD-10-CM | POA: Diagnosis not present

## 2016-10-03 DIAGNOSIS — D638 Anemia in other chronic diseases classified elsewhere: Secondary | ICD-10-CM | POA: Diagnosis not present

## 2016-10-04 DIAGNOSIS — D638 Anemia in other chronic diseases classified elsewhere: Secondary | ICD-10-CM | POA: Diagnosis not present

## 2016-10-04 DIAGNOSIS — E1159 Type 2 diabetes mellitus with other circulatory complications: Secondary | ICD-10-CM | POA: Diagnosis not present

## 2016-10-04 DIAGNOSIS — N390 Urinary tract infection, site not specified: Secondary | ICD-10-CM | POA: Diagnosis not present

## 2016-10-04 DIAGNOSIS — I25119 Atherosclerotic heart disease of native coronary artery with unspecified angina pectoris: Secondary | ICD-10-CM | POA: Diagnosis not present

## 2016-10-04 DIAGNOSIS — E46 Unspecified protein-calorie malnutrition: Secondary | ICD-10-CM | POA: Diagnosis not present

## 2016-10-04 DIAGNOSIS — I209 Angina pectoris, unspecified: Secondary | ICD-10-CM | POA: Diagnosis not present

## 2016-10-06 DIAGNOSIS — I209 Angina pectoris, unspecified: Secondary | ICD-10-CM | POA: Diagnosis not present

## 2016-10-06 DIAGNOSIS — I25119 Atherosclerotic heart disease of native coronary artery with unspecified angina pectoris: Secondary | ICD-10-CM | POA: Diagnosis not present

## 2016-10-06 DIAGNOSIS — E46 Unspecified protein-calorie malnutrition: Secondary | ICD-10-CM | POA: Diagnosis not present

## 2016-10-06 DIAGNOSIS — E1159 Type 2 diabetes mellitus with other circulatory complications: Secondary | ICD-10-CM | POA: Diagnosis not present

## 2016-10-06 DIAGNOSIS — N390 Urinary tract infection, site not specified: Secondary | ICD-10-CM | POA: Diagnosis not present

## 2016-10-06 DIAGNOSIS — D638 Anemia in other chronic diseases classified elsewhere: Secondary | ICD-10-CM | POA: Diagnosis not present

## 2016-10-09 DIAGNOSIS — E46 Unspecified protein-calorie malnutrition: Secondary | ICD-10-CM | POA: Diagnosis not present

## 2016-10-09 DIAGNOSIS — I209 Angina pectoris, unspecified: Secondary | ICD-10-CM | POA: Diagnosis not present

## 2016-10-09 DIAGNOSIS — E1159 Type 2 diabetes mellitus with other circulatory complications: Secondary | ICD-10-CM | POA: Diagnosis not present

## 2016-10-09 DIAGNOSIS — D638 Anemia in other chronic diseases classified elsewhere: Secondary | ICD-10-CM | POA: Diagnosis not present

## 2016-10-09 DIAGNOSIS — N390 Urinary tract infection, site not specified: Secondary | ICD-10-CM | POA: Diagnosis not present

## 2016-10-09 DIAGNOSIS — I25119 Atherosclerotic heart disease of native coronary artery with unspecified angina pectoris: Secondary | ICD-10-CM | POA: Diagnosis not present

## 2016-10-11 DIAGNOSIS — E46 Unspecified protein-calorie malnutrition: Secondary | ICD-10-CM | POA: Diagnosis not present

## 2016-10-11 DIAGNOSIS — E1159 Type 2 diabetes mellitus with other circulatory complications: Secondary | ICD-10-CM | POA: Diagnosis not present

## 2016-10-11 DIAGNOSIS — I209 Angina pectoris, unspecified: Secondary | ICD-10-CM | POA: Diagnosis not present

## 2016-10-11 DIAGNOSIS — D638 Anemia in other chronic diseases classified elsewhere: Secondary | ICD-10-CM | POA: Diagnosis not present

## 2016-10-11 DIAGNOSIS — N390 Urinary tract infection, site not specified: Secondary | ICD-10-CM | POA: Diagnosis not present

## 2016-10-11 DIAGNOSIS — I25119 Atherosclerotic heart disease of native coronary artery with unspecified angina pectoris: Secondary | ICD-10-CM | POA: Diagnosis not present

## 2016-10-12 DIAGNOSIS — E1159 Type 2 diabetes mellitus with other circulatory complications: Secondary | ICD-10-CM | POA: Diagnosis not present

## 2016-10-12 DIAGNOSIS — N390 Urinary tract infection, site not specified: Secondary | ICD-10-CM | POA: Diagnosis not present

## 2016-10-12 DIAGNOSIS — I25119 Atherosclerotic heart disease of native coronary artery with unspecified angina pectoris: Secondary | ICD-10-CM | POA: Diagnosis not present

## 2016-10-12 DIAGNOSIS — E46 Unspecified protein-calorie malnutrition: Secondary | ICD-10-CM | POA: Diagnosis not present

## 2016-10-12 DIAGNOSIS — D638 Anemia in other chronic diseases classified elsewhere: Secondary | ICD-10-CM | POA: Diagnosis not present

## 2016-10-12 DIAGNOSIS — I209 Angina pectoris, unspecified: Secondary | ICD-10-CM | POA: Diagnosis not present

## 2016-10-13 DIAGNOSIS — E46 Unspecified protein-calorie malnutrition: Secondary | ICD-10-CM | POA: Diagnosis not present

## 2016-10-13 DIAGNOSIS — I25119 Atherosclerotic heart disease of native coronary artery with unspecified angina pectoris: Secondary | ICD-10-CM | POA: Diagnosis not present

## 2016-10-13 DIAGNOSIS — I209 Angina pectoris, unspecified: Secondary | ICD-10-CM | POA: Diagnosis not present

## 2016-10-13 DIAGNOSIS — D638 Anemia in other chronic diseases classified elsewhere: Secondary | ICD-10-CM | POA: Diagnosis not present

## 2016-10-13 DIAGNOSIS — E1159 Type 2 diabetes mellitus with other circulatory complications: Secondary | ICD-10-CM | POA: Diagnosis not present

## 2016-10-13 DIAGNOSIS — N390 Urinary tract infection, site not specified: Secondary | ICD-10-CM | POA: Diagnosis not present

## 2016-10-14 DIAGNOSIS — E1159 Type 2 diabetes mellitus with other circulatory complications: Secondary | ICD-10-CM | POA: Diagnosis not present

## 2016-10-14 DIAGNOSIS — I25119 Atherosclerotic heart disease of native coronary artery with unspecified angina pectoris: Secondary | ICD-10-CM | POA: Diagnosis not present

## 2016-10-14 DIAGNOSIS — E46 Unspecified protein-calorie malnutrition: Secondary | ICD-10-CM | POA: Diagnosis not present

## 2016-10-14 DIAGNOSIS — N390 Urinary tract infection, site not specified: Secondary | ICD-10-CM | POA: Diagnosis not present

## 2016-10-14 DIAGNOSIS — D638 Anemia in other chronic diseases classified elsewhere: Secondary | ICD-10-CM | POA: Diagnosis not present

## 2016-10-14 DIAGNOSIS — I209 Angina pectoris, unspecified: Secondary | ICD-10-CM | POA: Diagnosis not present

## 2016-10-17 DIAGNOSIS — E1159 Type 2 diabetes mellitus with other circulatory complications: Secondary | ICD-10-CM | POA: Diagnosis not present

## 2016-10-17 DIAGNOSIS — I25119 Atherosclerotic heart disease of native coronary artery with unspecified angina pectoris: Secondary | ICD-10-CM | POA: Diagnosis not present

## 2016-10-17 DIAGNOSIS — I209 Angina pectoris, unspecified: Secondary | ICD-10-CM | POA: Diagnosis not present

## 2016-10-17 DIAGNOSIS — N390 Urinary tract infection, site not specified: Secondary | ICD-10-CM | POA: Diagnosis not present

## 2016-10-17 DIAGNOSIS — E46 Unspecified protein-calorie malnutrition: Secondary | ICD-10-CM | POA: Diagnosis not present

## 2016-10-17 DIAGNOSIS — D638 Anemia in other chronic diseases classified elsewhere: Secondary | ICD-10-CM | POA: Diagnosis not present

## 2016-10-18 DIAGNOSIS — N390 Urinary tract infection, site not specified: Secondary | ICD-10-CM | POA: Diagnosis not present

## 2016-10-18 DIAGNOSIS — E1159 Type 2 diabetes mellitus with other circulatory complications: Secondary | ICD-10-CM | POA: Diagnosis not present

## 2016-10-18 DIAGNOSIS — E46 Unspecified protein-calorie malnutrition: Secondary | ICD-10-CM | POA: Diagnosis not present

## 2016-10-18 DIAGNOSIS — D638 Anemia in other chronic diseases classified elsewhere: Secondary | ICD-10-CM | POA: Diagnosis not present

## 2016-10-18 DIAGNOSIS — I25119 Atherosclerotic heart disease of native coronary artery with unspecified angina pectoris: Secondary | ICD-10-CM | POA: Diagnosis not present

## 2016-10-18 DIAGNOSIS — I209 Angina pectoris, unspecified: Secondary | ICD-10-CM | POA: Diagnosis not present

## 2016-10-19 DIAGNOSIS — D638 Anemia in other chronic diseases classified elsewhere: Secondary | ICD-10-CM | POA: Diagnosis not present

## 2016-10-19 DIAGNOSIS — I209 Angina pectoris, unspecified: Secondary | ICD-10-CM | POA: Diagnosis not present

## 2016-10-19 DIAGNOSIS — E1159 Type 2 diabetes mellitus with other circulatory complications: Secondary | ICD-10-CM | POA: Diagnosis not present

## 2016-10-19 DIAGNOSIS — N390 Urinary tract infection, site not specified: Secondary | ICD-10-CM | POA: Diagnosis not present

## 2016-10-19 DIAGNOSIS — I25119 Atherosclerotic heart disease of native coronary artery with unspecified angina pectoris: Secondary | ICD-10-CM | POA: Diagnosis not present

## 2016-10-19 DIAGNOSIS — E46 Unspecified protein-calorie malnutrition: Secondary | ICD-10-CM | POA: Diagnosis not present

## 2016-10-20 DIAGNOSIS — E1159 Type 2 diabetes mellitus with other circulatory complications: Secondary | ICD-10-CM | POA: Diagnosis not present

## 2016-10-20 DIAGNOSIS — I25119 Atherosclerotic heart disease of native coronary artery with unspecified angina pectoris: Secondary | ICD-10-CM | POA: Diagnosis not present

## 2016-10-20 DIAGNOSIS — N390 Urinary tract infection, site not specified: Secondary | ICD-10-CM | POA: Diagnosis not present

## 2016-10-20 DIAGNOSIS — D638 Anemia in other chronic diseases classified elsewhere: Secondary | ICD-10-CM | POA: Diagnosis not present

## 2016-10-20 DIAGNOSIS — I209 Angina pectoris, unspecified: Secondary | ICD-10-CM | POA: Diagnosis not present

## 2016-10-20 DIAGNOSIS — E46 Unspecified protein-calorie malnutrition: Secondary | ICD-10-CM | POA: Diagnosis not present

## 2016-10-22 DIAGNOSIS — N390 Urinary tract infection, site not specified: Secondary | ICD-10-CM | POA: Diagnosis not present

## 2016-10-22 DIAGNOSIS — E1159 Type 2 diabetes mellitus with other circulatory complications: Secondary | ICD-10-CM | POA: Diagnosis not present

## 2016-10-22 DIAGNOSIS — I209 Angina pectoris, unspecified: Secondary | ICD-10-CM | POA: Diagnosis not present

## 2016-10-22 DIAGNOSIS — E46 Unspecified protein-calorie malnutrition: Secondary | ICD-10-CM | POA: Diagnosis not present

## 2016-10-22 DIAGNOSIS — I739 Peripheral vascular disease, unspecified: Secondary | ICD-10-CM | POA: Diagnosis not present

## 2016-10-22 DIAGNOSIS — F339 Major depressive disorder, recurrent, unspecified: Secondary | ICD-10-CM | POA: Diagnosis not present

## 2016-10-22 DIAGNOSIS — D638 Anemia in other chronic diseases classified elsewhere: Secondary | ICD-10-CM | POA: Diagnosis not present

## 2016-10-22 DIAGNOSIS — I25119 Atherosclerotic heart disease of native coronary artery with unspecified angina pectoris: Secondary | ICD-10-CM | POA: Diagnosis not present

## 2016-10-22 DIAGNOSIS — F0280 Dementia in other diseases classified elsewhere without behavioral disturbance: Secondary | ICD-10-CM | POA: Diagnosis not present

## 2016-10-24 DIAGNOSIS — E1159 Type 2 diabetes mellitus with other circulatory complications: Secondary | ICD-10-CM | POA: Diagnosis not present

## 2016-10-24 DIAGNOSIS — I209 Angina pectoris, unspecified: Secondary | ICD-10-CM | POA: Diagnosis not present

## 2016-10-24 DIAGNOSIS — E46 Unspecified protein-calorie malnutrition: Secondary | ICD-10-CM | POA: Diagnosis not present

## 2016-10-24 DIAGNOSIS — D638 Anemia in other chronic diseases classified elsewhere: Secondary | ICD-10-CM | POA: Diagnosis not present

## 2016-10-24 DIAGNOSIS — I25119 Atherosclerotic heart disease of native coronary artery with unspecified angina pectoris: Secondary | ICD-10-CM | POA: Diagnosis not present

## 2016-10-24 DIAGNOSIS — N390 Urinary tract infection, site not specified: Secondary | ICD-10-CM | POA: Diagnosis not present

## 2016-10-25 DIAGNOSIS — D638 Anemia in other chronic diseases classified elsewhere: Secondary | ICD-10-CM | POA: Diagnosis not present

## 2016-10-25 DIAGNOSIS — E1159 Type 2 diabetes mellitus with other circulatory complications: Secondary | ICD-10-CM | POA: Diagnosis not present

## 2016-10-25 DIAGNOSIS — I25119 Atherosclerotic heart disease of native coronary artery with unspecified angina pectoris: Secondary | ICD-10-CM | POA: Diagnosis not present

## 2016-10-25 DIAGNOSIS — N390 Urinary tract infection, site not specified: Secondary | ICD-10-CM | POA: Diagnosis not present

## 2016-10-25 DIAGNOSIS — E46 Unspecified protein-calorie malnutrition: Secondary | ICD-10-CM | POA: Diagnosis not present

## 2016-10-25 DIAGNOSIS — I209 Angina pectoris, unspecified: Secondary | ICD-10-CM | POA: Diagnosis not present

## 2016-10-26 DIAGNOSIS — E1159 Type 2 diabetes mellitus with other circulatory complications: Secondary | ICD-10-CM | POA: Diagnosis not present

## 2016-10-26 DIAGNOSIS — D638 Anemia in other chronic diseases classified elsewhere: Secondary | ICD-10-CM | POA: Diagnosis not present

## 2016-10-26 DIAGNOSIS — I25119 Atherosclerotic heart disease of native coronary artery with unspecified angina pectoris: Secondary | ICD-10-CM | POA: Diagnosis not present

## 2016-10-26 DIAGNOSIS — E46 Unspecified protein-calorie malnutrition: Secondary | ICD-10-CM | POA: Diagnosis not present

## 2016-10-26 DIAGNOSIS — N390 Urinary tract infection, site not specified: Secondary | ICD-10-CM | POA: Diagnosis not present

## 2016-10-26 DIAGNOSIS — I209 Angina pectoris, unspecified: Secondary | ICD-10-CM | POA: Diagnosis not present

## 2016-10-30 DIAGNOSIS — I25119 Atherosclerotic heart disease of native coronary artery with unspecified angina pectoris: Secondary | ICD-10-CM | POA: Diagnosis not present

## 2016-10-30 DIAGNOSIS — N390 Urinary tract infection, site not specified: Secondary | ICD-10-CM | POA: Diagnosis not present

## 2016-10-30 DIAGNOSIS — D638 Anemia in other chronic diseases classified elsewhere: Secondary | ICD-10-CM | POA: Diagnosis not present

## 2016-10-30 DIAGNOSIS — E46 Unspecified protein-calorie malnutrition: Secondary | ICD-10-CM | POA: Diagnosis not present

## 2016-10-30 DIAGNOSIS — I209 Angina pectoris, unspecified: Secondary | ICD-10-CM | POA: Diagnosis not present

## 2016-10-30 DIAGNOSIS — E1159 Type 2 diabetes mellitus with other circulatory complications: Secondary | ICD-10-CM | POA: Diagnosis not present

## 2016-11-01 DIAGNOSIS — I209 Angina pectoris, unspecified: Secondary | ICD-10-CM | POA: Diagnosis not present

## 2016-11-01 DIAGNOSIS — E1159 Type 2 diabetes mellitus with other circulatory complications: Secondary | ICD-10-CM | POA: Diagnosis not present

## 2016-11-01 DIAGNOSIS — E46 Unspecified protein-calorie malnutrition: Secondary | ICD-10-CM | POA: Diagnosis not present

## 2016-11-01 DIAGNOSIS — D638 Anemia in other chronic diseases classified elsewhere: Secondary | ICD-10-CM | POA: Diagnosis not present

## 2016-11-01 DIAGNOSIS — N390 Urinary tract infection, site not specified: Secondary | ICD-10-CM | POA: Diagnosis not present

## 2016-11-01 DIAGNOSIS — I25119 Atherosclerotic heart disease of native coronary artery with unspecified angina pectoris: Secondary | ICD-10-CM | POA: Diagnosis not present

## 2016-11-02 DIAGNOSIS — I25119 Atherosclerotic heart disease of native coronary artery with unspecified angina pectoris: Secondary | ICD-10-CM | POA: Diagnosis not present

## 2016-11-02 DIAGNOSIS — E46 Unspecified protein-calorie malnutrition: Secondary | ICD-10-CM | POA: Diagnosis not present

## 2016-11-02 DIAGNOSIS — D638 Anemia in other chronic diseases classified elsewhere: Secondary | ICD-10-CM | POA: Diagnosis not present

## 2016-11-02 DIAGNOSIS — I209 Angina pectoris, unspecified: Secondary | ICD-10-CM | POA: Diagnosis not present

## 2016-11-02 DIAGNOSIS — N390 Urinary tract infection, site not specified: Secondary | ICD-10-CM | POA: Diagnosis not present

## 2016-11-02 DIAGNOSIS — E1159 Type 2 diabetes mellitus with other circulatory complications: Secondary | ICD-10-CM | POA: Diagnosis not present

## 2016-11-04 DIAGNOSIS — N390 Urinary tract infection, site not specified: Secondary | ICD-10-CM | POA: Diagnosis not present

## 2016-11-04 DIAGNOSIS — E1159 Type 2 diabetes mellitus with other circulatory complications: Secondary | ICD-10-CM | POA: Diagnosis not present

## 2016-11-04 DIAGNOSIS — E46 Unspecified protein-calorie malnutrition: Secondary | ICD-10-CM | POA: Diagnosis not present

## 2016-11-04 DIAGNOSIS — D638 Anemia in other chronic diseases classified elsewhere: Secondary | ICD-10-CM | POA: Diagnosis not present

## 2016-11-04 DIAGNOSIS — I209 Angina pectoris, unspecified: Secondary | ICD-10-CM | POA: Diagnosis not present

## 2016-11-04 DIAGNOSIS — I25119 Atherosclerotic heart disease of native coronary artery with unspecified angina pectoris: Secondary | ICD-10-CM | POA: Diagnosis not present

## 2016-11-05 DIAGNOSIS — N390 Urinary tract infection, site not specified: Secondary | ICD-10-CM | POA: Diagnosis not present

## 2016-11-05 DIAGNOSIS — I25119 Atherosclerotic heart disease of native coronary artery with unspecified angina pectoris: Secondary | ICD-10-CM | POA: Diagnosis not present

## 2016-11-05 DIAGNOSIS — E1159 Type 2 diabetes mellitus with other circulatory complications: Secondary | ICD-10-CM | POA: Diagnosis not present

## 2016-11-05 DIAGNOSIS — D638 Anemia in other chronic diseases classified elsewhere: Secondary | ICD-10-CM | POA: Diagnosis not present

## 2016-11-05 DIAGNOSIS — I209 Angina pectoris, unspecified: Secondary | ICD-10-CM | POA: Diagnosis not present

## 2016-11-05 DIAGNOSIS — E46 Unspecified protein-calorie malnutrition: Secondary | ICD-10-CM | POA: Diagnosis not present

## 2016-11-06 DIAGNOSIS — E46 Unspecified protein-calorie malnutrition: Secondary | ICD-10-CM | POA: Diagnosis not present

## 2016-11-06 DIAGNOSIS — E1159 Type 2 diabetes mellitus with other circulatory complications: Secondary | ICD-10-CM | POA: Diagnosis not present

## 2016-11-06 DIAGNOSIS — I209 Angina pectoris, unspecified: Secondary | ICD-10-CM | POA: Diagnosis not present

## 2016-11-06 DIAGNOSIS — I25119 Atherosclerotic heart disease of native coronary artery with unspecified angina pectoris: Secondary | ICD-10-CM | POA: Diagnosis not present

## 2016-11-06 DIAGNOSIS — N390 Urinary tract infection, site not specified: Secondary | ICD-10-CM | POA: Diagnosis not present

## 2016-11-06 DIAGNOSIS — D638 Anemia in other chronic diseases classified elsewhere: Secondary | ICD-10-CM | POA: Diagnosis not present

## 2016-11-08 DIAGNOSIS — E1159 Type 2 diabetes mellitus with other circulatory complications: Secondary | ICD-10-CM | POA: Diagnosis not present

## 2016-11-08 DIAGNOSIS — I25119 Atherosclerotic heart disease of native coronary artery with unspecified angina pectoris: Secondary | ICD-10-CM | POA: Diagnosis not present

## 2016-11-08 DIAGNOSIS — D638 Anemia in other chronic diseases classified elsewhere: Secondary | ICD-10-CM | POA: Diagnosis not present

## 2016-11-08 DIAGNOSIS — I209 Angina pectoris, unspecified: Secondary | ICD-10-CM | POA: Diagnosis not present

## 2016-11-08 DIAGNOSIS — N390 Urinary tract infection, site not specified: Secondary | ICD-10-CM | POA: Diagnosis not present

## 2016-11-08 DIAGNOSIS — E46 Unspecified protein-calorie malnutrition: Secondary | ICD-10-CM | POA: Diagnosis not present

## 2016-11-09 DIAGNOSIS — I25119 Atherosclerotic heart disease of native coronary artery with unspecified angina pectoris: Secondary | ICD-10-CM | POA: Diagnosis not present

## 2016-11-09 DIAGNOSIS — N390 Urinary tract infection, site not specified: Secondary | ICD-10-CM | POA: Diagnosis not present

## 2016-11-09 DIAGNOSIS — E1159 Type 2 diabetes mellitus with other circulatory complications: Secondary | ICD-10-CM | POA: Diagnosis not present

## 2016-11-09 DIAGNOSIS — D638 Anemia in other chronic diseases classified elsewhere: Secondary | ICD-10-CM | POA: Diagnosis not present

## 2016-11-09 DIAGNOSIS — I209 Angina pectoris, unspecified: Secondary | ICD-10-CM | POA: Diagnosis not present

## 2016-11-09 DIAGNOSIS — E46 Unspecified protein-calorie malnutrition: Secondary | ICD-10-CM | POA: Diagnosis not present

## 2016-11-13 ENCOUNTER — Telehealth: Payer: Self-pay | Admitting: Family Medicine

## 2016-11-13 DIAGNOSIS — E1159 Type 2 diabetes mellitus with other circulatory complications: Secondary | ICD-10-CM | POA: Diagnosis not present

## 2016-11-13 DIAGNOSIS — I209 Angina pectoris, unspecified: Secondary | ICD-10-CM | POA: Diagnosis not present

## 2016-11-13 DIAGNOSIS — D638 Anemia in other chronic diseases classified elsewhere: Secondary | ICD-10-CM | POA: Diagnosis not present

## 2016-11-13 DIAGNOSIS — E46 Unspecified protein-calorie malnutrition: Secondary | ICD-10-CM | POA: Diagnosis not present

## 2016-11-13 DIAGNOSIS — N390 Urinary tract infection, site not specified: Secondary | ICD-10-CM | POA: Diagnosis not present

## 2016-11-13 DIAGNOSIS — I25119 Atherosclerotic heart disease of native coronary artery with unspecified angina pectoris: Secondary | ICD-10-CM | POA: Diagnosis not present

## 2016-11-13 NOTE — Telephone Encounter (Signed)
Pt is having increase of chest pain with worsening of shortness of breath and anxiety.  Can we have a order for lorazepam for management of shortness of breath and anxiety.  Daughter has request something for pain other than nitroglycerin.  Pt has allergy to codeine and morphine.  Daughter has requested to D/C calcium and centrum multi vitamin.  Darlina Guys would like to have a f/u call.

## 2016-11-13 NOTE — Telephone Encounter (Signed)
I agree with stopping the calcium and mulitvitamins. For pain call in Tramadol 50 mg to take 1-2 tabs every 6 hours prn pain, #120 with 2 rf. Also call in Lorazepam 1 mg to take every 8 hours prn anxiety, #90 with 2 rf.

## 2016-11-14 DIAGNOSIS — I25119 Atherosclerotic heart disease of native coronary artery with unspecified angina pectoris: Secondary | ICD-10-CM | POA: Diagnosis not present

## 2016-11-14 DIAGNOSIS — E46 Unspecified protein-calorie malnutrition: Secondary | ICD-10-CM | POA: Diagnosis not present

## 2016-11-14 DIAGNOSIS — I209 Angina pectoris, unspecified: Secondary | ICD-10-CM | POA: Diagnosis not present

## 2016-11-14 DIAGNOSIS — D638 Anemia in other chronic diseases classified elsewhere: Secondary | ICD-10-CM | POA: Diagnosis not present

## 2016-11-14 DIAGNOSIS — E1159 Type 2 diabetes mellitus with other circulatory complications: Secondary | ICD-10-CM | POA: Diagnosis not present

## 2016-11-14 DIAGNOSIS — N390 Urinary tract infection, site not specified: Secondary | ICD-10-CM | POA: Diagnosis not present

## 2016-11-14 MED ORDER — LORAZEPAM 1 MG PO TABS: 1.0000 mg | ORAL_TABLET | Freq: Three times a day (TID) | ORAL | 3 refills | Status: DC

## 2016-11-14 MED ORDER — TRAMADOL HCL 50 MG PO TABS: 50.0000 mg | ORAL_TABLET | Freq: Four times a day (QID) | ORAL | 3 refills | Status: AC | PRN

## 2016-11-14 NOTE — Telephone Encounter (Signed)
Rx's signed and faxed to [place] @ [address] Nothing further needed at this time.

## 2016-11-14 NOTE — Telephone Encounter (Signed)
Spoke with [facility] @ [facility]osp[facility] and advised of recommendations. She has asked for rx's to be printed and faxed to facility, Malachy Mood attn: [place], [phone].  Rx's printed for Dr. Sarajane Jews to sign.

## 2016-11-15 ENCOUNTER — Encounter: Payer: Self-pay | Admitting: Family Medicine

## 2016-11-15 DIAGNOSIS — N390 Urinary tract infection, site not specified: Secondary | ICD-10-CM | POA: Diagnosis not present

## 2016-11-15 DIAGNOSIS — I209 Angina pectoris, unspecified: Secondary | ICD-10-CM | POA: Diagnosis not present

## 2016-11-15 DIAGNOSIS — D638 Anemia in other chronic diseases classified elsewhere: Secondary | ICD-10-CM | POA: Diagnosis not present

## 2016-11-15 DIAGNOSIS — E46 Unspecified protein-calorie malnutrition: Secondary | ICD-10-CM | POA: Diagnosis not present

## 2016-11-15 DIAGNOSIS — I25119 Atherosclerotic heart disease of native coronary artery with unspecified angina pectoris: Secondary | ICD-10-CM | POA: Diagnosis not present

## 2016-11-15 DIAGNOSIS — E1159 Type 2 diabetes mellitus with other circulatory complications: Secondary | ICD-10-CM | POA: Diagnosis not present

## 2016-11-16 ENCOUNTER — Telehealth: Payer: Self-pay | Admitting: Family Medicine

## 2016-11-16 DIAGNOSIS — D638 Anemia in other chronic diseases classified elsewhere: Secondary | ICD-10-CM | POA: Diagnosis not present

## 2016-11-16 DIAGNOSIS — N390 Urinary tract infection, site not specified: Secondary | ICD-10-CM | POA: Diagnosis not present

## 2016-11-16 DIAGNOSIS — I25119 Atherosclerotic heart disease of native coronary artery with unspecified angina pectoris: Secondary | ICD-10-CM | POA: Diagnosis not present

## 2016-11-16 DIAGNOSIS — E1159 Type 2 diabetes mellitus with other circulatory complications: Secondary | ICD-10-CM | POA: Diagnosis not present

## 2016-11-16 DIAGNOSIS — E46 Unspecified protein-calorie malnutrition: Secondary | ICD-10-CM | POA: Diagnosis not present

## 2016-11-16 DIAGNOSIS — I209 Angina pectoris, unspecified: Secondary | ICD-10-CM | POA: Diagnosis not present

## 2016-11-16 NOTE — Telephone Encounter (Signed)
Please fax another rxs for tramadol and lorazepam send to [facility] [phone] and they will fax to [facility].

## 2016-11-17 NOTE — Telephone Encounter (Signed)
I faxed scripts again to below number

## 2016-11-19 ENCOUNTER — Encounter: Payer: Self-pay | Admitting: Family Medicine

## 2016-11-19 DIAGNOSIS — I25119 Atherosclerotic heart disease of native coronary artery with unspecified angina pectoris: Secondary | ICD-10-CM | POA: Diagnosis not present

## 2016-11-19 DIAGNOSIS — D638 Anemia in other chronic diseases classified elsewhere: Secondary | ICD-10-CM | POA: Diagnosis not present

## 2016-11-19 DIAGNOSIS — E46 Unspecified protein-calorie malnutrition: Secondary | ICD-10-CM | POA: Diagnosis not present

## 2016-11-19 DIAGNOSIS — N390 Urinary tract infection, site not specified: Secondary | ICD-10-CM | POA: Diagnosis not present

## 2016-11-19 DIAGNOSIS — I209 Angina pectoris, unspecified: Secondary | ICD-10-CM | POA: Diagnosis not present

## 2016-11-19 DIAGNOSIS — E1159 Type 2 diabetes mellitus with other circulatory complications: Secondary | ICD-10-CM | POA: Diagnosis not present

## 2016-11-20 ENCOUNTER — Encounter: Payer: Self-pay | Admitting: Family Medicine

## 2016-11-20 ENCOUNTER — Telehealth: Payer: Self-pay | Admitting: Family Medicine

## 2016-11-20 ENCOUNTER — Other Ambulatory Visit: Payer: Self-pay

## 2016-11-20 DIAGNOSIS — N390 Urinary tract infection, site not specified: Secondary | ICD-10-CM | POA: Diagnosis not present

## 2016-11-20 DIAGNOSIS — I25119 Atherosclerotic heart disease of native coronary artery with unspecified angina pectoris: Secondary | ICD-10-CM | POA: Diagnosis not present

## 2016-11-20 DIAGNOSIS — D638 Anemia in other chronic diseases classified elsewhere: Secondary | ICD-10-CM | POA: Diagnosis not present

## 2016-11-20 DIAGNOSIS — I209 Angina pectoris, unspecified: Secondary | ICD-10-CM | POA: Diagnosis not present

## 2016-11-20 DIAGNOSIS — E46 Unspecified protein-calorie malnutrition: Secondary | ICD-10-CM | POA: Diagnosis not present

## 2016-11-20 DIAGNOSIS — E1159 Type 2 diabetes mellitus with other circulatory complications: Secondary | ICD-10-CM | POA: Diagnosis not present

## 2016-11-20 MED ORDER — LORAZEPAM 1 MG PO TABS: 0.5000 mg | ORAL_TABLET | Freq: Three times a day (TID) | ORAL | 3 refills | Status: AC | PRN

## 2016-11-20 NOTE — Telephone Encounter (Signed)
I faxed script with new directions for Ativan 1 mg take 1/2 tablet every 8 hours and to [phone]

## 2016-11-20 NOTE — Telephone Encounter (Signed)
I agree the medication should be stopped. Take this off her list. I dont think it directly affected her lungs but it can make her breathe in a shallow manner, thus the wheezing. This should wear off as the medication clears from her system. I would watch her closely today and let us know how she is doing early this afternoon.

## 2016-11-20 NOTE — Telephone Encounter (Signed)
[facility] would like to decrease ativan 1 mg to 0.5 mg as needed

## 2016-11-20 NOTE — Telephone Encounter (Signed)
I spoke daughter Alyse Low and the issue with medication has been resolved. Pt was seen by hospice provider on Sunday 11/19/2016 and has fluid on lungs, started her on Lasix and nebulizing treatments. Pt is still taking the Ativan and it is helping with anxiety. Per Dr. Sarajane Jews okay to keep on Ativan but take 1/2 tablet every 8 hours.

## 2016-11-20 NOTE — Telephone Encounter (Signed)
I left a message with below information.  

## 2016-11-20 NOTE — Telephone Encounter (Signed)
I tried to reach [facility] and no answer. I did fax a order already this morning for this.

## 2016-11-21 DIAGNOSIS — I25119 Atherosclerotic heart disease of native coronary artery with unspecified angina pectoris: Secondary | ICD-10-CM | POA: Diagnosis not present

## 2016-11-21 DIAGNOSIS — N390 Urinary tract infection, site not specified: Secondary | ICD-10-CM | POA: Diagnosis not present

## 2016-11-21 DIAGNOSIS — E1159 Type 2 diabetes mellitus with other circulatory complications: Secondary | ICD-10-CM | POA: Diagnosis not present

## 2016-11-21 DIAGNOSIS — I739 Peripheral vascular disease, unspecified: Secondary | ICD-10-CM | POA: Diagnosis not present

## 2016-11-21 DIAGNOSIS — D638 Anemia in other chronic diseases classified elsewhere: Secondary | ICD-10-CM | POA: Diagnosis not present

## 2016-11-21 DIAGNOSIS — I209 Angina pectoris, unspecified: Secondary | ICD-10-CM | POA: Diagnosis not present

## 2016-11-21 DIAGNOSIS — F339 Major depressive disorder, recurrent, unspecified: Secondary | ICD-10-CM | POA: Diagnosis not present

## 2016-11-21 DIAGNOSIS — E46 Unspecified protein-calorie malnutrition: Secondary | ICD-10-CM | POA: Diagnosis not present

## 2016-11-21 DIAGNOSIS — F0280 Dementia in other diseases classified elsewhere without behavioral disturbance: Secondary | ICD-10-CM | POA: Diagnosis not present

## 2016-11-22 DIAGNOSIS — E46 Unspecified protein-calorie malnutrition: Secondary | ICD-10-CM | POA: Diagnosis not present

## 2016-11-22 DIAGNOSIS — N390 Urinary tract infection, site not specified: Secondary | ICD-10-CM | POA: Diagnosis not present

## 2016-11-22 DIAGNOSIS — I25119 Atherosclerotic heart disease of native coronary artery with unspecified angina pectoris: Secondary | ICD-10-CM | POA: Diagnosis not present

## 2016-11-22 DIAGNOSIS — E1159 Type 2 diabetes mellitus with other circulatory complications: Secondary | ICD-10-CM | POA: Diagnosis not present

## 2016-11-22 DIAGNOSIS — I209 Angina pectoris, unspecified: Secondary | ICD-10-CM | POA: Diagnosis not present

## 2016-11-22 DIAGNOSIS — D638 Anemia in other chronic diseases classified elsewhere: Secondary | ICD-10-CM | POA: Diagnosis not present

## 2016-11-23 DIAGNOSIS — I209 Angina pectoris, unspecified: Secondary | ICD-10-CM | POA: Diagnosis not present

## 2016-11-23 DIAGNOSIS — E1159 Type 2 diabetes mellitus with other circulatory complications: Secondary | ICD-10-CM | POA: Diagnosis not present

## 2016-11-23 DIAGNOSIS — D638 Anemia in other chronic diseases classified elsewhere: Secondary | ICD-10-CM | POA: Diagnosis not present

## 2016-11-23 DIAGNOSIS — E46 Unspecified protein-calorie malnutrition: Secondary | ICD-10-CM | POA: Diagnosis not present

## 2016-11-23 DIAGNOSIS — N390 Urinary tract infection, site not specified: Secondary | ICD-10-CM | POA: Diagnosis not present

## 2016-11-23 DIAGNOSIS — I25119 Atherosclerotic heart disease of native coronary artery with unspecified angina pectoris: Secondary | ICD-10-CM | POA: Diagnosis not present

## 2016-11-24 DIAGNOSIS — I25119 Atherosclerotic heart disease of native coronary artery with unspecified angina pectoris: Secondary | ICD-10-CM | POA: Diagnosis not present

## 2016-11-24 DIAGNOSIS — I209 Angina pectoris, unspecified: Secondary | ICD-10-CM | POA: Diagnosis not present

## 2016-11-24 DIAGNOSIS — N390 Urinary tract infection, site not specified: Secondary | ICD-10-CM | POA: Diagnosis not present

## 2016-11-24 DIAGNOSIS — E46 Unspecified protein-calorie malnutrition: Secondary | ICD-10-CM | POA: Diagnosis not present

## 2016-11-24 DIAGNOSIS — E1159 Type 2 diabetes mellitus with other circulatory complications: Secondary | ICD-10-CM | POA: Diagnosis not present

## 2016-11-24 DIAGNOSIS — D638 Anemia in other chronic diseases classified elsewhere: Secondary | ICD-10-CM | POA: Diagnosis not present

## 2016-11-26 DIAGNOSIS — I25119 Atherosclerotic heart disease of native coronary artery with unspecified angina pectoris: Secondary | ICD-10-CM | POA: Diagnosis not present

## 2016-11-26 DIAGNOSIS — D638 Anemia in other chronic diseases classified elsewhere: Secondary | ICD-10-CM | POA: Diagnosis not present

## 2016-11-26 DIAGNOSIS — I209 Angina pectoris, unspecified: Secondary | ICD-10-CM | POA: Diagnosis not present

## 2016-11-26 DIAGNOSIS — E46 Unspecified protein-calorie malnutrition: Secondary | ICD-10-CM | POA: Diagnosis not present

## 2016-11-26 DIAGNOSIS — N390 Urinary tract infection, site not specified: Secondary | ICD-10-CM | POA: Diagnosis not present

## 2016-11-26 DIAGNOSIS — E1159 Type 2 diabetes mellitus with other circulatory complications: Secondary | ICD-10-CM | POA: Diagnosis not present

## 2016-11-27 ENCOUNTER — Telehealth: Payer: Self-pay

## 2016-11-27 DIAGNOSIS — I25119 Atherosclerotic heart disease of native coronary artery with unspecified angina pectoris: Secondary | ICD-10-CM | POA: Diagnosis not present

## 2016-11-27 DIAGNOSIS — I209 Angina pectoris, unspecified: Secondary | ICD-10-CM | POA: Diagnosis not present

## 2016-11-27 DIAGNOSIS — E1159 Type 2 diabetes mellitus with other circulatory complications: Secondary | ICD-10-CM | POA: Diagnosis not present

## 2016-11-27 DIAGNOSIS — D638 Anemia in other chronic diseases classified elsewhere: Secondary | ICD-10-CM | POA: Diagnosis not present

## 2016-11-27 DIAGNOSIS — N390 Urinary tract infection, site not specified: Secondary | ICD-10-CM | POA: Diagnosis not present

## 2016-11-27 DIAGNOSIS — E46 Unspecified protein-calorie malnutrition: Secondary | ICD-10-CM | POA: Diagnosis not present

## 2016-11-27 NOTE — Telephone Encounter (Signed)
[facility] Patient Name: Carla Friedman Gender: Female DOB: 09-13-1933 Age: 83 Y 74 M 1 D Return Phone Number: [phone] (Primary) City/State/Zip: [place] Client [facility] Night - Client Client Site [facility] - Night Physician Alysia Penna - MD Who Is Calling Patient / Member / Family / Caregiver Call Type Triage / Clinical Caller Name Angela Nevin Relationship To Patient Care Giver Return Phone Number [phone] (Primary) Chief Complaint Pain - Generalized Reason for Call Symptomatic / Request for [place] from [facility] calling regarding a hospice patient who is in pain. Nurse Assessment Nurse: Richardson Landry, RN, [place] Bar Date/Time (Eastern Time): 11/25/2016 8:15:13 PM Confirm and document reason for call. If symptomatic, describe symptoms. ---Caller states pt is in an assisted living and has been declining for the past week, she has had increased pain but will not ask for pain medications. Assisted Living will only give medication if she asks for it, since it is prn they do not give it. Pt takes tramadol 1-2t;po;q6;prn pain. Caller is requesting order for scheduled dose of pain medication. Family is requesting because they will not be at bedside to request it. Caller states Assisted living can assess for pain and give tramadol as needed. RN advised per directive "Do not call for Meds after-hours". Caller states she thinks the assisted living can assess and tx the pt, the family is just requesting scheduled pain med. RN advised caller to have family contact office Monday. Caller verbalizes understanding and agrees, denies further needs at this time. ---Pt is under Hospice care and at an [facility]. Caller is hospice nurse.   Dr. Sarajane Jews - Please advise. Thanks!

## 2016-11-28 DIAGNOSIS — D638 Anemia in other chronic diseases classified elsewhere: Secondary | ICD-10-CM | POA: Diagnosis not present

## 2016-11-28 DIAGNOSIS — E1159 Type 2 diabetes mellitus with other circulatory complications: Secondary | ICD-10-CM | POA: Diagnosis not present

## 2016-11-28 DIAGNOSIS — N390 Urinary tract infection, site not specified: Secondary | ICD-10-CM | POA: Diagnosis not present

## 2016-11-28 DIAGNOSIS — E46 Unspecified protein-calorie malnutrition: Secondary | ICD-10-CM | POA: Diagnosis not present

## 2016-11-28 DIAGNOSIS — I209 Angina pectoris, unspecified: Secondary | ICD-10-CM | POA: Diagnosis not present

## 2016-11-28 DIAGNOSIS — I25119 Atherosclerotic heart disease of native coronary artery with unspecified angina pectoris: Secondary | ICD-10-CM | POA: Diagnosis not present

## 2016-11-28 NOTE — Telephone Encounter (Signed)
Please contact Hospice and ask them to manage her pain medications from now on

## 2016-11-28 NOTE — Telephone Encounter (Signed)
Left message for [facility], [facility] nurse, giving orders for Hospice physician to take over pt's pain medication management. Left our phone number for any questions. Nothing further needed at this time.

## 2016-11-29 DIAGNOSIS — I209 Angina pectoris, unspecified: Secondary | ICD-10-CM | POA: Diagnosis not present

## 2016-11-29 DIAGNOSIS — I25119 Atherosclerotic heart disease of native coronary artery with unspecified angina pectoris: Secondary | ICD-10-CM | POA: Diagnosis not present

## 2016-11-29 DIAGNOSIS — D638 Anemia in other chronic diseases classified elsewhere: Secondary | ICD-10-CM | POA: Diagnosis not present

## 2016-11-29 DIAGNOSIS — E46 Unspecified protein-calorie malnutrition: Secondary | ICD-10-CM | POA: Diagnosis not present

## 2016-11-29 DIAGNOSIS — N390 Urinary tract infection, site not specified: Secondary | ICD-10-CM | POA: Diagnosis not present

## 2016-11-29 DIAGNOSIS — E1159 Type 2 diabetes mellitus with other circulatory complications: Secondary | ICD-10-CM | POA: Diagnosis not present

## 2016-11-30 DIAGNOSIS — D638 Anemia in other chronic diseases classified elsewhere: Secondary | ICD-10-CM | POA: Diagnosis not present

## 2016-11-30 DIAGNOSIS — I25119 Atherosclerotic heart disease of native coronary artery with unspecified angina pectoris: Secondary | ICD-10-CM | POA: Diagnosis not present

## 2016-11-30 DIAGNOSIS — E46 Unspecified protein-calorie malnutrition: Secondary | ICD-10-CM | POA: Diagnosis not present

## 2016-11-30 DIAGNOSIS — N390 Urinary tract infection, site not specified: Secondary | ICD-10-CM | POA: Diagnosis not present

## 2016-11-30 DIAGNOSIS — E1159 Type 2 diabetes mellitus with other circulatory complications: Secondary | ICD-10-CM | POA: Diagnosis not present

## 2016-11-30 DIAGNOSIS — I209 Angina pectoris, unspecified: Secondary | ICD-10-CM | POA: Diagnosis not present

## 2016-12-01 DIAGNOSIS — E46 Unspecified protein-calorie malnutrition: Secondary | ICD-10-CM | POA: Diagnosis not present

## 2016-12-01 DIAGNOSIS — D638 Anemia in other chronic diseases classified elsewhere: Secondary | ICD-10-CM | POA: Diagnosis not present

## 2016-12-01 DIAGNOSIS — N390 Urinary tract infection, site not specified: Secondary | ICD-10-CM | POA: Diagnosis not present

## 2016-12-01 DIAGNOSIS — I25119 Atherosclerotic heart disease of native coronary artery with unspecified angina pectoris: Secondary | ICD-10-CM | POA: Diagnosis not present

## 2016-12-01 DIAGNOSIS — E1159 Type 2 diabetes mellitus with other circulatory complications: Secondary | ICD-10-CM | POA: Diagnosis not present

## 2016-12-01 DIAGNOSIS — I209 Angina pectoris, unspecified: Secondary | ICD-10-CM | POA: Diagnosis not present

## 2016-12-04 ENCOUNTER — Telehealth: Payer: Self-pay | Admitting: Family Medicine

## 2016-12-04 NOTE — Telephone Encounter (Signed)
Dr. Fry is aware.  

## 2016-12-04 NOTE — Telephone Encounter (Signed)
Carla Friedman with [facility] is calling to inform us that pt passed on 12/18/2016 @ 10:45 A.M. @ [facility].

## 2016-12-22 DEATH — deceased

## 2017-04-12 ENCOUNTER — Encounter: Payer: Self-pay | Admitting: Family Medicine
